# Patient Record
Sex: Female | Born: 1940 | Race: White | Hispanic: No | Smoking: Never smoker
Health system: Southern US, Community
[De-identification: ages and names within clinical notes are randomized; demographics above are authoritative.]

## PROBLEM LIST (undated history)

## (undated) DIAGNOSIS — F0390 Unspecified dementia without behavioral disturbance: Secondary | ICD-10-CM

## (undated) DIAGNOSIS — T7840XA Allergy, unspecified, initial encounter: Secondary | ICD-10-CM

## (undated) DIAGNOSIS — M1990 Unspecified osteoarthritis, unspecified site: Secondary | ICD-10-CM

## (undated) DIAGNOSIS — F419 Anxiety disorder, unspecified: Secondary | ICD-10-CM

## (undated) DIAGNOSIS — K219 Gastro-esophageal reflux disease without esophagitis: Secondary | ICD-10-CM

## (undated) HISTORY — PX: ANKLE SURGERY: SHX546

## (undated) HISTORY — PX: COLONOSCOPY: SHX5424

## (undated) HISTORY — DX: Allergy, unspecified, initial encounter: T78.40XA

## (undated) HISTORY — DX: Anxiety disorder, unspecified: F41.9

## (undated) HISTORY — DX: Unspecified osteoarthritis, unspecified site: M19.90

## (undated) HISTORY — PX: ABDOMINAL HYSTERECTOMY: SHX81

## (undated) HISTORY — PX: MULTIPLE TOOTH EXTRACTIONS: SHX2053

## (undated) HISTORY — DX: Gastro-esophageal reflux disease without esophagitis: K21.9

---

## 2012-04-05 DEATH — deceased

## 2013-09-28 DIAGNOSIS — F331 Major depressive disorder, recurrent, moderate: Secondary | ICD-10-CM | POA: Diagnosis not present

## 2013-09-28 DIAGNOSIS — F411 Generalized anxiety disorder: Secondary | ICD-10-CM | POA: Diagnosis not present

## 2014-03-06 DIAGNOSIS — F411 Generalized anxiety disorder: Secondary | ICD-10-CM | POA: Diagnosis not present

## 2014-03-06 DIAGNOSIS — F331 Major depressive disorder, recurrent, moderate: Secondary | ICD-10-CM | POA: Diagnosis not present

## 2014-06-14 DIAGNOSIS — F411 Generalized anxiety disorder: Secondary | ICD-10-CM | POA: Diagnosis not present

## 2014-06-14 DIAGNOSIS — F331 Major depressive disorder, recurrent, moderate: Secondary | ICD-10-CM | POA: Diagnosis not present

## 2014-10-25 DIAGNOSIS — F411 Generalized anxiety disorder: Secondary | ICD-10-CM | POA: Diagnosis not present

## 2014-10-25 DIAGNOSIS — F331 Major depressive disorder, recurrent, moderate: Secondary | ICD-10-CM | POA: Diagnosis not present

## 2015-03-24 ENCOUNTER — Other Ambulatory Visit: Payer: Self-pay | Admitting: Physician Assistant

## 2015-03-24 ENCOUNTER — Ambulatory Visit (INDEPENDENT_AMBULATORY_CARE_PROVIDER_SITE_OTHER): Payer: Medicare Other | Admitting: Physician Assistant

## 2015-03-24 VITALS — BP 110/60 | HR 81 | Temp 97.3°F | Resp 17 | Ht 59.25 in | Wt 155.0 lb

## 2015-03-24 DIAGNOSIS — Z8659 Personal history of other mental and behavioral disorders: Secondary | ICD-10-CM | POA: Diagnosis not present

## 2015-03-24 DIAGNOSIS — Z9149 Other personal history of psychological trauma, not elsewhere classified: Secondary | ICD-10-CM

## 2015-03-24 DIAGNOSIS — G4700 Insomnia, unspecified: Secondary | ICD-10-CM

## 2015-03-24 DIAGNOSIS — IMO0002 Reserved for concepts with insufficient information to code with codable children: Secondary | ICD-10-CM

## 2015-03-24 MED ORDER — PAROXETINE HCL 20 MG PO TABS: 20.0000 mg | ORAL_TABLET | Freq: Every day | ORAL | Status: DC

## 2015-03-24 MED ORDER — DIAZEPAM 5 MG PO TABS: 5.0000 mg | ORAL_TABLET | Freq: Every day | ORAL | Status: DC
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
## 2015-03-25 LAB — CBC
HEMATOCRIT: 43.3 % (ref 36.0–46.0)
HEMOGLOBIN: 14.6 g/dL (ref 12.0–15.0)
MCH: 29.7 pg (ref 26.0–34.0)
MCHC: 33.7 g/dL (ref 30.0–36.0)
MCV: 88.2 fL (ref 78.0–100.0)
MPV: 9.3 fL (ref 8.6–12.4)
PLATELETS: 368 10*3/uL (ref 150–400)
RBC: 4.91 MIL/uL (ref 3.87–5.11)
RDW: 14.4 % (ref 11.5–15.5)
WBC: 10.3 10*3/uL (ref 4.0–10.5)

## 2015-03-25 LAB — COMPLETE METABOLIC PANEL WITH GFR
ALT: 15 U/L (ref 6–29)
AST: 26 U/L (ref 10–35)
Albumin: 4.7 g/dL (ref 3.6–5.1)
Alkaline Phosphatase: 63 U/L (ref 33–130)
BILIRUBIN TOTAL: 0.5 mg/dL (ref 0.2–1.2)
BUN: 22 mg/dL (ref 7–25)
CALCIUM: 10.4 mg/dL (ref 8.6–10.4)
CHLORIDE: 102 mmol/L (ref 98–110)
CO2: 27 mmol/L (ref 20–31)
CREATININE: 0.97 mg/dL — AB (ref 0.60–0.93)
GFR, EST AFRICAN AMERICAN: 66 mL/min (ref >=60)
GFR, Est Non African American: 57 mL/min — ABNORMAL LOW (ref >=60)
Glucose, Bld: 166 mg/dL — ABNORMAL HIGH (ref 65–99)
Potassium: 4.9 mmol/L (ref 3.5–5.3)
Sodium: 140 mmol/L (ref 135–146)
TOTAL PROTEIN: 7.3 g/dL (ref 6.1–8.1)

## 2015-03-25 LAB — TSH: TSH: 1.168 u[IU]/mL (ref 0.350–4.500)

## 2015-03-26 LAB — HEMOGLOBIN A1C
Hgb A1c MFr Bld: 5.9 % — ABNORMAL HIGH (ref <5.7)
Mean Plasma Glucose: 123 mg/dL — ABNORMAL HIGH (ref <117)

## 2015-04-03 ENCOUNTER — Other Ambulatory Visit: Payer: Self-pay | Admitting: Physician Assistant

## 2015-04-07 ENCOUNTER — Telehealth: Payer: Self-pay | Admitting: Physician Assistant

## 2015-04-07 NOTE — Telephone Encounter (Signed)
Patient request a refill of Valium 5 MG. Patient is doubling up on the medication. Patient have a follow up appointment with Philis Fendt. [facility] [address]. [phone]

## 2015-04-08 NOTE — Telephone Encounter (Signed)
History of depression:  - Discontinue: PARoxetine (PAXIL) 20 MG tablet; Take 1 tablet (20 mg total) by mouth at bedtime. - Ambulatory referral to Psychiatry  Legrand Como I am not sure if it was ok for pt to double up on her medication. Your notes do not support that. Please advise.

## 2015-04-08 NOTE — Telephone Encounter (Signed)
Please call patient back.  I will refill her Valium tonight while I am at the office.  We need to try and wean her off of the valium and try Belsomra. I will refill the Valium 5 mg and this is to be taken during the day so it will not interfere starting Belsomra.  I was hoping that she would have gotten in with psychiatry by now.

## 2015-04-09 ENCOUNTER — Other Ambulatory Visit: Payer: Self-pay | Admitting: Physician Assistant

## 2015-04-09 DIAGNOSIS — G4700 Insomnia, unspecified: Secondary | ICD-10-CM

## 2015-04-09 MED ORDER — DIAZEPAM 5 MG PO TABS: ORAL_TABLET | ORAL | Status: DC

## 2015-04-09 MED ORDER — SUVOREXANT 15 MG PO TABS: 15.0000 mg | ORAL_TABLET | Freq: Every day | ORAL | Status: DC

## 2015-04-09 NOTE — Telephone Encounter (Signed)
Left message for pt to call back  °

## 2015-04-09 NOTE — Progress Notes (Signed)
Stopping Valium, starting Belsomra.

## 2015-04-09 NOTE — Telephone Encounter (Signed)
Patient's grandson, Miguel Aschoff, returned phone call. There is no HIPPA on file therefore I could not disclose any information to patient's grandson.   [phone]

## 2015-04-12 NOTE — Telephone Encounter (Signed)
Part D does not cover patients medication Balsama and diazepam (VALIUM) 5 MG tablet   [phone]  [name] son

## 2015-04-15 ENCOUNTER — Other Ambulatory Visit: Payer: Self-pay | Admitting: Physician Assistant

## 2015-04-15 MED ORDER — ESZOPICLONE 1 MG PO TABS: 1.0000 mg | ORAL_TABLET | Freq: Every day | ORAL | Status: DC

## 2015-04-15 NOTE — Progress Notes (Signed)
Insurance will not pay for Belsomra.  Will write for Lunesta.  Philis Fendt, MS, PA-C   8:46 AM, 04/15/2015

## 2015-04-15 NOTE — Telephone Encounter (Signed)
Please call patient back. I have written for Lunesta and will be by 102 to sign this around 1 pm.  Stopping the Valium and Belsomra for now.  Philis Fendt, MS, PA-C   8:59 AM, 04/15/2015

## 2015-04-15 NOTE — Telephone Encounter (Signed)
Pt's son notified and rx faxed to pharm

## 2015-04-24 ENCOUNTER — Ambulatory Visit: Payer: Medicare Other | Admitting: Physician Assistant

## 2015-05-06 DIAGNOSIS — F3342 Major depressive disorder, recurrent, in full remission: Secondary | ICD-10-CM | POA: Diagnosis not present

## 2015-06-17 DIAGNOSIS — F3342 Major depressive disorder, recurrent, in full remission: Secondary | ICD-10-CM | POA: Diagnosis not present

## 2015-09-10 DIAGNOSIS — M1711 Unilateral primary osteoarthritis, right knee: Secondary | ICD-10-CM | POA: Diagnosis not present

## 2015-09-10 DIAGNOSIS — M7062 Trochanteric bursitis, left hip: Secondary | ICD-10-CM | POA: Diagnosis not present

## 2015-10-14 DIAGNOSIS — F3342 Major depressive disorder, recurrent, in full remission: Secondary | ICD-10-CM | POA: Diagnosis not present

## 2015-11-05 DIAGNOSIS — M25511 Pain in right shoulder: Secondary | ICD-10-CM | POA: Diagnosis not present

## 2015-11-18 DIAGNOSIS — M25511 Pain in right shoulder: Secondary | ICD-10-CM | POA: Diagnosis not present

## 2015-11-27 DIAGNOSIS — M25511 Pain in right shoulder: Secondary | ICD-10-CM | POA: Diagnosis not present

## 2015-12-04 DIAGNOSIS — M25511 Pain in right shoulder: Secondary | ICD-10-CM | POA: Diagnosis not present

## 2016-01-01 DIAGNOSIS — M25511 Pain in right shoulder: Secondary | ICD-10-CM | POA: Diagnosis not present

## 2016-01-14 DIAGNOSIS — M1711 Unilateral primary osteoarthritis, right knee: Secondary | ICD-10-CM | POA: Diagnosis not present

## 2016-04-03 DIAGNOSIS — F3342 Major depressive disorder, recurrent, in full remission: Secondary | ICD-10-CM | POA: Diagnosis not present

## 2016-04-07 ENCOUNTER — Encounter (HOSPITAL_COMMUNITY): Payer: Self-pay

## 2016-04-07 ENCOUNTER — Encounter (HOSPITAL_COMMUNITY)
Admission: RE | Admit: 2016-04-07 | Discharge: 2016-04-07 | Disposition: A | Discharge status: Home / Self Care | Payer: Medicare Other | Source: Ambulatory Visit | Attending: Orthopedic Surgery | Admitting: Orthopedic Surgery

## 2016-04-07 DIAGNOSIS — Z79899 Other long term (current) drug therapy: Secondary | ICD-10-CM | POA: Diagnosis not present

## 2016-04-07 DIAGNOSIS — Z90710 Acquired absence of both cervix and uterus: Secondary | ICD-10-CM | POA: Diagnosis not present

## 2016-04-07 DIAGNOSIS — M129 Arthropathy, unspecified: Secondary | ICD-10-CM | POA: Diagnosis not present

## 2016-04-07 DIAGNOSIS — M75101 Unspecified rotator cuff tear or rupture of right shoulder, not specified as traumatic: Secondary | ICD-10-CM | POA: Diagnosis not present

## 2016-04-07 LAB — SURGICAL PCR SCREEN
MRSA, PCR: NEGATIVE
Staphylococcus aureus: POSITIVE — AB

## 2016-04-07 LAB — BASIC METABOLIC PANEL
ANION GAP: 8 (ref 5–15)
BUN: 19 mg/dL (ref 6–20)
CHLORIDE: 104 mmol/L (ref 101–111)
CO2: 27 mmol/L (ref 22–32)
Calcium: 9.4 mg/dL (ref 8.9–10.3)
Creatinine, Ser: 0.83 mg/dL (ref 0.44–1.00)
GFR calc non Af Amer: >60 mL/min (ref >60)
Glucose, Bld: 97 mg/dL (ref 65–99)
POTASSIUM: 3.7 mmol/L (ref 3.5–5.1)
SODIUM: 139 mmol/L (ref 135–145)

## 2016-04-07 LAB — CBC
HEMATOCRIT: 39.8 % (ref 36.0–46.0)
HEMOGLOBIN: 12.7 g/dL (ref 12.0–15.0)
MCH: 29.1 pg (ref 26.0–34.0)
MCHC: 31.9 g/dL (ref 30.0–36.0)
MCV: 91.1 fL (ref 78.0–100.0)
PLATELETS: 265 10*3/uL (ref 150–400)
RBC: 4.37 MIL/uL (ref 3.87–5.11)
RDW: 13.8 % (ref 11.5–15.5)
WBC: 8.3 10*3/uL (ref 4.0–10.5)

## 2016-04-07 NOTE — Pre-Procedure Instructions (Signed)
Lenola A Cannell  04/07/2016      [facility], [address] [place] [address] Phone: [phone] Fax: [phone]    Your procedure is scheduled on October 5  Report to [facility] at [place].M.  Call this number if you have problems the morning of surgery:  [phone]   Remember:  Do not eat food or drink liquids after midnight.   Take these medicines the morning of surgery with A SIP OF WATER busPIRone (BUSPAR) If needed  7 days prior to surgery STOP taking any Aspirin, Aleve, Naproxen, Ibuprofen, Motrin, Advil, Goody's, BC's, all herbal medications, fish oil, and all vitamins    Do not wear jewelry, make-up or nail polish.  Do not wear lotions, powders, or perfumes, or deoderant.  Do not shave 48 hours prior to surgery.    Do not bring valuables to the hospital.  [facility] is not responsible for any belongings or valuables.  Contacts, dentures or bridgework may not be worn into surgery.  Leave your suitcase in the car.  After surgery it may be brought to your room.  For patients admitted to the hospital, discharge time will be determined by your treatment team.  Patients discharged the day of surgery will not be allowed to drive home.    Special instructions:   [facility]- Preparing For Surgery  Before surgery, you can play an important role. Because skin is not sterile, your skin needs to be as free of germs as possible. You can reduce the number of germs on your skin by washing with CHG (chlorahexidine gluconate) Soap before surgery.  CHG is an antiseptic cleaner which kills germs and bonds with the skin to continue killing germs even after washing.  Please do not use if you have an allergy to CHG or antibacterial soaps. If your skin becomes reddened/irritated stop using the CHG.  Do not shave (including legs and underarms) for at least 48 hours prior to first CHG  shower. It is OK to shave your face.  Please follow these instructions carefully.   1. Shower the NIGHT BEFORE SURGERY and the MORNING OF SURGERY with CHG.   2. If you chose to wash your hair, wash your hair first as usual with your normal shampoo.  3. After you shampoo, rinse your hair and body thoroughly to remove the shampoo.  4. Use CHG as you would any other liquid soap. You can apply CHG directly to the skin and wash gently with a scrungie or a clean washcloth.   5. Apply the CHG Soap to your body ONLY FROM THE NECK DOWN.  Do not use on open wounds or open sores. Avoid contact with your eyes, ears, mouth and genitals (private parts). Wash genitals (private parts) with your normal soap.  6. Wash thoroughly, paying special attention to the area where your surgery will be performed.  7. Thoroughly rinse your body with warm water from the neck down.  8. DO NOT shower/wash with your normal soap after using and rinsing off the CHG Soap.  9. Pat yourself dry with a CLEAN TOWEL.   10. Wear CLEAN PAJAMAS   11. Place CLEAN SHEETS on your bed the night of your first shower and DO NOT SLEEP WITH PETS.    Day of Surgery: Do not apply any deodorants/lotions. Please wear clean clothes to the hospital/surgery center.      Please read over the following  fact sheets that you were given. Coughing and Deep Breathing, Total Joint Packet, MRSA Information and Surgical Site Infection Prevention

## 2016-04-07 NOTE — Progress Notes (Signed)
Prescription called in at [facility] [phone], patient called and notified of positive staph result

## 2016-04-07 NOTE — Progress Notes (Signed)
PCP - denies Cardiologist -denies   Chest x-ray - not needed EKG - 04/07/16 Stress Test - denies ECHO - denies Cardiac Cath - denies    Patient denies shortness of breath, fever, cough and chest pain at PAT appointment

## 2016-04-08 MED ORDER — SODIUM CHLORIDE 0.9 % IV SOLN
1000.0000 mg | INTRAVENOUS | Status: AC
  Administered 2016-04-09: 1000 mg via INTRAVENOUS
  Filled 2016-04-08 (×2): qty 10

## 2016-04-08 NOTE — Anesthesia Preprocedure Evaluation (Signed)
Anesthesia Evaluation  Patient identified by MRN, date of birth, ID band Patient awake    Reviewed: Allergy & Precautions, NPO status , Patient's Chart, lab work & pertinent test results  Airway Mallampati: II  TM Distance: >3 FB Neck ROM: Full    Dental no notable dental hx.    Pulmonary neg pulmonary ROS,    Pulmonary exam normal breath sounds clear to auscultation       Cardiovascular negative cardio ROS Normal cardiovascular exam Rhythm:Regular Rate:Normal     Neuro/Psych PSYCHIATRIC DISORDERS Anxiety negative neurological ROS     GI/Hepatic Neg liver ROS, GERD  ,  Endo/Other  negative endocrine ROS  Renal/GU negative Renal ROS     Musculoskeletal  (+) Arthritis ,   Abdominal   Peds  Hematology negative hematology ROS (+)   Anesthesia Other Findings   Reproductive/Obstetrics negative OB ROS                             Anesthesia Physical Anesthesia Plan  ASA: II  Anesthesia Plan: General and Regional   Post-op Pain Management: GA combined w/ Regional for post-op pain   Induction: Intravenous  Airway Management Planned: Oral ETT  Additional Equipment:   Intra-op Plan:   Post-operative Plan: Extubation in OR  Informed Consent: I have reviewed the patients History and Physical, chart, labs and discussed the procedure including the risks, benefits and alternatives for the proposed anesthesia with the patient or authorized representative who has indicated his/her understanding and acceptance.   Dental advisory given  Plan Discussed with: CRNA  Anesthesia Plan Comments:         Anesthesia Quick Evaluation

## 2016-04-09 ENCOUNTER — Encounter (HOSPITAL_COMMUNITY)
Admission: RE | Disposition: A | Discharge status: Home / Self Care | Payer: Self-pay | Source: Ambulatory Visit | Attending: Orthopedic Surgery

## 2016-04-09 ENCOUNTER — Inpatient Hospital Stay (HOSPITAL_COMMUNITY): Payer: Medicare Other | Admitting: Anesthesiology

## 2016-04-09 ENCOUNTER — Inpatient Hospital Stay (HOSPITAL_COMMUNITY)
Admission: RE | Admit: 2016-04-09 | Discharge: 2016-04-10 | DRG: 483 | Disposition: A | Discharge status: Home / Self Care | Payer: Medicare Other | Source: Ambulatory Visit | Attending: Orthopedic Surgery | Admitting: Orthopedic Surgery

## 2016-04-09 ENCOUNTER — Encounter (HOSPITAL_COMMUNITY): Payer: Self-pay | Admitting: *Deleted

## 2016-04-09 DIAGNOSIS — Z79899 Other long term (current) drug therapy: Secondary | ICD-10-CM | POA: Diagnosis not present

## 2016-04-09 DIAGNOSIS — M129 Arthropathy, unspecified: Secondary | ICD-10-CM | POA: Diagnosis not present

## 2016-04-09 DIAGNOSIS — K219 Gastro-esophageal reflux disease without esophagitis: Secondary | ICD-10-CM | POA: Diagnosis not present

## 2016-04-09 DIAGNOSIS — Z96611 Presence of right artificial shoulder joint: Secondary | ICD-10-CM

## 2016-04-09 DIAGNOSIS — M75101 Unspecified rotator cuff tear or rupture of right shoulder, not specified as traumatic: Principal | ICD-10-CM | POA: Diagnosis present

## 2016-04-09 DIAGNOSIS — M12811 Other specific arthropathies, not elsewhere classified, right shoulder: Secondary | ICD-10-CM | POA: Diagnosis not present

## 2016-04-09 DIAGNOSIS — Z90710 Acquired absence of both cervix and uterus: Secondary | ICD-10-CM

## 2016-04-09 DIAGNOSIS — M1990 Unspecified osteoarthritis, unspecified site: Secondary | ICD-10-CM | POA: Diagnosis not present

## 2016-04-09 HISTORY — PX: REVERSE SHOULDER ARTHROPLASTY: SHX5054

## 2016-04-09 SURGERY — ARTHROPLASTY, SHOULDER, TOTAL, REVERSE
Anesthesia: Regional | Site: Shoulder | Laterality: Right

## 2016-04-09 MED ORDER — MENTHOL 3 MG MT LOZG: 1.0000 | LOZENGE | OROMUCOSAL | Status: DC | PRN

## 2016-04-09 MED ORDER — FENTANYL CITRATE (PF) 100 MCG/2ML IJ SOLN
INTRAMUSCULAR | Status: AC
  Filled 2016-04-09: qty 4

## 2016-04-09 MED ORDER — METOCLOPRAMIDE HCL 5 MG PO TABS: 5.0000 mg | ORAL_TABLET | Freq: Three times a day (TID) | ORAL | Status: DC | PRN

## 2016-04-09 MED ORDER — BUPIVACAINE-EPINEPHRINE (PF) 0.5% -1:200000 IJ SOLN
INTRAMUSCULAR | Status: DC | PRN
  Administered 2016-04-09: 30 mL via PERINEURAL

## 2016-04-09 MED ORDER — KETOROLAC TROMETHAMINE 15 MG/ML IJ SOLN
7.5000 mg | Freq: Four times a day (QID) | INTRAMUSCULAR | Status: AC
  Administered 2016-04-09 – 2016-04-10 (×4): 7.5 mg via INTRAVENOUS
  Filled 2016-04-09 (×4): qty 1

## 2016-04-09 MED ORDER — ALUM & MAG HYDROXIDE-SIMETH 200-200-20 MG/5ML PO SUSP: 30.0000 mL | ORAL | Status: DC | PRN

## 2016-04-09 MED ORDER — POLYETHYLENE GLYCOL 3350 17 G PO PACK: 17.0000 g | PACK | Freq: Every day | ORAL | Status: DC | PRN

## 2016-04-09 MED ORDER — CEFAZOLIN SODIUM-DEXTROSE 2-4 GM/100ML-% IV SOLN
2.0000 g | INTRAVENOUS | Status: AC
  Administered 2016-04-09: 2 g via INTRAVENOUS

## 2016-04-09 MED ORDER — ONDANSETRON HCL 4 MG PO TABS: 4.0000 mg | ORAL_TABLET | Freq: Four times a day (QID) | ORAL | Status: DC | PRN

## 2016-04-09 MED ORDER — EPHEDRINE SULFATE 50 MG/ML IJ SOLN
INTRAMUSCULAR | Status: DC | PRN
Start: 2016-04-09 — End: 2016-04-09
  Administered 2016-04-09: 5 mg via INTRAVENOUS

## 2016-04-09 MED ORDER — ACETAMINOPHEN 325 MG PO TABS: 650.0000 mg | ORAL_TABLET | Freq: Four times a day (QID) | ORAL | Status: DC | PRN

## 2016-04-09 MED ORDER — CEFAZOLIN SODIUM-DEXTROSE 2-4 GM/100ML-% IV SOLN
2.0000 g | Freq: Four times a day (QID) | INTRAVENOUS | Status: AC
  Administered 2016-04-09 – 2016-04-10 (×3): 2 g via INTRAVENOUS
  Filled 2016-04-09 (×4): qty 100

## 2016-04-09 MED ORDER — FENTANYL CITRATE (PF) 100 MCG/2ML IJ SOLN
INTRAMUSCULAR | Status: DC | PRN
  Administered 2016-04-09: 50 ug via INTRAVENOUS

## 2016-04-09 MED ORDER — PAROXETINE HCL 20 MG PO TABS
20.0000 mg | ORAL_TABLET | Freq: Every evening | ORAL | Status: DC
  Administered 2016-04-09: 20 mg via ORAL
  Filled 2016-04-09: qty 1

## 2016-04-09 MED ORDER — PROPOFOL 10 MG/ML IV BOLUS
INTRAVENOUS | Status: AC
  Filled 2016-04-09: qty 20

## 2016-04-09 MED ORDER — OXYCODONE HCL 5 MG PO TABS
5.0000 mg | ORAL_TABLET | ORAL | Status: DC | PRN
  Administered 2016-04-10 (×2): 10 mg via ORAL
  Filled 2016-04-09 (×2): qty 2

## 2016-04-09 MED ORDER — NEOSTIGMINE METHYLSULFATE 10 MG/10ML IV SOLN
INTRAVENOUS | Status: DC | PRN
  Administered 2016-04-09: 4 mg via INTRAVENOUS

## 2016-04-09 MED ORDER — 0.9 % SODIUM CHLORIDE (POUR BTL) OPTIME
TOPICAL | Status: DC | PRN
  Administered 2016-04-09: 1000 mL

## 2016-04-09 MED ORDER — GLYCOPYRROLATE 0.2 MG/ML IJ SOLN
INTRAMUSCULAR | Status: DC | PRN
  Administered 2016-04-09: 0.6 mg via INTRAVENOUS

## 2016-04-09 MED ORDER — PHENYLEPHRINE HCL 10 MG/ML IJ SOLN
INTRAVENOUS | Status: DC | PRN
  Administered 2016-04-09: 25 ug/min via INTRAVENOUS

## 2016-04-09 MED ORDER — ROCURONIUM BROMIDE 100 MG/10ML IV SOLN
INTRAVENOUS | Status: DC | PRN
  Administered 2016-04-09: 50 mg via INTRAVENOUS

## 2016-04-09 MED ORDER — DOCUSATE SODIUM 100 MG PO CAPS
100.0000 mg | ORAL_CAPSULE | Freq: Two times a day (BID) | ORAL | Status: DC
  Administered 2016-04-09 – 2016-04-10 (×2): 100 mg via ORAL
  Filled 2016-04-09 (×3): qty 1

## 2016-04-09 MED ORDER — CHLORHEXIDINE GLUCONATE 4 % EX LIQD: 60.0000 mL | Freq: Once | CUTANEOUS | Status: DC

## 2016-04-09 MED ORDER — DEXAMETHASONE SODIUM PHOSPHATE 10 MG/ML IJ SOLN
INTRAMUSCULAR | Status: DC | PRN
  Administered 2016-04-09: 10 mg via INTRAVENOUS

## 2016-04-09 MED ORDER — METOCLOPRAMIDE HCL 5 MG/ML IJ SOLN: 5.0000 mg | Freq: Three times a day (TID) | INTRAMUSCULAR | Status: DC | PRN

## 2016-04-09 MED ORDER — MAGNESIUM CITRATE PO SOLN: 1.0000 | Freq: Once | ORAL | Status: DC | PRN

## 2016-04-09 MED ORDER — ACETAMINOPHEN 650 MG RE SUPP: 650.0000 mg | Freq: Four times a day (QID) | RECTAL | Status: DC | PRN

## 2016-04-09 MED ORDER — BUSPIRONE HCL 10 MG PO TABS: 10.0000 mg | ORAL_TABLET | Freq: Three times a day (TID) | ORAL | Status: DC | PRN

## 2016-04-09 MED ORDER — CEFAZOLIN SODIUM-DEXTROSE 2-4 GM/100ML-% IV SOLN
INTRAVENOUS | Status: AC
  Filled 2016-04-09: qty 100

## 2016-04-09 MED ORDER — LACTATED RINGERS IV SOLN
INTRAVENOUS | Status: DC | PRN
  Administered 2016-04-09: 07:00:00 via INTRAVENOUS

## 2016-04-09 MED ORDER — LACTATED RINGERS IV SOLN
INTRAVENOUS | Status: DC
  Administered 2016-04-09: 13:00:00 via INTRAVENOUS

## 2016-04-09 MED ORDER — HYDROMORPHONE HCL 1 MG/ML IJ SOLN: 1.0000 mg | INTRAMUSCULAR | Status: DC | PRN

## 2016-04-09 MED ORDER — ONDANSETRON HCL 4 MG/2ML IJ SOLN
INTRAMUSCULAR | Status: DC | PRN
  Administered 2016-04-09: 4 mg via INTRAVENOUS

## 2016-04-09 MED ORDER — BISACODYL 5 MG PO TBEC: 5.0000 mg | DELAYED_RELEASE_TABLET | Freq: Every day | ORAL | Status: DC | PRN

## 2016-04-09 MED ORDER — DIAZEPAM 5 MG PO TABS
5.0000 mg | ORAL_TABLET | Freq: Four times a day (QID) | ORAL | Status: DC | PRN
  Administered 2016-04-10: 5 mg via ORAL
  Filled 2016-04-09: qty 1

## 2016-04-09 MED ORDER — PHENOL 1.4 % MT LIQD: 1.0000 | OROMUCOSAL | Status: DC | PRN

## 2016-04-09 MED ORDER — PROPOFOL 10 MG/ML IV BOLUS
INTRAVENOUS | Status: DC | PRN
  Administered 2016-04-09: 150 mg via INTRAVENOUS

## 2016-04-09 MED ORDER — MIDAZOLAM HCL 5 MG/5ML IJ SOLN
INTRAMUSCULAR | Status: DC | PRN
  Administered 2016-04-09 (×2): 1 mg via INTRAVENOUS

## 2016-04-09 MED ORDER — ONDANSETRON HCL 4 MG/2ML IJ SOLN: 4.0000 mg | Freq: Four times a day (QID) | INTRAMUSCULAR | Status: DC | PRN

## 2016-04-09 MED ORDER — MIDAZOLAM HCL 2 MG/2ML IJ SOLN
INTRAMUSCULAR | Status: AC
  Filled 2016-04-09: qty 2

## 2016-04-09 SURGICAL SUPPLY — 66 items
BASEPLATE GLENOID SHLDR SM (Shoulder) ×3 IMPLANT
BLADE SAW SGTL 83.5X18.5 (BLADE) ×3 IMPLANT
COVER SURGICAL LIGHT HANDLE (MISCELLANEOUS) ×3 IMPLANT
CUP SUT UNIV REVERS 36+2 RT (Cup) ×3 IMPLANT
DERMABOND ADVANCED (GAUZE/BANDAGES/DRESSINGS) ×2
DERMABOND ADVANCED .7 DNX12 (GAUZE/BANDAGES/DRESSINGS) ×1 IMPLANT
DRAPE ORTHO SPLIT 77X108 STRL (DRAPES) ×4
DRAPE SURG 17X11 SM STRL (DRAPES) ×3 IMPLANT
DRAPE SURG ORHT 6 SPLT 77X108 (DRAPES) ×2 IMPLANT
DRAPE U-SHAPE 47X51 STRL (DRAPES) ×3 IMPLANT
DRSG AQUACEL AG ADV 3.5X10 (GAUZE/BANDAGES/DRESSINGS) ×3 IMPLANT
DURAPREP 26ML APPLICATOR (WOUND CARE) ×3 IMPLANT
ELECT BLADE 4.0 EZ CLEAN MEGAD (MISCELLANEOUS) ×3
ELECT CAUTERY BLADE 6.4 (BLADE) ×3 IMPLANT
ELECT REM PT RETURN 9FT ADLT (ELECTROSURGICAL) ×3
ELECTRODE BLDE 4.0 EZ CLN MEGD (MISCELLANEOUS) ×1 IMPLANT
ELECTRODE REM PT RTRN 9FT ADLT (ELECTROSURGICAL) ×1 IMPLANT
FACESHIELD WRAPAROUND (MASK) ×9 IMPLANT
GLENOSPHERE LATERAL 36MM+4 (Shoulder) ×3 IMPLANT
GLOVE BIO SURGEON STRL SZ7.5 (GLOVE) ×3 IMPLANT
GLOVE BIO SURGEON STRL SZ8 (GLOVE) ×3 IMPLANT
GLOVE EUDERMIC 7 POWDERFREE (GLOVE) ×3 IMPLANT
GLOVE SS BIOGEL STRL SZ 7.5 (GLOVE) ×1 IMPLANT
GLOVE SUPERSENSE BIOGEL SZ 7.5 (GLOVE) ×2
GOWN STRL REUS W/ TWL LRG LVL3 (GOWN DISPOSABLE) IMPLANT
GOWN STRL REUS W/ TWL XL LVL3 (GOWN DISPOSABLE) ×2 IMPLANT
GOWN STRL REUS W/TWL LRG LVL3 (GOWN DISPOSABLE)
GOWN STRL REUS W/TWL XL LVL3 (GOWN DISPOSABLE) ×4
KIT BASIN OR (CUSTOM PROCEDURE TRAY) ×3 IMPLANT
KIT ROOM TURNOVER OR (KITS) ×3 IMPLANT
LINER HUMERAL 36 +3MM SM (Shoulder) ×3 IMPLANT
MANIFOLD NEPTUNE II (INSTRUMENTS) ×3 IMPLANT
NDL SUT .5 MAYO 1.404X.05X (NEEDLE) ×1 IMPLANT
NEEDLE HYPO 25GX1X1/2 BEV (NEEDLE) IMPLANT
NEEDLE MAYO TAPER (NEEDLE) ×2
NEEDLE MAYO TROCAR (NEEDLE) ×3 IMPLANT
NS IRRIG 1000ML POUR BTL (IV SOLUTION) ×3 IMPLANT
PACK SHOULDER (CUSTOM PROCEDURE TRAY) ×3 IMPLANT
PAD ARMBOARD 7.5X6 YLW CONV (MISCELLANEOUS) ×6 IMPLANT
PASSER SUT SWANSON 36MM LOOP (INSTRUMENTS) IMPLANT
RESTRAINT HEAD UNIVERSAL NS (MISCELLANEOUS) ×3 IMPLANT
SCREW CENTRAL NONLOCK 6.5X20MM (Shoulder) ×3 IMPLANT
SCREW LOCK GLENOID UNI PERI (Screw) ×3 IMPLANT
SCREW LOCK PERIPHERAL 30MM (Shoulder) ×3 IMPLANT
SET PIN UNIVERSAL REVERSE (SET/KITS/TRAYS/PACK) ×3 IMPLANT
SLING ARM FOAM STRAP LRG (SOFTGOODS) ×3 IMPLANT
SPACER SHLD UNI REV 36 +6 (Shoulder) ×2 IMPLANT
SPACER TI 36/+6MM (Shoulder) ×1 IMPLANT
SPONGE LAP 18X18 X RAY DECT (DISPOSABLE) ×3 IMPLANT
SPONGE LAP 4X18 X RAY DECT (DISPOSABLE) ×3 IMPLANT
STEM REVERS UNIVERS SZ7 CAP (Shoulder) ×3 IMPLANT
SUCTION FRAZIER HANDLE 10FR (MISCELLANEOUS) ×2
SUCTION TUBE FRAZIER 10FR DISP (MISCELLANEOUS) ×1 IMPLANT
SUT BONE WAX W31G (SUTURE) IMPLANT
SUT FIBERWIRE #2 38 T-5 BLUE (SUTURE) ×6
SUT MNCRL AB 3-0 PS2 18 (SUTURE) ×3 IMPLANT
SUT MON AB 2-0 CT1 36 (SUTURE) ×3 IMPLANT
SUT VIC AB 1 CT1 27 (SUTURE) ×2
SUT VIC AB 1 CT1 27XBRD ANBCTR (SUTURE) ×1 IMPLANT
SUT VIC AB 2-0 CT1 27 (SUTURE)
SUT VIC AB 2-0 CT1 TAPERPNT 27 (SUTURE) IMPLANT
SUTURE FIBERWR #2 38 T-5 BLUE (SUTURE) ×2 IMPLANT
SYR CONTROL 10ML LL (SYRINGE) IMPLANT
TOWEL OR 17X24 6PK STRL BLUE (TOWEL DISPOSABLE) ×3 IMPLANT
TOWEL OR 17X26 10 PK STRL BLUE (TOWEL DISPOSABLE) ×3 IMPLANT
WATER STERILE IRR 1000ML POUR (IV SOLUTION) IMPLANT

## 2016-04-09 NOTE — Anesthesia Postprocedure Evaluation (Signed)
Anesthesia Post Note  Patient: Courtney Avila  Procedure(s) Performed: Procedure(s) (LRB): RIGHT REVERSE SHOULDER ARTHROPLASTY (Right)  Patient location during evaluation: PACU Anesthesia Type: General and Regional Level of consciousness: sedated and patient cooperative Pain management: pain level controlled Vital Signs Assessment: post-procedure vital signs reviewed and stable Respiratory status: spontaneous breathing Cardiovascular status: stable Anesthetic complications: no    Last Vitals:  Vitals:   04/09/16 1056 04/09/16 1100  BP:  (!) 110/98  Pulse: 72 (!) 55  Resp: 12 16  Temp:  36.7 C    Last Pain:  Vitals:   04/09/16 1100  TempSrc:   PainSc: 0-No pain                 Nolon Nations

## 2016-04-09 NOTE — Anesthesia Procedure Notes (Signed)
Anesthesia Regional Block:  Interscalene brachial plexus block  Pre-Anesthetic Checklist: ,, timeout performed, Correct Patient, Correct Site, Correct Laterality, Correct Procedure, Correct Position, site marked, Risks and benefits discussed, Surgical consent,  Pre-op evaluation,  Post-op pain management  Laterality: Right  Prep: chloraprep       Needles:  Injection technique: Single-shot  Needle Type: Stimiplex      Needle Gauge: 21 G    Additional Needles:  Procedures: ultrasound guided (picture in chart) and nerve stimulator Interscalene brachial plexus block  Nerve Stimulator or Paresthesia:  Response: Deltoid, 0.5 mA,   Additional Responses:   Narrative:   Performed by: Personally   Additional Notes: Patient tolerated the procedure well without complications. Good perineural spread.

## 2016-04-09 NOTE — Transfer of Care (Signed)
Immediate Anesthesia Transfer of Care Note  Patient: Courtney Avila  Procedure(s) Performed: Procedure(s): RIGHT REVERSE SHOULDER ARTHROPLASTY (Right)  Patient Location: PACU  Anesthesia Type:GA combined with regional for post-op pain  Level of Consciousness: awake, alert , oriented and patient cooperative  Airway & Oxygen Therapy: Patient Spontanous Breathing and Patient connected to nasal cannula oxygen  Post-op Assessment: Report given to RN and Post -op Vital signs reviewed and stable  Post vital signs: Reviewed and stable  Last Vitals:  Vitals:   04/09/16 0625 04/09/16 0929  BP: (!) 159/61   Pulse: 66 86  Resp: 18   Temp: 36.5 C 36.6 C    Last Pain:  Vitals:   04/09/16 0625  TempSrc: Oral         Complications: No apparent anesthesia complications

## 2016-04-09 NOTE — H&P (Signed)
Auriel A Avitia    Chief Complaint: Right Shoulder Rotator Tear Arthropathy HPI: The patient is a 75 y.o. 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 Allergy   . Anxiety   . Arthritis   . GERD (gastroesophageal reflux disease)     Past Surgical History:  Procedure Laterality Date  . ABDOMINAL HYSTERECTOMY    . ANKLE SURGERY     growth lipoma removed  . BACK SURGERY    . COLONOSCOPY    . MULTIPLE TOOTH EXTRACTIONS      Family History  Problem Relation Age of Onset  . Diabetes Mother   . Hypertension Mother   . Cancer Father   . Heart disease Father   . Cancer Sister     Social History:  reports that she has never smoked. She has never used smokeless tobacco. She reports that she does not drink alcohol or use drugs.   Medications Prior to Admission  Medication Sig Dispense Refill  . busPIRone (BUSPAR) 10 MG tablet Take 10 mg by mouth 3 (three) times daily as needed (for anxiety).     . Multiple Vitamin (MULTIVITAMIN WITH MINERALS) TABS tablet Take 1 tablet by mouth daily. Centrum Silver.    . Multiple Vitamins-Minerals (HAIR/SKIN/NAILS/BIOTIN) TABS Take 1 tablet by mouth daily.    . Omega-3 Fatty Acids (FISH OIL) 1000 MG CAPS Take 1,000 mg by mouth daily.    Marland Kitchen PARoxetine (PAXIL) 20 MG tablet Take 20 mg by mouth every evening.     . temazepam (RESTORIL) 15 MG capsule Take 30 mg by mouth at bedtime.        Physical Exam: right shoulder with painful and restricted motion as noted at recent office visits  Vitals  Temp:  [97.7 F (36.5 C)] 97.7 F (36.5 C) (10/05 0625) Pulse Rate:  [66] 66 (10/05 0625) Resp:  [18] 18 (10/05 0625) BP: (159)/(61) 159/61 (10/05 0625) SpO2:  [97 %] 97 % (10/05 0625) Weight:  [69.9 kg (154 lb)] 69.9 kg (154 lb) (10/05 0625)  Assessment/Plan  Impression: Right Shoulder Rotator Tear Arthropathy  Plan of Action: Procedure(s): RIGHT REVERSE SHOULDER ARTHROPLASTY  Kayen Grabel M  Desarai Barrack 04/09/2016, 7:22 AM Contact # [phone]

## 2016-04-09 NOTE — Discharge Instructions (Signed)
° °  Metta Clines. Supple, M.D., F.A.A.O.S. Orthopaedic Surgery Specializing in Arthroscopic and Reconstructive Surgery of the Shoulder and Knee [phone] [address] [place], [place] [address] - Fax [phone]   POST-OP REVERSE SHOULDER REPLACEMENT INSTRUCTIONS  1. Call the office at [phone] to schedule your first post-op appointment 10-14 days from the date of your surgery.  2. The bandage over your incision is waterproof. You may begin showering with this dressing on. You may leave this dressing on until first follow up appointment within 2 weeks. We prefer you leave this dressing in place until follow up however after 5-7 days if you are having itching or skin irritation and would like to remove it you may do so. Go slow and tug at the borders gently to break the bond the dressing has with the skin. At this point if there is no drainage it is okay to go without a bandage or you may cover it with a light guaze and tape. You can also expect significant bruising around your shoulder that will drift down your arm and into your chest wall. This is very normal and should resolve over several days.   3. Wear your sling/immobilizer at all times except to perform the exercises below or to occasionally let your arm dangle by your side to stretch your elbow. You also need to sleep in your sling immobilizer until instructed otherwise.  4. Range of motion to your elbow, wrist, and hand are encouraged 3-5 times daily. Exercise to your hand and fingers helps to reduce swelling you may experience.  5. Utilize ice to the shoulder 3-5 times minimum a day and additionally if you are experiencing pain.  6. Prescriptions for a pain medication and a muscle relaxant are provided for you. It is recommended that if you are experiencing pain that you pain medication alone is not controlling, add the muscle relaxant along with the pain medication which can give additional pain relief. The first 1-2  days is generally the most severe of your pain and then should gradually decrease. As your pain lessens it is recommended that you decrease your use of the pain medications to an "as needed basis'" only and to always comply with the recommended dosages of the pain medications.  7. Pain medications can produce constipation along with their use. If you experience this, the use of an over the counter stool softener or laxative daily is recommended.   8. For most patients, if insurance allows, home health services to include therapy has been arranged.  9. For additional questions or concerns, please do not hesitate to call the office. If after hours there is an answering service to forward your concerns to the physician on call.  POST-OP EXERCISES  Pendulum Exercises  Perform pendulum exercises while standing and bending at the waist. Support your uninvolved arm on a table or chair and allow your operated arm to hang freely. Make sure to do these exercises passively - not using you shoulder muscles.  Repeat 20 times. Do 3 sessions per day.

## 2016-04-09 NOTE — Op Note (Signed)
Courtney Avila, Courtney Avila NO.:  [id number]  MEDICAL RECORD NO.:  [id number]  LOCATION:  5N08C                        FACILITY:  [facility]  PHYSICIAN:  Metta Clines. Vonnie Ligman, M.D.  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  PROCEDURE:  Right shoulder reverse arthroplasty utilizing a press-fit size 7 Arthrex stem with a +6 spacer, +3 poly and a 36 +4 glenosphere and a small baseplate.  SURGEON:  Metta Clines. Valor [place], M.D.  Terrence DupontOlivia Mackie A. Shuford, P.A.-C.  ANESTHESIA:  General endotracheal as well as interscalene block.  ESTIMATED BLOOD LOSS:  200 mL.  DRAINS:  None.  HISTORY:  Courtney Avila is a 75 year old female, who has had chronic, progressive, increasing right shoulder pain with functional limitations related to end-stage rotator cuff tear arthropathy.  Her symptoms have progressed despite prolonged attempts at conservative management, due to her increasing pain and functional limitations, she was brought to the operating room at this time for planned right shoulder reverse arthroplasty.  Preoperatively, I counseled Courtney Avila regarding treatment options, potential risks versus benefits thereof.  Possible surgical complications were all reviewed including bleeding, infection, neurovascular injury, persistent pain, loss of motion, anesthetic complication, failure of the implant and possible need for additional surgery.  She understands and accepts and agrees with our planned procedure.  PROCEDURE IN DETAIL:  After undergoing routine preop evaluation, the patient received prophylactic antibiotics and an interscalene block was established in the holding area by the Anesthesia Department.  Placed supine on the operative  table, underwent smooth induction of a general endotracheal anesthesia.  Placed in the beach-chair position and appropriately padded and protected.  The right shoulder girdle region was sterilely prepped and draped in standard fashion.  Time-out was called.  An anterior deltopectoral approach, the right shoulder was made through an 8-cm incision.  Skin flaps were elevated.  Dissection carried deeply, electrocautery was used for hemostasis.  The cephalic vein was taken laterally with the deltoid.  The interval was then developed bluntly, upper centimeter of the pec major tenotomized to enhance exposure.  Conjoined tendon was mobilized and retracted medially with self-retaining retractors placed.  The subdeltoid space was freed up and then, we divided the biceps tendon for later tenodesis and then separated the subscap away from the lesser tuberosity, tagged into free margin with grasping #2 FiberWire sutures.  The rotator cuff was deficient superiorly.  We divided the capsular attachments from the anterior-inferior and inferior aspects of the humeral neck allowing delivery of the humeral head through the wound.  At this point in time, we created a humeral head resection at 135-degree angle at approximately 10 degrees of retroversion.  I then placed a metal cap over the cut surface of the proximal humerus.  At this point, we then exposed the glenoid with combination of Fukuda, pitchfork and snake tongue retractors.  We gained circumferential visualization of the glenoid and performed a circumferential labral resection and removed the proximal stump of  the biceps.  A guidepin was then placed into the center of the glenoid.  We reamed with the series of reamers, preparing her bed for a small glenosphere with excellent bony contouring and fixation.  Our small baseplate was then impacted and positioned.  The central lag screw was placed by the superior and inferior locking screws, all of  which obtained excellent bony purchase and fixation.  We then inserted our 36+ 4 glenosphere onto the baseplate.  This was impacted, showing good stability.  We then returned our attention to the proximal humerus where we performed the hand reaming of the canal up to size 7, broached up to size 7 and then repaired the metaphysis with the proper reamer with the posterior offset.  Trial reduction showed good soft tissue balance.  At this point, we then assembled our final stem with the 7 stem, 36 metaphysis.  This was assembled and then impacted into position with excellent interference fit and fixation.  We then performed the series of trial reductions and ultimately felt that the total +9 construct gave [place] excellent soft tissue balance, good shoulder motion and stability. At this point, we then placed our +6 extension, +3 poly.  Final reduction was performed, again excellent soft tissue balance, good shoulder motion and stability.  The wound was copiously irrigated. Hemostasis was obtained.  The subscapularis was then repaired back to the metaphyseal region of the proximal humerus through the islets on her implant.  The biceps tendon was then tenodesed to the upper border of the pec major.  Again, irrigation was then completed.  Hemostasis was obtained.  Fluid and instruments were removed.  The deltopectoral interval was then reapproximated with series of figure-of-eight #1 Vicryl sutures.  2-0 Monocryl was used for the subcu layer, intracuticular 3-0 Monocryl for the skin followed by Dermabond, Aquacel dressing.  Right arm was placed into a sling.  The patient was awakened, extubated and taken to the recovery room in stable condition.  Courtney Loges, PA-C was used as an Environmental consultant throughout this case, essential for help with positioning the patient, positioning the extremity, management of the retractors, tissue manipulation, implantation of prosthesis, wound closure and intraoperative  decision making.     Metta Clines. Shantia Sanford, M.D.     KMS/MEDQ  D:  04/09/2016  T:  04/09/2016  Job:  [id number]

## 2016-04-09 NOTE — Addendum Note (Signed)
Addendum  created 04/09/16 1214 by Laretta Alstrom, CRNA   Anesthesia Intra Meds edited

## 2016-04-09 NOTE — Op Note (Signed)
04/09/2016  9:17 AM  PATIENT:   Courtney Avila  76 y.o. female  PRE-OPERATIVE DIAGNOSIS:  Right Shoulder Rotator Tear Arthropathy  POST-OPERATIVE DIAGNOSIS:  same  PROCEDURE:  R RSA #7 stem, +6 spacer, +3 poly, 36/+4 glenosphere  SURGEON:  Shanicqua Coldren, Metta Clines M.D.  ASSISTANTS: Shuford pac   ANESTHESIA:   GET + ISB  EBL: 200  SPECIMEN:  none  Drains: none   PATIENT DISPOSITION:  PACU - hemodynamically stable.    PLAN OF CARE: Admit for overnight observation  Dictation# [id number]   Contact # [phone]

## 2016-04-10 ENCOUNTER — Encounter (HOSPITAL_COMMUNITY): Payer: Self-pay | Admitting: Orthopedic Surgery

## 2016-04-10 MED ORDER — HYDROCODONE-ACETAMINOPHEN 5-325 MG PO TABS: 1.0000 | ORAL_TABLET | ORAL | 0 refills | Status: DC | PRN

## 2016-04-10 MED ORDER — ONDANSETRON HCL 4 MG PO TABS: 4.0000 mg | ORAL_TABLET | Freq: Three times a day (TID) | ORAL | 0 refills | Status: DC | PRN

## 2016-04-10 NOTE — Evaluation (Signed)
Physical Therapy Evaluation Patient Details Name: Courtney Avila MRN: [id number] DOB: 1940/11/24 Today's Date: 04/10/2016   History of Present Illness  Pt is a 75 y/o female presenting post op reverse total right shoulder arthroplasty. Pt has a past medical history of Anxiety; Arthritis; and GERD (gastroesophageal reflux disease).  Clinical Impression  Pt seen for initial evaluation and treatment with focus on ambulation. Pt able to ambulate 150 ft without an assistive device. Pt did have mild instability but no gross loss of balance. Recommended to pt that she have assistance with ambulation when she returns home (pt in agreement). Patient denies any questions or concerns. Will continue to follow acutely in anticipation of D/C to home.     Follow Up Recommendations No PT follow up;Supervision for mobility/OOB     Equipment Recommendations  None recommended by PT    Recommendations for Other Services       Precautions / Restrictions Precautions Precautions: Fall;Shoulder Type of Shoulder Precautions: Active Protocol Shoulder Interventions: Shoulder sling/immobilizer;Off for dressing/bathing/exercises (if sitting in controlled environment ok to come out of sling) Precaution Booklet Issued: Yes (comment) Precaution Comments: OT shoulder D/C handout Required Braces or Orthoses: Sling Restrictions Weight Bearing Restrictions: Yes RUE Weight Bearing: Non weight bearing      Mobility  Bed Mobility      General bed mobility comments: up in chair upon arrival  Transfers Overall transfer level: Needs assistance Equipment used: None Transfers: Sit to/from Stand Sit to Stand: Min guard         General transfer comment: pt denies any dizziness upon standing.   Ambulation/Gait Ambulation/Gait assistance: Min guard Ambulation Distance (Feet): 150 Feet Assistive device: None Gait Pattern/deviations: Step-through pattern;Decreased step length - right;Decreased step length -  left Gait velocity: decreased   General Gait Details: mild instability but no loss of balance. Encouraged pt to have someone with her with activity, pt in agreement.   Stairs            Wheelchair Mobility    Modified Rankin (Stroke Patients Only)       Balance Overall balance assessment: Needs assistance Sitting-balance support: No upper extremity supported Sitting balance-Leahy Scale: Good     Standing balance support: No upper extremity supported Standing balance-Leahy Scale: Fair Standing balance comment: mild instability with ambulation, able to stand static.                              Pertinent Vitals/Pain Pain Assessment: No/denies pain Pain Score: 0-No pain Pain Intervention(s): Monitored during session    [place] expects to be discharged to:: Private residence Living Arrangements: Children (son and grandson) Available Help at Discharge: Family Type of Home: House Home Access: Level entry     Home Layout: Two level (able to live in basement with own level entrance) Home Equipment: [place]andra Knudsen - single point Additional Comments: Pt reports that she will have family available at all times.     Prior Function Level of Independence: Independent               Hand Dominance       Extremity/Trunk Assessment   Upper Extremity Assessment: Defer to OT evaluation        Lower Extremity Assessment: Overall WFL for tasks assessed      Cervical / Trunk Assessment: Normal  Communication   Communication: No difficulties  Cognition Arousal/Alertness: Awake/alert Behavior During Therapy: WFL for tasks assessed/performed Overall Cognitive Status:  Within Functional Limits for tasks assessed                      General Comments General comments (skin integrity, edema, etc.): Pt reports feeling dizzy earlier with OT. BP; supine 115/88, sitting 135/96, standing 113/90 - pt denies any increased dizziness during  session.     Exercises     Assessment/Plan    PT Assessment Patient needs continued PT services  PT Problem List Decreased activity tolerance;Decreased balance;Decreased mobility          PT Treatment Interventions DME instruction;Gait training;Functional mobility training;Therapeutic activities;Stair training;Therapeutic exercise;Patient/family education    PT Goals (Current goals can be found in the Care Plan section)  Acute Rehab PT Goals Patient Stated Goal: To get home PT Goal Formulation: With patient Time For Goal Achievement: 04/17/16 Potential to Achieve Goals: Good    Frequency Min 3X/week   Barriers to discharge        Co-evaluation               End of Session Equipment Utilized During Treatment: Gait belt Activity Tolerance: Patient tolerated treatment well Patient left: in chair;with call bell/phone within reach (Lt UE supported) Nurse Communication: Mobility status    Functional Assessment Tool Used: clinical judgment Functional Limitation: Mobility: Walking and moving around Mobility: Walking and Moving Around Current Status [phone]): At least 1 percent but less than 20 percent impaired, limited or restricted Mobility: Walking and Moving Around Goal Status [phone]): At least 1 percent but less than 20 percent impaired, limited or restricted    Time: 1153-1216 PT Time Calculation (min) (ACUTE ONLY): 23 min   Charges:   PT Evaluation $PT Eval Moderate Complexity: 1 Procedure PT Treatments $Gait Training: 8-22 mins   PT G Codes:   PT G-Codes **NOT FOR INPATIENT CLASS** Functional Assessment Tool Used: clinical judgment Functional Limitation: Mobility: Walking and moving around Mobility: Walking and Moving Around Current Status [id number]): At least 1 percent but less than 20 percent impaired, limited or restricted Mobility: Walking and Moving Around Goal Status [phone]): At least 1 percent but less than 20 percent impaired, limited or restricted     Cassell Clement, PT, CSCS Pager [phone] Office [phone]  04/10/2016, 12:48 PM

## 2016-04-10 NOTE — Progress Notes (Signed)
Assumed care for patient at 11:30pm. Took patient pain medication in and introduce myself. Patient was very anxious. Patient settled down and went to sleep. I went back later and started patient IV antibiotics. Antibiotics stopped, went in the room and [place] and Traci Sermon was in there assisting patient. Patient was crying, I asked patient was she in pain and she stated no she needed to use the restroom.  Was helping patient get up and patient IV pulled out. Patient got hysterical and started crying and yelling. Charge nurse was called in to assist. Patient care was assumed by another nurse. Marland Kitchen

## 2016-04-10 NOTE — Evaluation (Signed)
Occupational Therapy Evaluation Patient Details Name: Courtney Avila MRN: [id number] DOB: [birth year] Today's Date: 04/10/2016    History of Present Illness Pt is a 75 y/o female presenting post op reverse total right shoulder arthroplasty. Pt has a past medical history of Anxiety; Arthritis; and GERD (gastroesophageal reflux disease).   Clinical Impression   PTA Pt independent in ADL and mobility. Pt currently requires mod assist for ADL and is min guard for mobility (Pt had 2 times where she needed to sit during exercises because she felt like she was going to pass out - suspect medications). Pt provided shoulder handout and discussed in detail, practiced exercises. Pt needed max verbal cues during functional activites for NWB status of RUE. Pt will benefit from continued OT in the acute care setting to maximize safety and independence during ADL and maintaining NWB of RUE.    Follow Up Recommendations  Supervision/Assistance - 24 hour    Equipment Recommendations  None recommended by OT    Recommendations for Other Services PT consult     Precautions / Restrictions Precautions Precautions: Fall;Shoulder Type of Shoulder Precautions: Active Protocol Shoulder Interventions: Shoulder sling/immobilizer;Off for dressing/bathing/exercises (if sitting in controlled environment ok to come out of sling) Precaution Booklet Issued: Yes (comment) Precaution Comments: OT shoulder D/C handout Required Braces or Orthoses: Sling Restrictions Weight Bearing Restrictions: Yes RUE Weight Bearing: Non weight bearing      Mobility Bed Mobility Overal bed mobility: Needs Assistance Bed Mobility: Supine to Sit     Supine to sit: Min guard     General bed mobility comments: vc to not use RUE to push, use of bed rail to come EOB  Transfers Overall transfer level: Needs assistance Equipment used: None Transfers: Sit to/from Stand Sit to Stand: Min guard         General transfer  comment: vc to maintain NWB status    Balance Overall balance assessment: Needs assistance Sitting-balance support: No upper extremity supported;Feet supported Sitting balance-Leahy Scale: Good     Standing balance support: During functional activity;No upper extremity supported Standing balance-Leahy Scale: Fair Standing balance comment: Pt stated feeling like "I'm going to pass out" after standimg for approx 2.5 min                            ADL Overall ADL's : Needs assistance/impaired     Grooming: Wash/dry hands;Wash/dry face;Cueing for safety;Cueing for UE precautions;Standing;Supervision/safety Grooming Details (indicate cue type and reason): multiple cues despite arm being in sling to use LUE Upper Body Bathing: Moderate assistance;Standing Upper Body Bathing Details (indicate cue type and reason): Pt educated in safe cleaning under operated arm Lower Body Bathing: Min guard;Sit to/from stand Lower Body Bathing Details (indicate cue type and reason): Pt able to reach down to feet Upper Body Dressing : Moderate assistance;Cueing for UE precautions Upper Body Dressing Details (indicate cue type and reason): Pt needing multiple verbal cues to not use RUE during donning of hospital gown     Toilet Transfer: Min guard;Comfort height toilet Toilet Transfer Details (indicate cue type and reason): for safety Toileting- Clothing Manipulation and Hygiene: Min guard Toileting - Clothing Manipulation Details (indicate cue type and reason): Pt able to perform peri care after urination     Functional mobility during ADLs: Supervision/safety (Pt stated "I feel like I'm going to pass out" x2) General ADL Comments: reviewed shoulder handout in detail and demonstrated/practiced all sections. After education Pt still needing  multiple cues for NWB status of RUE     Vision     Perception     Praxis      Pertinent Vitals/Pain Pain Assessment: No/denies pain Pain Score:  0-No pain Pain Location: R shoulder (with exercises) Pain Descriptors / Indicators: Sore;Guarding;Grimacing Pain Intervention(s): Limited activity within patient's tolerance;Monitored during session;Repositioned     Hand Dominance Right   Extremity/Trunk Assessment Upper Extremity Assessment Upper Extremity Assessment: RUE deficits/detail RUE Deficits / Details: post-op reverse shoulder replacement RUE: Unable to fully assess due to immobilization RUE Coordination: decreased gross motor   Lower Extremity Assessment Lower Extremity Assessment: Overall WFL for tasks assessed   Cervical / Trunk Assessment Cervical / Trunk Assessment: Normal   Communication Communication Communication: No difficulties   Cognition Arousal/Alertness: Awake/alert Behavior During Therapy: WFL for tasks assessed/performed Overall Cognitive Status: Within Functional Limits for tasks assessed                     General Comments       Exercises       Shoulder Instructions      Home Living Family/patient expects to be discharged to:: Private residence Living Arrangements: Children (son and grandson) Available Help at Discharge: Family Type of Home: House Home Access: Level entry     Home Layout: Multi-level;Able to live on main level with bedroom/bathroom (in basement but can walk around the outside to enter)     Bathroom Shower/Tub: Tub/shower unit Shower/tub characteristics: Curtain Biochemist, clinical: Handicapped height Bathroom Accessibility: Yes How Accessible: Accessible via walker Home Equipment: [place] - single point;Grab bars - tub/shower;Hand held shower head   Additional Comments: Pt reports being in the basement and having concrete floors      Prior Functioning/Environment Level of Independence: Independent                 OT Problem List: Decreased range of motion;Decreased activity tolerance;Impaired balance (sitting and/or standing);Decreased knowledge of use  of DME or AE;Decreased knowledge of precautions;Impaired UE functional use   OT Treatment/Interventions:      OT Goals(Current goals can be found in the care plan section) Acute Rehab OT Goals Patient Stated Goal: To get home OT Goal Formulation: With patient Time For Goal Achievement: 04/17/16 Potential to Achieve Goals: Good ADL Goals Pt Will Perform Grooming: with supervision;standing Pt Will Perform Upper Body Dressing: with mod assist;standing;with caregiver independent in assisting Pt Will Perform Lower Body Dressing: with min guard assist;with caregiver independent in assisting;sit to/from stand Additional ADL Goal #1: Pt will be able to maintain NWB status during sink level ADL with no verbal cues  OT Frequency:     Barriers to D/C:            Co-evaluation              End of Session Equipment Utilized During Treatment: Other (comment) (sling) Nurse Communication: Precautions;Weight bearing status  Activity Tolerance: Patient tolerated treatment well Patient left: in chair;with call bell/phone within reach   Time: [id number] OT Time Calculation (min): 52 min Charges:  OT General Charges $OT Visit: 1 Procedure OT Evaluation $OT Eval Low Complexity: 1 Procedure OT Treatments $Self Care/Home Management : 8-22 mins $Therapeutic Exercise: 8-22 mins G-Codes:    Merri Ray Dailon Sheeran 04-28-2016, 11:46 AM  Hulda Humphrey OTR/L [phone]

## 2016-04-10 NOTE — Progress Notes (Signed)
Occupational Therapy Treatment Patient Details Name: Courtney Avila MRN: [id number] DOB: 1941/01/25 Today's Date: 04/10/2016    History of present illness Pt is a 75 y/o female presenting post op reverse total right shoulder arthroplasty. Pt has a past medical history of Anxiety; Arthritis; and GERD (gastroesophageal reflux disease).   OT comments  Pt making progress towards OT goals. This session focused on educating family, and reinforcing information in shoulder handout. All sections were reviewed, exercises were demonstrated for family, and they had no further questions or concerns about OT or other self-care activities. Family wanting to see head nurse about nurses from night before.   Follow Up Recommendations  Supervision/Assistance - 24 hour - Rehab progress of shoulder as determined by MD at follow up appointment    Equipment Recommendations  None recommended by OT    Recommendations for Other Services      Precautions / Restrictions Precautions Precautions: Fall;Shoulder Type of Shoulder Precautions: Active Protocol Shoulder Interventions: Shoulder sling/immobilizer;Off for dressing/bathing/exercises (if sitting in controlled environment ok to come out of slin) Precaution Booklet Issued: Yes (comment) Precaution Comments: OT shoulder D/C handout Required Braces or Orthoses: Sling Restrictions Weight Bearing Restrictions: Yes RUE Weight Bearing: Non weight bearing       Mobility Bed Mobility               General bed mobility comments: up in chair upon arrival  Transfers Overall transfer level: Needs assistance Equipment used: None Transfers: Sit to/from Stand Sit to Stand: Min guard         General transfer comment: pt denies any dizziness upon standing.     Balance Overall balance assessment: Needs assistance Sitting-balance support: No upper extremity supported Sitting balance-Leahy Scale: Good     Standing balance support: No upper extremity  supported Standing balance-Leahy Scale: Fair Standing balance comment: mild instability with ambulation, able to stand static.                    ADL                                         General ADL Comments: Reviewed shoulder handout in detail with Pt, son, and grandson (who will providing caregiving)      Vision                     Perception     Praxis      Cognition   Behavior During Therapy: WFL for tasks assessed/performed Overall Cognitive Status: Within Functional Limits for tasks assessed                       Extremity/Trunk Assessment  Upper Extremity Assessment Upper Extremity Assessment: Defer to OT evaluation   Lower Extremity Assessment Lower Extremity Assessment: Overall WFL for tasks assessed        Exercises Shoulder Exercises Shoulder Flexion: PROM;AROM;Right;10 reps;Standing (0-90) Shoulder ABduction: PROM;AROM;Right;10 reps;Standing (0-60) Shoulder External Rotation:  (no resisted internal rotation and no exercises for IR) Elbow Flexion: AROM;Right;10 reps;Standing Elbow Extension: AROM;10 reps;Right;Standing Wrist Flexion: AROM;Right Wrist Extension: AROM;Right Digit Composite Flexion: AROM;Right Composite Extension: AROM;Right Neck Flexion: AROM Neck Extension: AROM Neck Lateral Flexion - Right: AROM Neck Lateral Flexion - Left: AROM Donning/doffing shirt without moving shoulder: Moderate assistance Method for sponge bathing under operated UE: Supervision/safety Donning/doffing sling/immobilizer: Maximal assistance Correct positioning of sling/immobilizer:  Maximal assistance ROM for elbow, wrist and digits of operated UE: Supervision/safety Sling wearing schedule (on at all times/off for ADL's): Moderate assistance Proper positioning of operated UE when showering: Minimal assistance Positioning of UE while sleeping: Minimal assistance   Shoulder Instructions Shoulder  Instructions Donning/doffing shirt without moving shoulder: Moderate assistance Method for sponge bathing under operated UE: Supervision/safety Donning/doffing sling/immobilizer: Maximal assistance Correct positioning of sling/immobilizer: Maximal assistance ROM for elbow, wrist and digits of operated UE: Supervision/safety Sling wearing schedule (on at all times/off for ADL's): Moderate assistance Proper positioning of operated UE when showering: Minimal assistance Positioning of UE while sleeping: Minimal assistance     General Comments      Pertinent Vitals/ Pain       Pain Assessment: No/denies pain Pain Intervention(s): Monitored during session  Home Living Family/patient expects to be discharged to:: Private residence Living Arrangements: Children (son and grandson) Available Help at Discharge: Family Type of Home: House Home Access: Level entry     Home Layout: Two level (able to live in basement with own level entrance) Alternate Level Stairs-Number of Steps: flight             Home Equipment: Kasandra Knudsen - single point   Additional Comments: Pt reports that she will have family available at all times.       Prior Functioning/Environment Level of Independence: Independent            Frequency  Min 2X/week        Progress Toward Goals  OT Goals(current goals can now be found in the care plan section)  Progress towards OT goals: Progressing toward goals  Acute Rehab OT Goals Patient Stated Goal: To get home OT Goal Formulation: With patient Time For Goal Achievement: 04/17/16 Potential to Achieve Goals: Good  Plan Discharge plan remains appropriate    Co-evaluation                 End of Session Equipment Utilized During Treatment: Other (comment) (sling)   Activity Tolerance Patient tolerated treatment well   Patient Left in chair;with call bell/phone within reach;with family/visitor present   Nurse Communication Precautions;Weight  bearing status        Time: 1445-1501 OT Time Calculation (min): 16 min  Charges: OT General Charges $OT Visit: 1 Procedure OT Treatments $Self Care/Home Management : 8-22 mins  Merri Ray Mercedes Fort 04/10/2016, 4:20 PM Hulda Humphrey OTR/L [phone]

## 2016-04-10 NOTE — Progress Notes (Signed)
Patient was very emotional regarding care she received last night with nursing. She is stable for discharge currently however I have spoken with patient and nursing supervisor at length in order to look into issues reported last night.

## 2016-04-10 NOTE — Discharge Summary (Signed)
PATIENT ID:      Courtney Avila  MRN:     [id number] DOB/AGE:    1941/04/01 / 75 y.o.     DISCHARGE SUMMARY  ADMISSION DATE:    04/09/2016 DISCHARGE DATE:    ADMISSION DIAGNOSIS: Right Shoulder Rotator Tear Arthropathy Past Medical History:  Diagnosis Date  . Allergy   . Anxiety   . Arthritis   . GERD (gastroesophageal reflux disease)     DISCHARGE DIAGNOSIS:   Active Problems:   S/P reverse total shoulder arthroplasty, right   PROCEDURE: Procedure(s): RIGHT REVERSE SHOULDER ARTHROPLASTY on 04/09/2016  CONSULTS:    HISTORY:  See H&P in chart.  HOSPITAL COURSE:  Courtney Avila is a 75 y.o. admitted on 04/09/2016 with a diagnosis of Right Shoulder Rotator Tear Arthropathy.  They were brought to the operating room on 04/09/2016 and underwent Procedure(s): RIGHT REVERSE SHOULDER ARTHROPLASTY.    They were given perioperative antibiotics: Anti-infectives    Start     Dose/Rate Route Frequency Ordered Stop   04/09/16 1330  ceFAZolin (ANCEF) IVPB 2g/100 mL premix     2 g 200 mL/hr over 30 Minutes Intravenous Every 6 hours 04/09/16 1215 04/10/16 0056   04/09/16 0608  ceFAZolin (ANCEF) IVPB 2g/100 mL premix     2 g 200 mL/hr over 30 Minutes Intravenous On call to O.R. 04/09/16 0608 04/09/16 0726   04/09/16 0533  ceFAZolin (ANCEF) 2-4 GM/100ML-% IVPB    Comments:  Debbe Bales, Meredit: cabinet override      04/09/16 0533 04/09/16 0726    .  Patient underwent the above named procedure and tolerated it well. The following day they were hemodynamically stable and pain was controlled on oral analgesics. She had an unfortunate night with nursing clientele experiences however from a surgical standpoint did quite well.They were neurovascularly intact to the operative extremity. OT was ordered and worked with patient per protocol. They were medically and orthopaedically stable for discharge on day 1. Home health was arranged    DIAGNOSTIC STUDIES:  RECENT RADIOGRAPHIC STUDIES :  No results  found.  RECENT VITAL SIGNS:  Patient Vitals for the past 24 hrs:  BP Temp Temp src Pulse Resp SpO2  04/10/16 0500 (!) 155/73 98.3 F (36.8 C) Oral 67 17 96 %  04/10/16 0100 (!) 166/84 98.6 F (37 C) Oral 85 17 96 %  04/09/16 2000 (!) 152/115 98.2 F (36.8 C) Oral 84 16 94 %  04/09/16 1259 99/83 98.6 F (37 C) Oral (!) 59 16 91 %  04/09/16 1155 103/67 98 F (36.7 C) - 65 18 94 %  04/09/16 1145 - - - (!) 59 14 94 %  04/09/16 1130 - - - 61 16 94 %  04/09/16 1115 (!) 117/58 - - (!) 57 18 94 %  04/09/16 1100 (!) 110/98 98.1 F (36.7 C) - (!) 55 16 98 %  04/09/16 1056 - - - 72 12 90 %  04/09/16 1045 - - - 63 20 98 %  04/09/16 1033 114/82 - - - - -  04/09/16 1030 - - - 60 (!) 23 94 %  04/09/16 1017 100/79 - - - - -  04/09/16 1015 - - - 62 18 94 %  04/09/16 1005 (!) 102/54 - - - - -  04/09/16 1000 - - - 64 (!) 23 95 %  04/09/16 0949 (!) 135/117 - - - - -  04/09/16 0945 - - - 67 (!) 21 95 %  04/09/16 0941 (!) 98/48 - - - - -  04/09/16 0930 (!) 98/48 - - 84 15 96 %  04/09/16 0929 - 97.9 F (36.6 C) - 86 - 94 %  .  RECENT EKG RESULTS:    Orders placed or performed during the hospital encounter of 04/07/16  . EKG 12 lead  . EKG 12 lead    DISCHARGE INSTRUCTIONS:    DISCHARGE MEDICATIONS:     Medication List    TAKE these medications   busPIRone 10 MG tablet Commonly known as:  BUSPAR Take 10 mg by mouth 3 (three) times daily as needed (for anxiety).   Fish Oil 1000 MG Caps Take 1,000 mg by mouth daily.   HAIR/SKIN/NAILS/BIOTIN Tabs Take 1 tablet by mouth daily.   HYDROcodone-acetaminophen 5-325 MG tablet Commonly known as:  NORCO Take 1-2 tablets by mouth every 4 (four) hours as needed.   multivitamin with minerals Tabs tablet Take 1 tablet by mouth daily. Centrum Silver.   ondansetron 4 MG tablet Commonly known as:  ZOFRAN Take 1 tablet (4 mg total) by mouth every 8 (eight) hours as needed for nausea or vomiting.   PARoxetine 20 MG tablet Commonly known  as:  PAXIL Take 20 mg by mouth every evening.   temazepam 15 MG capsule Commonly known as:  RESTORIL Take 30 mg by mouth at bedtime.       FOLLOW UP VISIT:   Follow-up Information    Metta Clines SUPPLE, MD.   Specialty:  Orthopedic Surgery Why:  call to be seen in 10-14 days Contact information: [address] [place] [address] [id number]           DISCHARGE TO: Home  DISPOSITION: Good  DISCHARGE CONDITION:  Festus Barren for Dr. Justice Britain 04/10/2016, 8:26 AM

## 2016-04-10 NOTE — Progress Notes (Signed)
Assumed care of patient at this time. Pt seems to be settling in bed. Pt requests sleeping aid or something for anxiety. Diazepam available and pt req to receive. Med administered. Pt has safety precautions in place. Call bell in reach.

## 2016-04-10 NOTE — Progress Notes (Signed)
Patients sent home without discharge paperwork or prescriptions, patients grandson came back in and discharge instructions were discussed with grandson by Radio broadcast assistant

## 2016-04-15 DIAGNOSIS — Z96611 Presence of right artificial shoulder joint: Secondary | ICD-10-CM | POA: Diagnosis not present

## 2016-04-15 DIAGNOSIS — M25511 Pain in right shoulder: Secondary | ICD-10-CM | POA: Diagnosis not present

## 2016-04-15 DIAGNOSIS — R2689 Other abnormalities of gait and mobility: Secondary | ICD-10-CM | POA: Diagnosis not present

## 2016-04-15 DIAGNOSIS — K210 Gastro-esophageal reflux disease with esophagitis: Secondary | ICD-10-CM | POA: Diagnosis not present

## 2016-04-15 DIAGNOSIS — Z471 Aftercare following joint replacement surgery: Secondary | ICD-10-CM | POA: Diagnosis not present

## 2016-04-15 DIAGNOSIS — F419 Anxiety disorder, unspecified: Secondary | ICD-10-CM | POA: Diagnosis not present

## 2016-04-15 DIAGNOSIS — M170 Bilateral primary osteoarthritis of knee: Secondary | ICD-10-CM | POA: Diagnosis not present

## 2016-04-15 DIAGNOSIS — Z4801 Encounter for change or removal of surgical wound dressing: Secondary | ICD-10-CM | POA: Diagnosis not present

## 2016-04-21 DIAGNOSIS — Z96611 Presence of right artificial shoulder joint: Secondary | ICD-10-CM | POA: Diagnosis not present

## 2016-04-21 DIAGNOSIS — F419 Anxiety disorder, unspecified: Secondary | ICD-10-CM | POA: Diagnosis not present

## 2016-04-21 DIAGNOSIS — M170 Bilateral primary osteoarthritis of knee: Secondary | ICD-10-CM | POA: Diagnosis not present

## 2016-04-21 DIAGNOSIS — M25511 Pain in right shoulder: Secondary | ICD-10-CM | POA: Diagnosis not present

## 2016-04-21 DIAGNOSIS — Z471 Aftercare following joint replacement surgery: Secondary | ICD-10-CM | POA: Diagnosis not present

## 2016-04-21 DIAGNOSIS — R2689 Other abnormalities of gait and mobility: Secondary | ICD-10-CM | POA: Diagnosis not present

## 2016-04-22 DIAGNOSIS — Z471 Aftercare following joint replacement surgery: Secondary | ICD-10-CM | POA: Diagnosis not present

## 2016-04-22 DIAGNOSIS — Z96611 Presence of right artificial shoulder joint: Secondary | ICD-10-CM | POA: Diagnosis not present

## 2016-04-23 DIAGNOSIS — Z96611 Presence of right artificial shoulder joint: Secondary | ICD-10-CM | POA: Diagnosis not present

## 2016-04-23 DIAGNOSIS — Z471 Aftercare following joint replacement surgery: Secondary | ICD-10-CM | POA: Diagnosis not present

## 2016-04-23 DIAGNOSIS — M170 Bilateral primary osteoarthritis of knee: Secondary | ICD-10-CM | POA: Diagnosis not present

## 2016-04-23 DIAGNOSIS — M25511 Pain in right shoulder: Secondary | ICD-10-CM | POA: Diagnosis not present

## 2016-04-23 DIAGNOSIS — F419 Anxiety disorder, unspecified: Secondary | ICD-10-CM | POA: Diagnosis not present

## 2016-04-23 DIAGNOSIS — R2689 Other abnormalities of gait and mobility: Secondary | ICD-10-CM | POA: Diagnosis not present

## 2016-04-28 DIAGNOSIS — M170 Bilateral primary osteoarthritis of knee: Secondary | ICD-10-CM | POA: Diagnosis not present

## 2016-04-28 DIAGNOSIS — R2689 Other abnormalities of gait and mobility: Secondary | ICD-10-CM | POA: Diagnosis not present

## 2016-04-28 DIAGNOSIS — F419 Anxiety disorder, unspecified: Secondary | ICD-10-CM | POA: Diagnosis not present

## 2016-04-28 DIAGNOSIS — M25511 Pain in right shoulder: Secondary | ICD-10-CM | POA: Diagnosis not present

## 2016-04-28 DIAGNOSIS — Z96611 Presence of right artificial shoulder joint: Secondary | ICD-10-CM | POA: Diagnosis not present

## 2016-04-28 DIAGNOSIS — Z471 Aftercare following joint replacement surgery: Secondary | ICD-10-CM | POA: Diagnosis not present

## 2016-05-01 DIAGNOSIS — F419 Anxiety disorder, unspecified: Secondary | ICD-10-CM | POA: Diagnosis not present

## 2016-05-01 DIAGNOSIS — M170 Bilateral primary osteoarthritis of knee: Secondary | ICD-10-CM | POA: Diagnosis not present

## 2016-05-01 DIAGNOSIS — R2689 Other abnormalities of gait and mobility: Secondary | ICD-10-CM | POA: Diagnosis not present

## 2016-05-01 DIAGNOSIS — Z96611 Presence of right artificial shoulder joint: Secondary | ICD-10-CM | POA: Diagnosis not present

## 2016-05-01 DIAGNOSIS — Z471 Aftercare following joint replacement surgery: Secondary | ICD-10-CM | POA: Diagnosis not present

## 2016-05-01 DIAGNOSIS — M25511 Pain in right shoulder: Secondary | ICD-10-CM | POA: Diagnosis not present

## 2016-05-06 DIAGNOSIS — M170 Bilateral primary osteoarthritis of knee: Secondary | ICD-10-CM | POA: Diagnosis not present

## 2016-05-06 DIAGNOSIS — R2689 Other abnormalities of gait and mobility: Secondary | ICD-10-CM | POA: Diagnosis not present

## 2016-05-06 DIAGNOSIS — Z471 Aftercare following joint replacement surgery: Secondary | ICD-10-CM | POA: Diagnosis not present

## 2016-05-06 DIAGNOSIS — Z96611 Presence of right artificial shoulder joint: Secondary | ICD-10-CM | POA: Diagnosis not present

## 2016-05-06 DIAGNOSIS — M25511 Pain in right shoulder: Secondary | ICD-10-CM | POA: Diagnosis not present

## 2016-05-06 DIAGNOSIS — F419 Anxiety disorder, unspecified: Secondary | ICD-10-CM | POA: Diagnosis not present

## 2016-05-20 DIAGNOSIS — Z471 Aftercare following joint replacement surgery: Secondary | ICD-10-CM | POA: Diagnosis not present

## 2016-05-20 DIAGNOSIS — Z96611 Presence of right artificial shoulder joint: Secondary | ICD-10-CM | POA: Diagnosis not present

## 2016-06-03 DIAGNOSIS — M25611 Stiffness of right shoulder, not elsewhere classified: Secondary | ICD-10-CM | POA: Diagnosis not present

## 2016-08-31 DIAGNOSIS — F3342 Major depressive disorder, recurrent, in full remission: Secondary | ICD-10-CM | POA: Diagnosis not present

## 2016-09-03 DIAGNOSIS — F3342 Major depressive disorder, recurrent, in full remission: Secondary | ICD-10-CM | POA: Diagnosis not present

## 2016-09-03 DIAGNOSIS — F4312 Post-traumatic stress disorder, chronic: Secondary | ICD-10-CM | POA: Diagnosis not present

## 2016-09-25 DIAGNOSIS — M1711 Unilateral primary osteoarthritis, right knee: Secondary | ICD-10-CM | POA: Diagnosis not present

## 2016-11-02 ENCOUNTER — Ambulatory Visit (INDEPENDENT_AMBULATORY_CARE_PROVIDER_SITE_OTHER): Payer: Medicare Other | Admitting: Emergency Medicine

## 2016-11-02 ENCOUNTER — Ambulatory Visit (INDEPENDENT_AMBULATORY_CARE_PROVIDER_SITE_OTHER): Payer: Medicare Other

## 2016-11-02 ENCOUNTER — Encounter: Payer: Self-pay | Admitting: Emergency Medicine

## 2016-11-02 VITALS — BP 110/60 | HR 75 | Temp 97.4°F | Resp 18 | Ht 59.0 in | Wt 163.8 lb

## 2016-11-02 DIAGNOSIS — M1711 Unilateral primary osteoarthritis, right knee: Secondary | ICD-10-CM | POA: Diagnosis not present

## 2016-11-02 DIAGNOSIS — Z01818 Encounter for other preprocedural examination: Secondary | ICD-10-CM | POA: Diagnosis not present

## 2016-11-02 DIAGNOSIS — Z471 Aftercare following joint replacement surgery: Secondary | ICD-10-CM | POA: Diagnosis not present

## 2016-11-02 DIAGNOSIS — Z96651 Presence of right artificial knee joint: Secondary | ICD-10-CM | POA: Diagnosis not present

## 2016-11-02 NOTE — Progress Notes (Signed)
Subjective:    Courtney Avila is a 76 y.o. female who presents to the office today for a preoperative consultation at the request of surgeon (Orthopedist) who plans on performing right knee surgery on June TBD. This consultation is requested for the specific conditions prompting preoperative evaluation (i.e. because of potential affect on operative risk): Marland Kitchen Planned anesthesia: general. The patient has the following known anesthesia issues: none. Patients bleeding risk: no recent abnormal bleeding. Patient does not have objections to receiving blood products if needed.  The following portions of the patient's history were reviewed and updated as appropriate: allergies, current medications, past family history, past medical history, past social history, past surgical history and problem list.  Review of Systems A comprehensive review of systems was negative.    Objective:    BP 110/60 (BP Location: Right Arm, Patient Position: Sitting, Cuff Size: Large)   Pulse 75   Temp 97.4 F (36.3 C) (Oral)   Resp 18   Ht 4\' 11"  (1.499 m)   Wt 163 lb 12.8 oz (74.3 kg)   SpO2 93%   BMI 33.08 kg/m   General Appearance:    Alert, cooperative, no distress, appears stated age  Head:    Normocephalic, without obvious abnormality, atraumatic  Eyes:    PERRL, conjunctiva/corneas clear, EOM's intact  Ears:    Normal TM's and external ear canals, both ears  Nose:   Nares normal, septum midline, mucosa normal, no drainage    or sinus tenderness  Throat:   Lips, mucosa, and tongue normal; teeth and gums normal  Neck:   Supple, symmetrical, trachea midline, no adenopathy;    thyroid:  no enlargement/tenderness/nodules; no carotid   bruit or JVD  Back:     Symmetric, no curvature, ROM normal, no CVA tenderness  Lungs:     Clear to auscultation bilaterally, respirations unlabored  Chest Wall:    No tenderness or deformity   Heart:    Regular rate and rhythm, S1 and S2 normal, no murmur, rub   or gallop      Abdomen:     Soft, non-tender, bowel sounds active all four quadrants,    no masses, no organomegaly        Extremities:   Extremities normal, atraumatic, no cyanosis or edema  Pulses:   2+ and symmetric all extremities  Skin:   Skin color, texture, turgor normal, no rashes or lesions  Lymph nodes:   Cervical, supraclavicular, and axillary nodes normal  Neurologic:   CNII-XII intact, normal strength, sensation and reflexes    throughout    Predictors of intubation difficulty:  Morbid obesity? no  Anatomically abnormal facies? no  Prominent incisors? no  Receding mandible? no  Short, thick neck? no  Neck range of motion: normal     Cardiographics ECG: normal sinus rhythm, no blocks or conduction defects, no ischemic changes Echocardiogram: not done  Imaging Chest x-ray: normal   Lab Review  Glucose 148; o/w unremarkable and WNL.     Assessment:      76 y.o. female with planned surgery as above.   Known risk factors for perioperative complications: None   Difficulty with intubation is not anticipated.  Cardiac Risk Estimation: low  Current medications which may produce withdrawal symptoms if withheld perioperatively: none     Plan:    1. Preoperative workup as follows chest x-ray, ECG, hemoglobin, hematocrit, electrolytes, creatinine, glucose, liver function studies. 2. Change in medication regimen before surgery: none, continue medication regimen including morning  of surgery, with sip of water. 3. Prophylaxis for cardiac events with perioperative beta-blockers: not indicated. 4. Invasive hemodynamic monitoring perioperatively: at the discretion of anesthesiologist. 5. Deep vein thrombosis prophylaxis postoperatively:regimen to be chosen by surgical team. 6. Surveillance for postoperative MI with ECG immediately postoperatively and on postoperative days 1 and 2 AND troponin levels 24 hours postoperatively and on day 4 or hospital discharge (whichever comes first): at  the discretion of anesthesiologist.

## 2016-11-02 NOTE — Progress Notes (Signed)
p 

## 2016-11-02 NOTE — Patient Instructions (Addendum)
     IF you received an x-ray today, you will receive an invoice from [facility]. Please contact [facility] at [phone] with questions or concerns regarding your invoice.   IF you received labwork today, you will receive an invoice from [facility]. Please contact [facility] at [phone] with questions or concerns regarding your invoice.   Our billing staff will not be able to assist you with questions regarding bills from these companies.  You will be contacted with the lab results as soon as they are available. The fastest way to get your results is to activate your My Chart account. Instructions are located on the last page of this paperwork. If you have not heard from us regarding the results in 2 weeks, please contact this office.      Place preoperative clearance patient instructions here.  

## 2016-11-03 LAB — COMPREHENSIVE METABOLIC PANEL
ALK PHOS: 70 IU/L (ref 39–117)
ALT: 10 IU/L (ref 0–32)
AST: 20 IU/L (ref 0–40)
Albumin/Globulin Ratio: 1.5 (ref 1.2–2.2)
Albumin: 4 g/dL (ref 3.5–4.8)
BUN/Creatinine Ratio: 30 — ABNORMAL HIGH (ref 12–28)
BUN: 18 mg/dL (ref 8–27)
CHLORIDE: 103 mmol/L (ref 96–106)
CO2: 27 mmol/L (ref 18–29)
CREATININE: 0.61 mg/dL (ref 0.57–1.00)
Calcium: 9.2 mg/dL (ref 8.7–10.3)
GFR calc Af Amer: 101 mL/min/{1.73_m2} (ref >59)
GFR calc non Af Amer: 88 mL/min/{1.73_m2} (ref >59)
GLOBULIN, TOTAL: 2.6 g/dL (ref 1.5–4.5)
Glucose: 148 mg/dL — ABNORMAL HIGH (ref 65–99)
POTASSIUM: 3.8 mmol/L (ref 3.5–5.2)
SODIUM: 143 mmol/L (ref 134–144)
Total Protein: 6.6 g/dL (ref 6.0–8.5)

## 2016-11-03 LAB — CBC WITH DIFFERENTIAL/PLATELET
BASOS ABS: 0 10*3/uL (ref 0.0–0.2)
BASOS: 1 %
EOS (ABSOLUTE): 0.2 10*3/uL (ref 0.0–0.4)
EOS: 3 %
HEMATOCRIT: 36.1 % (ref 34.0–46.6)
Hemoglobin: 12 g/dL (ref 11.1–15.9)
Immature Grans (Abs): 0 10*3/uL (ref 0.0–0.1)
Immature Granulocytes: 0 %
LYMPHS ABS: 2.3 10*3/uL (ref 0.7–3.1)
Lymphs: 30 %
MCH: 29.1 pg (ref 26.6–33.0)
MCHC: 33.2 g/dL (ref 31.5–35.7)
MCV: 88 fL (ref 79–97)
MONOCYTES: 7 %
MONOS ABS: 0.6 10*3/uL (ref 0.1–0.9)
Neutrophils Absolute: 4.6 10*3/uL (ref 1.4–7.0)
Neutrophils: 59 %
Platelets: 217 10*3/uL (ref 150–379)
RBC: 4.12 x10E6/uL (ref 3.77–5.28)
RDW: 14.4 % (ref 12.3–15.4)
WBC: 7.8 10*3/uL (ref 3.4–10.8)

## 2016-11-05 DIAGNOSIS — F3342 Major depressive disorder, recurrent, in full remission: Secondary | ICD-10-CM | POA: Diagnosis not present

## 2016-11-20 ENCOUNTER — Ambulatory Visit: Payer: Self-pay | Admitting: Orthopedic Surgery

## 2016-11-26 NOTE — Patient Instructions (Addendum)
Okla A Barkalow  11/26/2016   Your procedure is scheduled on: 12/07/16  Report to [facility] Main  Entrance .  Report to admitting at     11:15 AM   Call this number if you have problems the morning of surgery   [phone]   Remember: ONLY 1 PERSON MAY GO WITH YOU TO SHORT STAY TO GET  READY MORNING OF [place].  Do not eat food :After Midnight.  YOU MAY HAVE CLEAR LIQUIDS UNTIL 7:45 AM  THEN NOTHING BY MOUTH     Take these medicines the morning of surgery with A SIP OF WATER: Paxil, buspirone if needed, flonase if needed                                You may not have any metal on your body including hair pins and              piercings  Do not wear jewelry, make-up, lotions, powders or perfumes, deodorant             Do not wear nail polish.  Do not shave  48 hours prior to surgery.  .   Do not bring valuables to the hospital. [place].  Contacts, dentures or bridgework may not be worn into surgery.  Leave suitcase in the car. After surgery it may be brought to your room.                  Please read over the following fact sheets you were given: _____________________________________________________________________            [facility] - Preparing for Surgery Before surgery, you can play an important role.  Because skin is not sterile, your skin needs to be as free of germs as possible.  You can reduce the number of germs on your skin by washing with CHG (chlorahexidine gluconate) soap before surgery.  CHG is an antiseptic cleaner which kills germs and bonds with the skin to continue killing germs even after washing. Please DO NOT use if you have an allergy to CHG or antibacterial soaps.  If your skin becomes reddened/irritated stop using the CHG and inform your nurse when you arrive at Short Stay. Do not shave (including legs and underarms) for at least 48 hours prior to the first CHG shower.  You  may shave your face/neck. Please follow these instructions carefully:  1.  Shower with CHG Soap the night before surgery and the  morning of Surgery.  2.  If you choose to wash your hair, wash your hair first as usual with your  normal  shampoo.  3.  After you shampoo, rinse your hair and body thoroughly to remove the  shampoo.                           4.  Use CHG as you would any other liquid soap.  You can apply chg directly  to the skin and wash                       Gently with a scrungie or clean washcloth.  5.  Apply the CHG Soap  to your body ONLY FROM THE NECK DOWN.   Do not use on face/ open                           Wound or open sores. Avoid contact with eyes, ears mouth and genitals (private parts).                       Wash face,  Genitals (private parts) with your normal soap.             6.  Wash thoroughly, paying special attention to the area where your surgery  will be performed.  7.  Thoroughly rinse your body with warm water from the neck down.  8.  DO NOT shower/wash with your normal soap after using and rinsing off  the CHG Soap.                9.  Pat yourself dry with a clean towel.            10.  Wear clean pajamas.            11.  Place clean sheets on your bed the night of your first shower and do not  sleep with pets. Day of Surgery : Do not apply any lotions/deodorants the morning of surgery.  Please wear clean clothes to the hospital/surgery center.  FAILURE TO FOLLOW THESE INSTRUCTIONS MAY RESULT IN THE CANCELLATION OF YOUR SURGERY PATIENT SIGNATURE_________________________________  NURSE SIGNATURE__________________________________  ________________________________________________________________________  WHAT IS A BLOOD TRANSFUSION? Blood Transfusion Information  A transfusion is the replacement of blood or some of its parts. Blood is made up of multiple cells which provide different functions.  Red blood cells carry oxygen and are used for blood loss  replacement.  White blood cells fight against infection.  Platelets control bleeding.  Plasma helps clot blood.  Other blood products are available for specialized needs, such as hemophilia or other clotting disorders. BEFORE THE TRANSFUSION  Who gives blood for transfusions?   Healthy volunteers who are fully evaluated to make sure their blood is safe. This is blood bank blood. Transfusion therapy is the safest it has ever been in the practice of medicine. Before blood is taken from a donor, a complete history is taken to make sure that person has no history of diseases nor engages in risky social behavior (examples are intravenous drug use or sexual activity with multiple partners). The donor's travel history is screened to minimize risk of transmitting infections, such as malaria. The donated blood is tested for signs of infectious diseases, such as HIV and hepatitis. The blood is then tested to be sure it is compatible with you in order to minimize the chance of a transfusion reaction. If you or a relative donates blood, this is often done in anticipation of surgery and is not appropriate for emergency situations. It takes many days to process the donated blood. RISKS AND COMPLICATIONS Although transfusion therapy is very safe and saves many lives, the main dangers of transfusion include:   Getting an infectious disease.  Developing a transfusion reaction. This is an allergic reaction to something in the blood you were given. Every precaution is taken to prevent this. The decision to have a blood transfusion has been considered carefully by your caregiver before blood is given. Blood is not given unless the benefits outweigh the risks. AFTER THE TRANSFUSION  Right after receiving a blood transfusion, you will  usually feel much better and more energetic. This is especially true if your red blood cells have gotten low (anemic). The transfusion raises the level of the red blood cells which  carry oxygen, and this usually causes an energy increase.  The nurse administering the transfusion will monitor you carefully for complications. HOME CARE INSTRUCTIONS  No special instructions are needed after a transfusion. You may find your energy is better. Speak with your caregiver about any limitations on activity for underlying diseases you may have. SEEK MEDICAL CARE IF:   Your condition is not improving after your transfusion.  You develop redness or irritation at the intravenous (IV) site. SEEK IMMEDIATE MEDICAL CARE IF:  Any of the following symptoms occur over the next 12 hours:  Shaking chills.  You have a temperature by mouth above 102 F (38.9 C), not controlled by medicine.  Chest, back, or muscle pain.  People around you feel you are not acting correctly or are confused.  Shortness of breath or difficulty breathing.  Dizziness and fainting.  You get a rash or develop hives.  You have a decrease in urine output.  Your urine turns a dark color or changes to pink, red, or brown. Any of the following symptoms occur over the next 10 days:  You have a temperature by mouth above 102 F (38.9 C), not controlled by medicine.  Shortness of breath.  Weakness after normal activity.  The white part of the eye turns yellow (jaundice).  You have a decrease in the amount of urine or are urinating less often.  Your urine turns a dark color or changes to pink, red, or brown. Document Released: 06/19/2000 Document Revised: 09/14/2011 Document Reviewed: 02/06/2008 ExitCare Patient Information 2014 [place].  _______________________________________________________________________  Incentive Spirometer  An incentive spirometer is a tool that can help keep your lungs clear and active. This tool measures how well you are filling your lungs with each breath. Taking long deep breaths may help reverse or decrease the chance of developing breathing (pulmonary) problems  (especially infection) following:  A long period of time when you are unable to move or be active. BEFORE THE PROCEDURE   If the spirometer includes an indicator to show your best effort, your nurse or respiratory therapist will set it to a desired goal.  If possible, sit up straight or lean slightly forward. Try not to slouch.  Hold the incentive spirometer in an upright position. INSTRUCTIONS FOR USE  1. Sit on the edge of your bed if possible, or sit up as far as you can in bed or on a chair. 2. Hold the incentive spirometer in an upright position. 3. Breathe out normally. 4. Place the mouthpiece in your mouth and seal your lips tightly around it. 5. Breathe in slowly and as deeply as possible, raising the piston or the ball toward the top of the column. 6. Hold your breath for 3-5 seconds or for as long as possible. Allow the piston or ball to fall to the bottom of the column. 7. Remove the mouthpiece from your mouth and breathe out normally. 8. Rest for a few seconds and repeat Steps 1 through 7 at least 10 times every 1-2 hours when you are awake. Take your time and take a few normal breaths between deep breaths. 9. The spirometer may include an indicator to show your best effort. Use the indicator as a goal to work toward during each repetition. 10. After each set of 10 deep breaths, practice coughing  to be sure your lungs are clear. If you have an incision (the cut made at the time of surgery), support your incision when coughing by placing a pillow or rolled up towels firmly against it. Once you are able to get out of bed, walk around indoors and cough well. You may stop using the incentive spirometer when instructed by your caregiver.  RISKS AND COMPLICATIONS  Take your time so you do not get dizzy or light-headed.  If you are in pain, you may need to take or ask for pain medication before doing incentive spirometry. It is harder to take a deep breath if you are having  pain. AFTER USE  Rest and breathe slowly and easily.  It can be helpful to keep track of a log of your progress. Your caregiver can provide you with a simple table to help with this. If you are using the spirometer at home, follow these instructions: [place] IF:   You are having difficultly using the spirometer.  You have trouble using the spirometer as often as instructed.  Your pain medication is not giving enough relief while using the spirometer.  You develop fever of 100.5 F (38.1 C) or higher. SEEK IMMEDIATE MEDICAL CARE IF:   You cough up bloody sputum that had not been present before.  You develop fever of 102 F (38.9 C) or greater.  You develop worsening pain at or near the incision site. MAKE SURE YOU:   Understand these instructions.  Will watch your condition.  Will get help right away if you are not doing well or get worse. Document Released: 11/02/2006 Document Revised: 09/14/2011 Document Reviewed: 01/03/2007 ExitCare Patient Information 2014 ExitCare, [place].   ________________________________________________________________________    CLEAR LIQUID DIET   Foods Allowed                                                                     Foods Excluded  Coffee and tea, regular and decaf                             liquids that you cannot  Plain Jell-O in any flavor                                             see through such as: Fruit ices (not with fruit pulp)                                     milk, soups, orange juice  Iced Popsicles                                    All solid food Carbonated beverages, regular and diet                                    Cranberry, grape and apple juices Sports  drinks like Gatorade Lightly seasoned clear broth or consume(fat free) Sugar, honey syrup  Sample Menu Breakfast                                Lunch                                     Supper Cranberry juice                    Beef broth                             Chicken broth Jell-O                                     Grape juice                           Apple juice Coffee or tea                        Jell-O                                      Popsicle                                                Coffee or tea                        Coffee or tea  _____________________________________________________________________

## 2016-11-30 ENCOUNTER — Ambulatory Visit: Payer: Self-pay | Admitting: Orthopedic Surgery

## 2016-11-30 NOTE — H&P (Signed)
Courtney Avila DOB: 06-29-1941 Divorced / Language: English / Race: White Female Date of Admission:  12/07/2016 CC:  Right Knee Pain History of Present Illness The patient is a 76 year old female who comes in  for a preoperative History and Physical. The patient is scheduled for a right total knee arthroplasty to be performed by Dr. Dione Plover. Aluisio, MD at [facility] on 12-07-2016. The patient is a 76 year old female who presented for follow up of their knee. The patient is being followed for their right knee pain and osteoarthritis (end-stage). They are now month(s) out from right knee cortisone injection, which only gave relief for 2 months. Symptoms reported include: pain. The patient feels that they are doing poorly and report their pain level to be moderate. The patient has not gotten any relief of their symptoms with Cortisone injections (no help). Patient was seen in referral from [place]. Courtney Avila has had problems with her right knee for many years now. It has gotten progressively worse. She is hurting at all times. It is limiting what she can and cannot do. She has had increasing valgus deformity of the knee and the knee is now unstable because of the deformity. She is concerned about falling. She has had cortisone injections in the past without benefit. Currently the pain and dysfunction are equally problematic for her. She has seen Dr. Lyla Glassing who had recommended knee replacement. She does not have any problems with her hips. Her left knee currently does not bother her. Radiographs taken today, AP and lateral of the right knee show bone on bone arthritis in the lateral and patellofemoral compartments with significant valgus deformity. She has got advanced arthritic change in the right knee with progressive pain and dysfunction. At this point the most predictive means of improving pain and function is total knee arthroplasty. She has had injections which have failed. She is ready to go  ahead with surgery. They have been treated conservatively in the past for the above stated problem and despite conservative measures, they continue to have progressive pain and severe functional limitations and dysfunction. They have failed non-operative management including home exercise, medications, and injections. It is felt that they would benefit from undergoing total joint replacement. Risks and benefits of the procedure have been discussed with the patient and they elect to proceed with surgery. There are no active contraindications to surgery such as ongoing infection or rapidly progressive neurological disease.  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)  Impaired Vision  Hemorrhoids  Rheumatoid Arthritis   Allergies  No Known Drug Allergies   Family History Cancer  mother, father and sister Depression  mother Diabetes Mellitus  mother Heart Disease  father Osteoarthritis  sister  Social History Children  4 Tobacco use  09/10/2015: never smoker 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 PARoxetine HCl (20MG  Tablet, Oral) Active. Temazepam (15MG  Capsule, Oral) Active. Hair Skin Nails (Oral) Active.   Past  Surgical History Breast Biopsy  left Carpal Tunnel Repair  bilateral Hysterectomy  partial  (non-cancerous) Right Total Reverse Shoulder Replacement    Review of Systems  General Not Present- Chills, Fatigue, Fever, Memory Loss, Night Sweats, Weight Gain and Weight Loss. Skin Not Present- Eczema, Hives, Itching, Lesions and Rash. HEENT Not Present- Dentures, Double Vision, Headache, Hearing Loss, Tinnitus and Visual Loss. Respiratory Not Present- Allergies, Chronic Cough, Coughing up blood, Shortness of breath at rest and Shortness of breath with exertion. Cardiovascular Not Present- Chest Pain, Difficulty Breathing Lying Down, Murmur, Palpitations, Racing/skipping heartbeats and Swelling. Gastrointestinal Present- Heartburn. Not Present- Abdominal Pain, Bloody Stool, Constipation, Diarrhea, Difficulty Swallowing, Jaundice, Loss of appetitie, Nausea and Vomiting. Female Genitourinary Not Present- Blood in Urine, Discharge, Flank Pain, Incontinence, Painful Urination, Urgency, Urinary frequency, Urinary Retention, Urinating at Night and Weak urinary stream. Musculoskeletal Present- Joint Swelling. Not Present- Back Pain, Joint Pain, Morning Stiffness, Muscle Pain, Muscle Weakness and Spasms. Neurological Not Present- Blackout spells, Difficulty with balance, Dizziness, Paralysis, Tremor and Weakness. Psychiatric Not Present- Insomnia.  Vitals Weight: 160 lb Height: 59in Weight was reported by patient. Height was reported by patient. Body Surface Area: 1.68 m Body Mass Index: 32.32 kg/m  Pulse: 88 (Regular)  BP: 138/78 (Sitting, Right Arm, Standard)  Physical Exam  General Mental Status -Alert, cooperative and good historian. General Appearance-pleasant, Not in acute distress. Orientation-Oriented X3. Build & Nutrition-Well nourished and Well developed.  Head and Neck Head-normocephalic, atraumatic . Neck Global Assessment - supple, no bruit auscultated on the right, no bruit auscultated on the left.  Eye Vision-Wears corrective lenses. Pupil -  Bilateral-Regular and Round. Motion - Bilateral-EOMI.  ENMT Note: lower denture plate   Chest and Lung Exam Auscultation Breath sounds - clear at anterior chest wall and clear at posterior chest wall. Adventitious sounds - No Adventitious sounds.  Cardiovascular Auscultation Rhythm - Regular rate and rhythm. Heart Sounds - S1 WNL and S2 WNL. Murmurs & Other Heart Sounds - Auscultation of the heart reveals - No Murmurs.  Abdomen Palpation/Percussion Tenderness - Abdomen is non-tender to palpation. Rigidity (guarding) - Abdomen is soft. Auscultation Auscultation of the abdomen reveals - Bowel sounds normal.  Female Genitourinary Note: Not done, not pertinent to present illness   Musculoskeletal Note: She is a well-developed female, alert and oriented, in no apparent distress. Evaluation of her hips show normal range of motion with no discomfort. Her left knee shows no effusion. Her range of motion in the left knee is 0 to 135 with no tenderness or instability. Her right knee shows no effusion. She does have a valgus deformity of the right knee of about 10 degrees. She has range of motion of about 5 to 125. There is marked crepitus on range of motion. She is tender lateral greater than medial with no instability. She does have a significant antalgic gait pattern and is very unsteady gait.  IMAGING Radiographs taken today, AP and lateral of the right knee show bone on bone arthritis in the lateral and patellofemoral compartments with significant valgus deformity.  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  PCP: [facility]  IV TXA  Anesthesia Issues: None  Patient was instructed on what medications to stop prior to surgery.  Signed electronically by Joelene Millin, III PA-C

## 2016-12-01 ENCOUNTER — Encounter (HOSPITAL_COMMUNITY): Payer: Self-pay

## 2016-12-01 ENCOUNTER — Encounter (HOSPITAL_COMMUNITY)
Admission: RE | Admit: 2016-12-01 | Discharge: 2016-12-01 | Disposition: A | Discharge status: Home / Self Care | Payer: Medicare Other | Source: Ambulatory Visit | Attending: Orthopedic Surgery | Admitting: Orthopedic Surgery

## 2016-12-01 DIAGNOSIS — M1711 Unilateral primary osteoarthritis, right knee: Secondary | ICD-10-CM | POA: Diagnosis not present

## 2016-12-01 DIAGNOSIS — Z01812 Encounter for preprocedural laboratory examination: Secondary | ICD-10-CM | POA: Insufficient documentation

## 2016-12-01 LAB — COMPREHENSIVE METABOLIC PANEL
ALK PHOS: 59 U/L (ref 38–126)
ALT: 15 U/L (ref 14–54)
AST: 25 U/L (ref 15–41)
Albumin: 3.8 g/dL (ref 3.5–5.0)
Anion gap: 7 (ref 5–15)
BILIRUBIN TOTAL: 0.5 mg/dL (ref 0.3–1.2)
BUN: 26 mg/dL — AB (ref 6–20)
CALCIUM: 9.2 mg/dL (ref 8.9–10.3)
CO2: 26 mmol/L (ref 22–32)
Chloride: 106 mmol/L (ref 101–111)
Creatinine, Ser: 1 mg/dL (ref 0.44–1.00)
GFR calc Af Amer: >60 mL/min (ref >60)
GFR calc non Af Amer: 53 mL/min — ABNORMAL LOW (ref >60)
GLUCOSE: 137 mg/dL — AB (ref 65–99)
Potassium: 4.2 mmol/L (ref 3.5–5.1)
Sodium: 139 mmol/L (ref 135–145)
TOTAL PROTEIN: 7.1 g/dL (ref 6.5–8.1)

## 2016-12-01 LAB — PROTIME-INR
INR: 1.02
Prothrombin Time: 13.5 seconds (ref 11.4–15.2)

## 2016-12-01 LAB — SURGICAL PCR SCREEN
MRSA, PCR: NEGATIVE
Staphylococcus aureus: NEGATIVE

## 2016-12-01 LAB — CBC
HEMATOCRIT: 38.1 % (ref 36.0–46.0)
HEMOGLOBIN: 12.2 g/dL (ref 12.0–15.0)
MCH: 28.7 pg (ref 26.0–34.0)
MCHC: 32 g/dL (ref 30.0–36.0)
MCV: 89.6 fL (ref 78.0–100.0)
Platelets: 244 10*3/uL (ref 150–400)
RBC: 4.25 MIL/uL (ref 3.87–5.11)
RDW: 13.6 % (ref 11.5–15.5)
WBC: 7.1 10*3/uL (ref 4.0–10.5)

## 2016-12-01 LAB — APTT: APTT: 35 s (ref 24–36)

## 2016-12-01 LAB — ABO/RH: ABO/RH(D): O NEG

## 2016-12-01 NOTE — Progress Notes (Signed)
cmp done 12/01/16 routed to Dr. Wynelle Link via epic

## 2016-12-01 NOTE — Progress Notes (Signed)
cxr 12/02/16  And ekg 11/02/16 in epic

## 2016-12-07 ENCOUNTER — Encounter (HOSPITAL_COMMUNITY): Payer: Self-pay | Admitting: *Deleted

## 2016-12-07 ENCOUNTER — Inpatient Hospital Stay (HOSPITAL_COMMUNITY): Payer: Medicare Other | Admitting: Anesthesiology

## 2016-12-07 ENCOUNTER — Encounter (HOSPITAL_COMMUNITY)
Admission: RE | Disposition: A | Discharge status: Home / Self Care | Payer: Self-pay | Source: Ambulatory Visit | Attending: Orthopedic Surgery

## 2016-12-07 ENCOUNTER — Inpatient Hospital Stay (HOSPITAL_COMMUNITY)
Admission: RE | Admit: 2016-12-07 | Discharge: 2016-12-09 | DRG: 470 | Disposition: A | Discharge status: Home / Self Care | Payer: Medicare Other | Source: Ambulatory Visit | Attending: Orthopedic Surgery | Admitting: Orthopedic Surgery

## 2016-12-07 DIAGNOSIS — Z833 Family history of diabetes mellitus: Secondary | ICD-10-CM | POA: Diagnosis not present

## 2016-12-07 DIAGNOSIS — M1710 Unilateral primary osteoarthritis, unspecified knee: Secondary | ICD-10-CM | POA: Diagnosis present

## 2016-12-07 DIAGNOSIS — M21061 Valgus deformity, not elsewhere classified, right knee: Secondary | ICD-10-CM | POA: Diagnosis present

## 2016-12-07 DIAGNOSIS — Z8249 Family history of ischemic heart disease and other diseases of the circulatory system: Secondary | ICD-10-CM | POA: Diagnosis not present

## 2016-12-07 DIAGNOSIS — K219 Gastro-esophageal reflux disease without esophagitis: Secondary | ICD-10-CM | POA: Diagnosis present

## 2016-12-07 DIAGNOSIS — Z96611 Presence of right artificial shoulder joint: Secondary | ICD-10-CM | POA: Diagnosis present

## 2016-12-07 DIAGNOSIS — M1711 Unilateral primary osteoarthritis, right knee: Principal | ICD-10-CM | POA: Diagnosis present

## 2016-12-07 DIAGNOSIS — G8918 Other acute postprocedural pain: Secondary | ICD-10-CM | POA: Diagnosis not present

## 2016-12-07 DIAGNOSIS — M179 Osteoarthritis of knee, unspecified: Secondary | ICD-10-CM

## 2016-12-07 HISTORY — PX: TOTAL KNEE ARTHROPLASTY: SHX125

## 2016-12-07 LAB — TYPE AND SCREEN
ABO/RH(D): O NEG
Antibody Screen: NEGATIVE

## 2016-12-07 SURGERY — ARTHROPLASTY, KNEE, TOTAL
Anesthesia: Spinal | Site: Knee | Laterality: Right

## 2016-12-07 MED ORDER — PAROXETINE HCL 20 MG PO TABS
40.0000 mg | ORAL_TABLET | Freq: Every day | ORAL | Status: DC
  Administered 2016-12-08 – 2016-12-09 (×2): 40 mg via ORAL
  Filled 2016-12-07 (×2): qty 2

## 2016-12-07 MED ORDER — ACETAMINOPHEN 500 MG PO TABS
1000.0000 mg | ORAL_TABLET | Freq: Four times a day (QID) | ORAL | Status: AC
  Administered 2016-12-07 – 2016-12-08 (×3): 1000 mg via ORAL
  Filled 2016-12-07 (×4): qty 2

## 2016-12-07 MED ORDER — BISACODYL 10 MG RE SUPP: 10.0000 mg | Freq: Every day | RECTAL | Status: DC | PRN

## 2016-12-07 MED ORDER — TRANEXAMIC ACID 1000 MG/10ML IV SOLN
1000.0000 mg | Freq: Once | INTRAVENOUS | Status: AC
  Administered 2016-12-07: 1000 mg via INTRAVENOUS
  Filled 2016-12-07: qty 1100

## 2016-12-07 MED ORDER — SODIUM CHLORIDE 0.9 % IJ SOLN
INTRAMUSCULAR | Status: AC
  Filled 2016-12-07: qty 50

## 2016-12-07 MED ORDER — EPHEDRINE 5 MG/ML INJ
INTRAVENOUS | Status: AC
  Filled 2016-12-07: qty 10

## 2016-12-07 MED ORDER — PROPOFOL 10 MG/ML IV BOLUS
INTRAVENOUS | Status: DC | PRN
  Administered 2016-12-07: 20 mg via INTRAVENOUS

## 2016-12-07 MED ORDER — EPHEDRINE SULFATE 50 MG/ML IJ SOLN
INTRAMUSCULAR | Status: DC | PRN
  Administered 2016-12-07: 5 mg via INTRAVENOUS
  Administered 2016-12-07: 10 mg via INTRAVENOUS
  Administered 2016-12-07 (×7): 5 mg via INTRAVENOUS

## 2016-12-07 MED ORDER — DOCUSATE SODIUM 100 MG PO CAPS
100.0000 mg | ORAL_CAPSULE | Freq: Two times a day (BID) | ORAL | Status: DC
  Administered 2016-12-07 – 2016-12-09 (×4): 100 mg via ORAL
  Filled 2016-12-07 (×4): qty 1

## 2016-12-07 MED ORDER — BUPIVACAINE IN DEXTROSE 0.75-8.25 % IT SOLN
INTRATHECAL | Status: DC | PRN
  Administered 2016-12-07: 13.5 mg via INTRATHECAL

## 2016-12-07 MED ORDER — METOCLOPRAMIDE HCL 5 MG PO TABS: 5.0000 mg | ORAL_TABLET | Freq: Three times a day (TID) | ORAL | Status: DC | PRN

## 2016-12-07 MED ORDER — BUPIVACAINE LIPOSOME 1.3 % IJ SUSP
INTRAMUSCULAR | Status: DC | PRN
  Administered 2016-12-07: 20 mL

## 2016-12-07 MED ORDER — HYDROMORPHONE HCL 1 MG/ML IJ SOLN: 0.2500 mg | INTRAMUSCULAR | Status: DC | PRN

## 2016-12-07 MED ORDER — FENTANYL CITRATE (PF) 100 MCG/2ML IJ SOLN
100.0000 ug | Freq: Once | INTRAMUSCULAR | Status: AC
  Administered 2016-12-07: 50 ug via INTRAVENOUS

## 2016-12-07 MED ORDER — ONDANSETRON HCL 4 MG PO TABS: 4.0000 mg | ORAL_TABLET | Freq: Four times a day (QID) | ORAL | Status: DC | PRN

## 2016-12-07 MED ORDER — DEXAMETHASONE SODIUM PHOSPHATE 10 MG/ML IJ SOLN
10.0000 mg | Freq: Once | INTRAMUSCULAR | Status: AC
  Administered 2016-12-08: 10 mg via INTRAVENOUS
  Filled 2016-12-07: qty 1

## 2016-12-07 MED ORDER — ONDANSETRON HCL 4 MG/2ML IJ SOLN
INTRAMUSCULAR | Status: DC | PRN
  Administered 2016-12-07: 4 mg via INTRAVENOUS

## 2016-12-07 MED ORDER — METOCLOPRAMIDE HCL 5 MG/ML IJ SOLN: 5.0000 mg | Freq: Three times a day (TID) | INTRAMUSCULAR | Status: DC | PRN

## 2016-12-07 MED ORDER — ACETAMINOPHEN 325 MG PO TABS: 650.0000 mg | ORAL_TABLET | Freq: Four times a day (QID) | ORAL | Status: DC | PRN

## 2016-12-07 MED ORDER — CHLORHEXIDINE GLUCONATE 4 % EX LIQD: 60.0000 mL | Freq: Once | CUTANEOUS | Status: DC

## 2016-12-07 MED ORDER — METHOCARBAMOL 500 MG PO TABS: 500.0000 mg | ORAL_TABLET | Freq: Four times a day (QID) | ORAL | Status: DC | PRN

## 2016-12-07 MED ORDER — ROPIVACAINE HCL 5 MG/ML IJ SOLN
INTRAMUSCULAR | Status: DC | PRN
  Administered 2016-12-07: 20 mL via PERINEURAL

## 2016-12-07 MED ORDER — ACETAMINOPHEN 650 MG RE SUPP: 650.0000 mg | Freq: Four times a day (QID) | RECTAL | Status: DC | PRN

## 2016-12-07 MED ORDER — FLEET ENEMA 7-19 GM/118ML RE ENEM: 1.0000 | ENEMA | Freq: Once | RECTAL | Status: DC | PRN

## 2016-12-07 MED ORDER — CEFAZOLIN SODIUM-DEXTROSE 2-4 GM/100ML-% IV SOLN
2.0000 g | INTRAVENOUS | Status: AC
  Administered 2016-12-07: 2 g via INTRAVENOUS

## 2016-12-07 MED ORDER — CEFAZOLIN SODIUM-DEXTROSE 2-4 GM/100ML-% IV SOLN
INTRAVENOUS | Status: AC
  Filled 2016-12-07: qty 100

## 2016-12-07 MED ORDER — SODIUM CHLORIDE 0.9 % IV SOLN
INTRAVENOUS | Status: DC
  Administered 2016-12-07 – 2016-12-08 (×2): via INTRAVENOUS

## 2016-12-07 MED ORDER — PHENOL 1.4 % MT LIQD
1.0000 | OROMUCOSAL | Status: DC | PRN
  Filled 2016-12-07: qty 177

## 2016-12-07 MED ORDER — BUSPIRONE HCL 5 MG PO TABS: 10.0000 mg | ORAL_TABLET | Freq: Every day | ORAL | Status: DC | PRN

## 2016-12-07 MED ORDER — MIDAZOLAM HCL 2 MG/2ML IJ SOLN
2.0000 mg | Freq: Once | INTRAMUSCULAR | Status: AC
  Administered 2016-12-07: 1 mg via INTRAVENOUS

## 2016-12-07 MED ORDER — ONDANSETRON HCL 4 MG/2ML IJ SOLN: 4.0000 mg | Freq: Four times a day (QID) | INTRAMUSCULAR | Status: DC | PRN

## 2016-12-07 MED ORDER — MIDAZOLAM HCL 2 MG/2ML IJ SOLN
INTRAMUSCULAR | Status: AC
  Filled 2016-12-07: qty 2

## 2016-12-07 MED ORDER — SODIUM CHLORIDE 0.9 % IJ SOLN
INTRAMUSCULAR | Status: AC
  Filled 2016-12-07: qty 10

## 2016-12-07 MED ORDER — LIDOCAINE HCL (CARDIAC) 20 MG/ML IV SOLN
INTRAVENOUS | Status: DC | PRN
  Administered 2016-12-07: 20 mg via INTRAVENOUS

## 2016-12-07 MED ORDER — OXYCODONE HCL 5 MG PO TABS
5.0000 mg | ORAL_TABLET | ORAL | Status: DC | PRN
  Administered 2016-12-07 (×2): 5 mg via ORAL
  Administered 2016-12-08: 12:00:00 10 mg via ORAL
  Administered 2016-12-08: 06:00:00 5 mg via ORAL
  Administered 2016-12-08 – 2016-12-09 (×5): 10 mg via ORAL
  Filled 2016-12-07 (×6): qty 2
  Filled 2016-12-07: qty 1
  Filled 2016-12-07: qty 2
  Filled 2016-12-07 (×2): qty 1

## 2016-12-07 MED ORDER — MIDAZOLAM HCL 5 MG/5ML IJ SOLN
INTRAMUSCULAR | Status: DC | PRN
  Administered 2016-12-07: 1 mg via INTRAVENOUS

## 2016-12-07 MED ORDER — ACETAMINOPHEN 10 MG/ML IV SOLN
1000.0000 mg | Freq: Once | INTRAVENOUS | Status: AC
  Administered 2016-12-07: 1000 mg via INTRAVENOUS

## 2016-12-07 MED ORDER — METHOCARBAMOL 1000 MG/10ML IJ SOLN
500.0000 mg | Freq: Four times a day (QID) | INTRAVENOUS | Status: DC | PRN
  Administered 2016-12-07: 500 mg via INTRAVENOUS
  Filled 2016-12-07: qty 550

## 2016-12-07 MED ORDER — PROPOFOL 10 MG/ML IV BOLUS
INTRAVENOUS | Status: AC
  Filled 2016-12-07: qty 40

## 2016-12-07 MED ORDER — CEFAZOLIN SODIUM-DEXTROSE 2-4 GM/100ML-% IV SOLN
2.0000 g | Freq: Four times a day (QID) | INTRAVENOUS | Status: AC
  Administered 2016-12-07 – 2016-12-08 (×2): 2 g via INTRAVENOUS
  Filled 2016-12-07 (×2): qty 100

## 2016-12-07 MED ORDER — SODIUM CHLORIDE 0.9 % IJ SOLN
INTRAMUSCULAR | Status: DC | PRN
  Administered 2016-12-07: 60 mL

## 2016-12-07 MED ORDER — TEMAZEPAM 15 MG PO CAPS
30.0000 mg | ORAL_CAPSULE | Freq: Every day | ORAL | Status: DC
  Administered 2016-12-07 – 2016-12-08 (×2): 30 mg via ORAL
  Filled 2016-12-07 (×2): qty 2

## 2016-12-07 MED ORDER — ACETAMINOPHEN 10 MG/ML IV SOLN
INTRAVENOUS | Status: AC
  Filled 2016-12-07: qty 100

## 2016-12-07 MED ORDER — MENTHOL 3 MG MT LOZG: 1.0000 | LOZENGE | OROMUCOSAL | Status: DC | PRN

## 2016-12-07 MED ORDER — BUPIVACAINE LIPOSOME 1.3 % IJ SUSP
20.0000 mL | Freq: Once | INTRAMUSCULAR | Status: DC
  Filled 2016-12-07: qty 20

## 2016-12-07 MED ORDER — LACTATED RINGERS IV SOLN
INTRAVENOUS | Status: DC
  Administered 2016-12-07 (×4): via INTRAVENOUS

## 2016-12-07 MED ORDER — PHENYLEPHRINE HCL 10 MG/ML IJ SOLN
INTRAMUSCULAR | Status: AC
  Filled 2016-12-07: qty 1

## 2016-12-07 MED ORDER — DEXAMETHASONE SODIUM PHOSPHATE 10 MG/ML IJ SOLN
10.0000 mg | Freq: Once | INTRAMUSCULAR | Status: AC
  Administered 2016-12-07: 10 mg via INTRAVENOUS

## 2016-12-07 MED ORDER — TRANEXAMIC ACID 1000 MG/10ML IV SOLN
1000.0000 mg | INTRAVENOUS | Status: AC
  Administered 2016-12-07: 1000 mg via INTRAVENOUS
  Filled 2016-12-07: qty 1100

## 2016-12-07 MED ORDER — POLYETHYLENE GLYCOL 3350 17 G PO PACK: 17.0000 g | PACK | Freq: Every day | ORAL | Status: DC | PRN

## 2016-12-07 MED ORDER — FLUTICASONE PROPIONATE 50 MCG/ACT NA SUSP
1.0000 | Freq: Every day | NASAL | Status: DC | PRN
  Filled 2016-12-07: qty 16

## 2016-12-07 MED ORDER — SODIUM CHLORIDE 0.9 % IR SOLN
Status: DC | PRN
  Administered 2016-12-07: 1000 mL

## 2016-12-07 MED ORDER — PHENYLEPHRINE HCL 10 MG/ML IJ SOLN
INTRAMUSCULAR | Status: DC | PRN
  Administered 2016-12-07: 20 ug/min via INTRAVENOUS

## 2016-12-07 MED ORDER — DIPHENHYDRAMINE HCL 12.5 MG/5ML PO ELIX: 12.5000 mg | ORAL_SOLUTION | ORAL | Status: DC | PRN

## 2016-12-07 MED ORDER — PROPOFOL 500 MG/50ML IV EMUL
INTRAVENOUS | Status: DC | PRN
  Administered 2016-12-07: 75 ug/kg/min via INTRAVENOUS

## 2016-12-07 MED ORDER — FENTANYL CITRATE (PF) 100 MCG/2ML IJ SOLN
INTRAMUSCULAR | Status: AC
  Filled 2016-12-07: qty 2

## 2016-12-07 MED ORDER — MORPHINE SULFATE (PF) 2 MG/ML IV SOLN: 1.0000 mg | INTRAVENOUS | Status: DC | PRN

## 2016-12-07 MED ORDER — RIVAROXABAN 10 MG PO TABS
10.0000 mg | ORAL_TABLET | Freq: Every day | ORAL | Status: DC
  Administered 2016-12-08 – 2016-12-09 (×2): 10 mg via ORAL
  Filled 2016-12-07 (×2): qty 1

## 2016-12-07 SURGICAL SUPPLY — 49 items
BAG DECANTER FOR FLEXI CONT (MISCELLANEOUS) ×3 IMPLANT
BAG ZIPLOCK 12X15 (MISCELLANEOUS) ×3 IMPLANT
BANDAGE ACE 6X5 VEL STRL LF (GAUZE/BANDAGES/DRESSINGS) ×3 IMPLANT
BLADE SAG 18X100X1.27 (BLADE) ×3 IMPLANT
BLADE SAW SGTL 11.0X1.19X90.0M (BLADE) ×3 IMPLANT
BOWL SMART MIX CTS (DISPOSABLE) ×3 IMPLANT
CAP KNEE TOTAL 3 SIGMA ×3 IMPLANT
CEMENT HV SMART SET (Cement) ×6 IMPLANT
CLOSURE WOUND 1/2 X4 (GAUZE/BANDAGES/DRESSINGS) ×1
COVER SURGICAL LIGHT HANDLE (MISCELLANEOUS) ×3 IMPLANT
CUFF TOURN SGL QUICK 34 (TOURNIQUET CUFF) ×2
CUFF TRNQT CYL 34X4X40X1 (TOURNIQUET CUFF) ×1 IMPLANT
DECANTER SPIKE VIAL GLASS SM (MISCELLANEOUS) ×3 IMPLANT
DRAPE U-SHAPE 47X51 STRL (DRAPES) ×3 IMPLANT
DRSG ADAPTIC 3X8 NADH LF (GAUZE/BANDAGES/DRESSINGS) ×3 IMPLANT
DRSG PAD ABDOMINAL 8X10 ST (GAUZE/BANDAGES/DRESSINGS) ×3 IMPLANT
DURAPREP 26ML APPLICATOR (WOUND CARE) ×3 IMPLANT
ELECT REM PT RETURN 15FT ADLT (MISCELLANEOUS) ×3 IMPLANT
EVACUATOR 1/8 PVC DRAIN (DRAIN) ×3 IMPLANT
GAUZE SPONGE 4X4 12PLY STRL (GAUZE/BANDAGES/DRESSINGS) ×3 IMPLANT
GLOVE BIO SURGEON STRL SZ7.5 (GLOVE) IMPLANT
GLOVE BIO SURGEON STRL SZ8 (GLOVE) ×3 IMPLANT
GLOVE BIOGEL PI IND STRL 6.5 (GLOVE) IMPLANT
GLOVE BIOGEL PI IND STRL 8 (GLOVE) ×1 IMPLANT
GLOVE BIOGEL PI INDICATOR 6.5 (GLOVE)
GLOVE BIOGEL PI INDICATOR 8 (GLOVE) ×2
GLOVE SURG SS PI 6.5 STRL IVOR (GLOVE) IMPLANT
GOWN STRL REUS W/TWL LRG LVL3 (GOWN DISPOSABLE) ×3 IMPLANT
GOWN STRL REUS W/TWL XL LVL3 (GOWN DISPOSABLE) IMPLANT
HANDPIECE INTERPULSE COAX TIP (DISPOSABLE) ×2
IMMOBILIZER KNEE 20 (SOFTGOODS) ×3
IMMOBILIZER KNEE 20 THIGH 36 (SOFTGOODS) ×1 IMPLANT
MANIFOLD NEPTUNE II (INSTRUMENTS) ×3 IMPLANT
NS IRRIG 1000ML POUR BTL (IV SOLUTION) ×3 IMPLANT
PACK TOTAL KNEE CUSTOM (KITS) ×3 IMPLANT
PADDING CAST COTTON 6X4 STRL (CAST SUPPLIES) ×6 IMPLANT
POSITIONER SURGICAL ARM (MISCELLANEOUS) ×3 IMPLANT
SET HNDPC FAN SPRY TIP SCT (DISPOSABLE) ×1 IMPLANT
STRIP CLOSURE SKIN 1/2X4 (GAUZE/BANDAGES/DRESSINGS) ×2 IMPLANT
SUT MNCRL AB 4-0 PS2 18 (SUTURE) ×3 IMPLANT
SUT STRATAFIX 0 PDS 27 VIOLET (SUTURE) ×3
SUT VIC AB 2-0 CT1 27 (SUTURE) ×6
SUT VIC AB 2-0 CT1 TAPERPNT 27 (SUTURE) ×3 IMPLANT
SUTURE STRATFX 0 PDS 27 VIOLET (SUTURE) ×1 IMPLANT
SYR 50ML LL SCALE MARK (SYRINGE) IMPLANT
TRAY FOLEY W/METER SILVER 16FR (SET/KITS/TRAYS/PACK) ×3 IMPLANT
WATER STERILE IRR 1000ML POUR (IV SOLUTION) ×6 IMPLANT
WRAP KNEE MAXI GEL POST OP (GAUZE/BANDAGES/DRESSINGS) ×3 IMPLANT
YANKAUER SUCT BULB TIP 10FT TU (MISCELLANEOUS) ×3 IMPLANT

## 2016-12-07 NOTE — Op Note (Signed)
OPERATIVE REPORT-TOTAL KNEE ARTHROPLASTY   Pre-operative diagnosis- Osteoarthritis  Right knee(s)  Post-operative diagnosis- Osteoarthritis Right knee(s)  Procedure-  Right  Total Knee Arthroplasty  Surgeon- Dione Plover. Kenley Troop, MD  Assistant- Arlee Muslim, PA-C   Anesthesia-  Adductor canal block and spinal  EBL-* No blood loss amount entered *   Drains Hemovac  Tourniquet time-35 minutes @ 941 mm Hg  Complications- None  Condition-PACU - hemodynamically stable.   Brief Clinical Note  Courtney Avila is a 76 y.o. year old female with end stage OA of her right knee with progressively worsening pain and dysfunction. She has constant pain, with activity and at rest and significant functional deficits with difficulties even with ADLs. She has had extensive non-op management including analgesics, injections of cortisone and viscosupplements, and home exercise program, but remains in significant pain with significant dysfunction.Radiographs show bone on bone arthritis lateral and patellofemoral. She presents now for right Total Knee Arthroplasty.    Procedure in detail---   The patient is brought into the operating room and positioned supine on the operating table. After successful administration of  Adductor canal block and spinal,   a tourniquet is placed high on the  Right thigh(s) and the lower extremity is prepped and draped in the usual sterile fashion. Time out is performed by the operating team and then the  Right lower extremity is wrapped in Esmarch, knee flexed and the tourniquet inflated to 300 mmHg.       A midline incision is made with a ten blade through the subcutaneous tissue to the level of the extensor mechanism. A fresh blade is used to make a medial parapatellar arthrotomy. Soft tissue over the proximal medial tibia is subperiosteally elevated to the joint line with a knife and into the semimembranosus bursa with a Cobb elevator. Soft tissue over the proximal lateral  tibia is elevated with attention being paid to avoiding the patellar tendon on the tibial tubercle. The patella is everted, knee flexed 90 degrees and the ACL and PCL are removed. Findings are bone on bone lateral and patellofemoral.        The drill is used to create a starting hole in the distal femur and the canal is thoroughly irrigated with sterile saline to remove the fatty contents. The 5 degree Right  valgus alignment guide is placed into the femoral canal and the distal femoral cutting block is pinned to remove 10 mm off the distal femur. Resection is made with an oscillating saw.      The tibia is subluxed forward and the menisci are removed. The extramedullary alignment guide is placed referencing proximally at the medial aspect of the tibial tubercle and distally along the second metatarsal axis and tibial crest. The block is pinned to remove 37mm off the more deficient lateral  side. Resection is made with an oscillating saw. Size 2.5is the most appropriate size for the tibia and the proximal tibia is prepared with the modular drill and keel punch for that size.      The femoral sizing guide is placed and size 3 is most appropriate. Rotation is marked off the epicondylar axis and confirmed by creating a rectangular flexion gap at 90 degrees. The size 3 cutting block is pinned in this rotation and the anterior, posterior and chamfer cuts are made with the oscillating saw. The intercondylar block is then placed and that cut is made.      Trial size 2.5 tibial component, trial size 3 posterior stabilized  femur and a 12.5  mm posterior stabilized rotating platform insert trial is placed. Full extension is achieved with excellent varus/valgus and anterior/posterior balance throughout full range of motion. The patella is everted and thickness measured to be 21  mm. Free hand resection is taken to 12 mm, a 35 template is placed, lug holes are drilled, trial patella is placed, and it tracks normally.  Osteophytes are removed off the posterior femur with the trial in place. All trials are removed and the cut bone surfaces prepared with pulsatile lavage. Cement is mixed and once ready for implantation, the size 2.5 tibial implant, size  3 posterior stabilized femoral component, and the size 35 patella are cemented in place and the patella is held with the clamp. The trial insert is placed and the knee held in full extension. The Exparel (20 ml mixed with 60 ml saline) is injected into the extensor mechanism, posterior capsule, medial and lateral gutters and subcutaneous tissues.  All extruded cement is removed and once the cement is hard the permanent 12.5 mm posterior stabilized rotating platform insert is placed into the tibial tray.      The wound is copiously irrigated with saline solution and the extensor mechanism closed over a hemovac drain with #1 V-loc suture. The tourniquet is released for a total tourniquet time of 35  minutes. Flexion against gravity is 140 degrees and the patella tracks normally. Subcutaneous tissue is closed with 2.0 vicryl and subcuticular with running 4.0 Monocryl. The incision is cleaned and dried and steri-strips and a bulky sterile dressing are applied. The limb is placed into a knee immobilizer and the patient is awakened and transported to recovery in stable condition.      Please note that a surgical assistant was a medical necessity for this procedure in order to perform it in a safe and expeditious manner. Surgical assistant was necessary to retract the ligaments and vital neurovascular structures to prevent injury to them and also necessary for proper positioning of the limb to allow for anatomic placement of the prosthesis.   Dione Plover Titan Karner, MD    12/07/2016, 2:56 PM

## 2016-12-07 NOTE — Transfer of Care (Signed)
Immediate Anesthesia Transfer of Care Note  Patient: Courtney Avila  Procedure(s) Performed: Procedure(s): RIGHT TOTAL KNEE ARTHROPLASTY (Right)  Patient Location: PACU  Anesthesia Type:Spinal  Level of Consciousness: awake, alert  and oriented  Airway & Oxygen Therapy: Patient Spontanous Breathing and Patient connected to face mask oxygen  Post-op Assessment: Report given to RN and Post -op Vital signs reviewed and stable  Post vital signs: Reviewed and stable  Last Vitals:  Vitals:   12/07/16 1303 12/07/16 1304  BP:    Pulse: 61 63  Resp: (!) 0 (!) 8  Temp:      Last Pain:  Vitals:   12/07/16 1120  TempSrc: Oral      Patients Stated Pain Goal: 3 (46/56/81 2751)  Complications: No apparent anesthesia complications

## 2016-12-07 NOTE — H&P (View-Only) (Signed)
Courtney Avila DOB: 1940-10-14 Divorced / Language: English / Race: White Female Date of Admission:  12/07/2016 CC:  Right Knee Pain History of Present Illness The patient is a 76 year old female who comes in  for a preoperative History and Physical. The patient is scheduled for a right total knee arthroplasty to be performed by Dr. Dione Plover. Aluisio, MD at [facility] on 12-07-2016. The patient is a 76 year old female who presented for follow up of their knee. The patient is being followed for their right knee pain and osteoarthritis (end-stage). They are now month(s) out from right knee cortisone injection, which only gave relief for 2 months. Symptoms reported include: pain. The patient feels that they are doing poorly and report their pain level to be moderate. The patient has not gotten any relief of their symptoms with Cortisone injections (no help). Patient was seen in referral from [place]. Courtney Avila has had problems with her right knee for many years now. It has gotten progressively worse. She is hurting at all times. It is limiting what she can and cannot do. She has had increasing valgus deformity of the knee and the knee is now unstable because of the deformity. She is concerned about falling. She has had cortisone injections in the past without benefit. Currently the pain and dysfunction are equally problematic for her. She has seen Dr. Lyla Glassing who had recommended knee replacement. She does not have any problems with her hips. Her left knee currently does not bother her. Radiographs taken today, AP and lateral of the right knee show bone on bone arthritis in the lateral and patellofemoral compartments with significant valgus deformity. She has got advanced arthritic change in the right knee with progressive pain and dysfunction. At this point the most predictive means of improving pain and function is total knee arthroplasty. She has had injections which have failed. She is ready to go  ahead with surgery. They have been treated conservatively in the past for the above stated problem and despite conservative measures, they continue to have progressive pain and severe functional limitations and dysfunction. They have failed non-operative management including home exercise, medications, and injections. It is felt that they would benefit from undergoing total joint replacement. Risks and benefits of the procedure have been discussed with the patient and they elect to proceed with surgery. There are no active contraindications to surgery such as ongoing infection or rapidly progressive neurological disease.  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)  Impaired Vision  Hemorrhoids  Rheumatoid Arthritis   Allergies  No Known Drug Allergies   Family History Cancer  mother, father and sister Depression  mother Diabetes Mellitus  mother Heart Disease  father Osteoarthritis  sister  Social History Children  4 Tobacco use  09/10/2015: never smoker 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 PARoxetine HCl (20MG  Tablet, Oral) Active. Temazepam (15MG  Capsule, Oral) Active. Hair Skin Nails (Oral) Active.   Past  Surgical History Breast Biopsy  left Carpal Tunnel Repair  bilateral Hysterectomy  partial  (non-cancerous) Right Total Reverse Shoulder Replacement    Review of Systems  General Not Present- Chills, Fatigue, Fever, Memory Loss, Night Sweats, Weight Gain and Weight Loss. Skin Not Present- Eczema, Hives, Itching, Lesions and Rash. HEENT Not Present- Dentures, Double Vision, Headache, Hearing Loss, Tinnitus and Visual Loss. Respiratory Not Present- Allergies, Chronic Cough, Coughing up blood, Shortness of breath at rest and Shortness of breath with exertion. Cardiovascular Not Present- Chest Pain, Difficulty Breathing Lying Down, Murmur, Palpitations, Racing/skipping heartbeats and Swelling. Gastrointestinal Present- Heartburn. Not Present- Abdominal Pain, Bloody Stool, Constipation, Diarrhea, Difficulty Swallowing, Jaundice, Loss of appetitie, Nausea and Vomiting. Female Genitourinary Not Present- Blood in Urine, Discharge, Flank Pain, Incontinence, Painful Urination, Urgency, Urinary frequency, Urinary Retention, Urinating at Night and Weak urinary stream. Musculoskeletal Present- Joint Swelling. Not Present- Back Pain, Joint Pain, Morning Stiffness, Muscle Pain, Muscle Weakness and Spasms. Neurological Not Present- Blackout spells, Difficulty with balance, Dizziness, Paralysis, Tremor and Weakness. Psychiatric Not Present- Insomnia.  Vitals Weight: 160 lb Height: 59in Weight was reported by patient. Height was reported by patient. Body Surface Area: 1.68 m Body Mass Index: 32.32 kg/m  Pulse: 88 (Regular)  BP: 138/78 (Sitting, Right Arm, Standard)  Physical Exam  General Mental Status -Alert, cooperative and good historian. General Appearance-pleasant, Not in acute distress. Orientation-Oriented X3. Build & Nutrition-Well nourished and Well developed.  Head and Neck Head-normocephalic, atraumatic . Neck Global Assessment - supple, no bruit auscultated on the right, no bruit auscultated on the left.  Eye Vision-Wears corrective lenses. Pupil -  Bilateral-Regular and Round. Motion - Bilateral-EOMI.  ENMT Note: lower denture plate   Chest and Lung Exam Auscultation Breath sounds - clear at anterior chest wall and clear at posterior chest wall. Adventitious sounds - No Adventitious sounds.  Cardiovascular Auscultation Rhythm - Regular rate and rhythm. Heart Sounds - S1 WNL and S2 WNL. Murmurs & Other Heart Sounds - Auscultation of the heart reveals - No Murmurs.  Abdomen Palpation/Percussion Tenderness - Abdomen is non-tender to palpation. Rigidity (guarding) - Abdomen is soft. Auscultation Auscultation of the abdomen reveals - Bowel sounds normal.  Female Genitourinary Note: Not done, not pertinent to present illness   Musculoskeletal Note: She is a well-developed female, alert and oriented, in no apparent distress. Evaluation of her hips show normal range of motion with no discomfort. Her left knee shows no effusion. Her range of motion in the left knee is 0 to 135 with no tenderness or instability. Her right knee shows no effusion. She does have a valgus deformity of the right knee of about 10 degrees. She has range of motion of about 5 to 125. There is marked crepitus on range of motion. She is tender lateral greater than medial with no instability. She does have a significant antalgic gait pattern and is very unsteady gait.  IMAGING Radiographs taken today, AP and lateral of the right knee show bone on bone arthritis in the lateral and patellofemoral compartments with significant valgus deformity.  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  PCP: [facility]  IV TXA  Anesthesia Issues: None  Patient was instructed on what medications to stop prior to surgery.  Signed electronically by Joelene Millin, III PA-C

## 2016-12-07 NOTE — Anesthesia Preprocedure Evaluation (Addendum)
Anesthesia Evaluation  Patient identified by MRN, date of birth, ID band Patient awake    Reviewed: Allergy & Precautions, H&P , NPO status , Patient's Chart, lab work & pertinent test results  Airway Mallampati: III  TM Distance: >3 FB Neck ROM: Full    Dental no notable dental hx. (+) Dental Advisory Given, Partial Lower   Pulmonary neg pulmonary ROS,    Pulmonary exam normal breath sounds clear to auscultation       Cardiovascular negative cardio ROS   Rhythm:Regular Rate:Normal     Neuro/Psych negative neurological ROS  negative psych ROS   GI/Hepatic Neg liver ROS, GERD  Medicated and Controlled,  Endo/Other  negative endocrine ROS  Renal/GU negative Renal ROS  negative genitourinary   Musculoskeletal  (+) Arthritis , Osteoarthritis,    Abdominal   Peds  Hematology negative hematology ROS (+)   Anesthesia Other Findings   Reproductive/Obstetrics negative OB ROS                            Anesthesia Physical Anesthesia Plan  ASA: II  Anesthesia Plan: Spinal   Post-op Pain Management:  Regional for Post-op pain   Induction: Intravenous  Airway Management Planned: Simple Face Mask  Additional Equipment:   Intra-op Plan:   Post-operative Plan:   Informed Consent: I have reviewed the patients History and Physical, chart, labs and discussed the procedure including the risks, benefits and alternatives for the proposed anesthesia with the patient or authorized representative who has indicated his/her understanding and acceptance.   Dental advisory given  Plan Discussed with: CRNA  Anesthesia Plan Comments:         Anesthesia Quick Evaluation

## 2016-12-07 NOTE — Progress Notes (Signed)
AssistedDr. Edmond Fitzgerald with right, ultrasound guided, adductor canal block. Side rails up, monitors on throughout procedure. See vital signs in flow sheet. Tolerated Procedure well.  

## 2016-12-07 NOTE — Interval H&P Note (Signed)
History and Physical Interval Note:  12/07/2016 12:25 PM  Courtney Avila  has presented today for surgery, with the diagnosis of Osteoarthritis Right Knee  The various methods of treatment have been discussed with the patient and family. After consideration of risks, benefits and other options for treatment, the patient has consented to  Procedure(s): RIGHT TOTAL KNEE ARTHROPLASTY (Right) as a surgical intervention .  The patient's history has been reviewed, patient examined, no change in status, stable for surgery.  I have reviewed the patient's chart and labs.  Questions were answered to the patient's satisfaction.     Gearlean Alf

## 2016-12-07 NOTE — Anesthesia Procedure Notes (Signed)
Anesthesia Regional Block: Adductor canal block   Pre-Anesthetic Checklist: ,, timeout performed, Correct Patient, Correct Site, Correct Laterality, Correct Procedure, Correct Position, site marked, Risks and benefits discussed, pre-op evaluation,  At surgeon's request and post-op pain management  Laterality: Right  Prep: Maximum Sterile Barrier Precautions used, chloraprep       Needles:  Injection technique: Single-shot  Needle Type: Echogenic Stimulator Needle     Needle Length: 9cm  Needle Gauge: 21     Additional Needles:   Procedures: ultrasound guided,,,,,,,,  Narrative:  Start time: 12/07/2016 12:43 PM End time: 12/07/2016 12:53 PM Injection made incrementally with aspirations every 5 mL. Anesthesiologist: Roderic Palau  Additional Notes: 2% Lidocaine skin wheel.

## 2016-12-07 NOTE — Anesthesia Postprocedure Evaluation (Signed)
Anesthesia Post Note  Patient: Courtney Avila  Procedure(s) Performed: Procedure(s) (LRB): RIGHT TOTAL KNEE ARTHROPLASTY (Right)     Patient location during evaluation: PACU Anesthesia Type: Spinal and Regional Level of consciousness: awake and alert Pain management: pain level controlled Vital Signs Assessment: post-procedure vital signs reviewed and stable Respiratory status: spontaneous breathing and respiratory function stable Cardiovascular status: blood pressure returned to baseline and stable Postop Assessment: spinal receding Anesthetic complications: no    Last Vitals:  Vitals:   12/07/16 1545 12/07/16 1600  BP: 137/69 (!) 145/65  Pulse: 62 64  Resp: 15 14  Temp:  36.5 C    Last Pain:  Vitals:   12/07/16 1530  TempSrc:   PainSc: Asleep                 Trashaun Streight,W. EDMOND

## 2016-12-07 NOTE — Anesthesia Procedure Notes (Addendum)
Spinal  Patient location during procedure: OR Start time: 12/07/2016 1:41 PM End time: 12/07/2016 1:48 PM Staffing Anesthesiologist: Roderic Palau Performed: anesthesiologist  Preanesthetic Checklist Completed: patient identified, surgical consent, pre-op evaluation, timeout performed, IV checked, risks and benefits discussed and monitors and equipment checked Spinal Block Patient position: sitting Prep: DuraPrep Patient monitoring: cardiac monitor, continuous pulse ox and blood pressure Approach: midline Location: L3-4 Injection technique: single-shot Needle Needle type: Quincke  Needle gauge: 22 G Needle length: 9 cm Assessment Sensory level: T8 Additional Notes Functioning IV was confirmed and monitors were applied. Sterile prep and drape, including hand hygiene and sterile gloves were used. The patient was positioned and the spine was prepped. The skin was anesthetized with lidocaine.  Free flow of clear CSF was obtained prior to injecting local anesthetic into the CSF.  The spinal needle aspirated freely following injection.  The needle was carefully withdrawn.  The patient tolerated the procedure well.

## 2016-12-08 ENCOUNTER — Encounter (HOSPITAL_COMMUNITY): Payer: Self-pay | Admitting: Orthopedic Surgery

## 2016-12-08 LAB — BASIC METABOLIC PANEL
ANION GAP: 7 (ref 5–15)
BUN: 16 mg/dL (ref 6–20)
CALCIUM: 8.7 mg/dL — AB (ref 8.9–10.3)
CO2: 25 mmol/L (ref 22–32)
Chloride: 107 mmol/L (ref 101–111)
Creatinine, Ser: 0.72 mg/dL (ref 0.44–1.00)
GLUCOSE: 164 mg/dL — AB (ref 65–99)
Potassium: 4.6 mmol/L (ref 3.5–5.1)
Sodium: 139 mmol/L (ref 135–145)

## 2016-12-08 LAB — CBC
HCT: 32.2 % — ABNORMAL LOW (ref 36.0–46.0)
Hemoglobin: 10.7 g/dL — ABNORMAL LOW (ref 12.0–15.0)
MCH: 29.4 pg (ref 26.0–34.0)
MCHC: 33.2 g/dL (ref 30.0–36.0)
MCV: 88.5 fL (ref 78.0–100.0)
PLATELETS: 219 10*3/uL (ref 150–400)
RBC: 3.64 MIL/uL — AB (ref 3.87–5.11)
RDW: 13.7 % (ref 11.5–15.5)
WBC: 10.6 10*3/uL — AB (ref 4.0–10.5)

## 2016-12-08 MED ORDER — METHOCARBAMOL 500 MG PO TABS: 500.0000 mg | ORAL_TABLET | Freq: Four times a day (QID) | ORAL | 0 refills | Status: DC | PRN

## 2016-12-08 MED ORDER — RIVAROXABAN 10 MG PO TABS: 10.0000 mg | ORAL_TABLET | Freq: Every day | ORAL | 0 refills | Status: DC

## 2016-12-08 MED ORDER — OXYCODONE HCL 5 MG PO TABS: 5.0000 mg | ORAL_TABLET | ORAL | 0 refills | Status: DC | PRN

## 2016-12-08 NOTE — Progress Notes (Signed)
Discharge planning, no HH needs identified. Plan for virtual PT, no DME needs.  [phone]

## 2016-12-08 NOTE — Discharge Instructions (Addendum)
°  ° °Dr. Frank Aluisio °Total Joint Specialist °[facility] °[address] °[place], [place] [address] °[phone] ° °TOTAL KNEE REPLACEMENT POSTOPERATIVE DIRECTIONS ° °Knee Rehabilitation, Guidelines Following Surgery  °Results after knee surgery are often greatly improved when you follow the exercise, range of motion and muscle strengthening exercises prescribed by your doctor. Safety measures are also important to protect the knee from further injury. Any time any of these exercises cause you to have increased pain or swelling in your knee joint, decrease the amount until you are comfortable again and slowly increase them. If you have problems or questions, call your caregiver or physical therapist for advice.  ° °HOME CARE INSTRUCTIONS  °Remove items at home which could result in a fall. This includes throw rugs or furniture in walking pathways.  °· ICE to the affected knee every three hours for 30 minutes at a time and then as needed for pain and swelling.  Continue to use ice on the knee for pain and swelling from surgery. You may notice swelling that will progress down to the foot and ankle.  This is normal after surgery.  Elevate the leg when you are not up walking on it.   °· Continue to use the breathing machine which will help keep your temperature down.  It is common for your temperature to cycle up and down following surgery, especially at night when you are not up moving around and exerting yourself.  The breathing machine keeps your lungs expanded and your temperature down. °· Do not place pillow under knee, focus on keeping the knee straight while resting ° °DIET °You may resume your previous home diet once your are discharged from the hospital. ° °DRESSING / WOUND CARE / SHOWERING °You may shower 3 days after surgery, but keep the wounds dry during showering.  You may use an occlusive plastic wrap (Press'n Seal for example), NO SOAKING/SUBMERGING IN THE BATHTUB.  If the  bandage gets wet, change with a clean dry gauze.  If the incision gets wet, pat the wound dry with a clean towel. °You may start showering once you are discharged home but do not submerge the incision under water. Just pat the incision dry and apply a dry gauze dressing on daily. °Change the surgical dressing daily and reapply a dry dressing each time. ° °ACTIVITY °Walk with your walker as instructed. °Use walker as long as suggested by your caregivers. °Avoid periods of inactivity such as sitting longer than an hour when not asleep. This helps prevent blood clots.  °You may resume a sexual relationship in one month or when given the OK by your doctor.  °You may return to work once you are cleared by your doctor.  °Do not drive a car for 6 weeks or until released by you surgeon.  °Do not drive while taking narcotics. ° °WEIGHT BEARING °Weight bearing as tolerated with assist device (walker, cane, etc) as directed, use it as long as suggested by your surgeon or therapist, typically at least 4-6 weeks. ° °POSTOPERATIVE CONSTIPATION PROTOCOL °Constipation - defined medically as fewer than three stools per week and severe constipation as less than one stool per week. ° °One of the most common issues patients have following surgery is constipation.  Even if you have a regular bowel pattern at home, your normal regimen is likely to be disrupted due to multiple reasons following surgery.  Combination of anesthesia, postoperative narcotics, change in appetite and fluid intake all can affect your   bowels.  In order to avoid complications following surgery, here are some recommendations in order to help you during your recovery period. ° °Colace (docusate) - Pick up an over-the-counter form of Colace or another stool softener and take twice a day as long as you are requiring postoperative pain medications.  Take with a full glass of water daily.  If you experience loose stools or diarrhea, hold the colace until you stool forms  back up.  If your symptoms do not get better within 1 week or if they get worse, check with your doctor. ° °Dulcolax (bisacodyl) - Pick up over-the-counter and take as directed by the product packaging as needed to assist with the movement of your bowels.  Take with a full glass of water.  Use this product as needed if not relieved by Colace only.  ° °MiraLax (polyethylene glycol) - Pick up over-the-counter to have on hand.  MiraLax is a solution that will increase the amount of water in your bowels to assist with bowel movements.  Take as directed and can mix with a glass of water, juice, soda, coffee, or tea.  Take if you go more than two days without a movement. °Do not use MiraLax more than once per day. Call your doctor if you are still constipated or irregular after using this medication for 7 days in a row. ° °If you continue to have problems with postoperative constipation, please contact the office for further assistance and recommendations.  If you experience "the worst abdominal pain ever" or develop nausea or vomiting, please contact the office immediatly for further recommendations for treatment. ° °ITCHING ° If you experience itching with your medications, try taking only a single pain pill, or even half a pain pill at a time.  You can also use Benadryl over the counter for itching or also to help with sleep.  ° °TED HOSE STOCKINGS °Wear the elastic stockings on both legs for three weeks following surgery during the day but you may remove then at night for sleeping. ° °MEDICATIONS °See your medication summary on the “After Visit Summary” that the nursing staff will review with you prior to discharge.  You may have some home medications which will be placed on hold until you complete the course of blood thinner medication.  It is important for you to complete the blood thinner medication as prescribed by your surgeon.  Continue your approved medications as instructed at time of  discharge. ° °PRECAUTIONS °If you experience chest pain or shortness of breath - call 911 immediately for transfer to the hospital emergency department.  °If you develop a fever greater that 101 F, purulent drainage from wound, increased redness or drainage from wound, foul odor from the wound/dressing, or calf pain - CONTACT YOUR SURGEON.   °                                                °FOLLOW-UP APPOINTMENTS °Make sure you keep all of your appointments after your operation with your surgeon and caregivers. You should call the office at the above phone number and make an appointment for approximately two weeks after the date of your surgery or on the date instructed by your surgeon outlined in the "After Visit Summary". ° ° °RANGE OF MOTION AND STRENGTHENING EXERCISES  °Rehabilitation of the knee is important following a knee   injury or an operation. After just a few days of immobilization, the muscles of the thigh which control the knee become weakened and shrink (atrophy). Knee exercises are designed to build up the tone and strength of the thigh muscles and to improve knee motion. Often times heat used for twenty to thirty minutes before working out will loosen up your tissues and help with improving the range of motion but do not use heat for the first two weeks following surgery. These exercises can be done on a training (exercise) mat, on the floor, on a table or on a bed. Use what ever works the best and is most comfortable for you Knee exercises include:  °Leg Lifts - While your knee is still immobilized in a splint or cast, you can do straight leg raises. Lift the leg to 60 degrees, hold for 3 sec, and slowly lower the leg. Repeat 10-20 times 2-3 times daily. Perform this exercise against resistance later as your knee gets better.  °Quad and Hamstring Sets - Tighten up the muscle on the front of the thigh (Quad) and hold for 5-10 sec. Repeat this 10-20 times hourly. Hamstring sets are done by pushing the  foot backward against an object and holding for 5-10 sec. Repeat as with quad sets.  °· Leg Slides: Lying on your back, slowly slide your foot toward your buttocks, bending your knee up off the floor (only go as far as is comfortable). Then slowly slide your foot back down until your leg is flat on the floor again. °· Angel Wings: Lying on your back spread your legs to the side as far apart as you can without causing discomfort.  °A rehabilitation program following serious knee injuries can speed recovery and prevent re-injury in the future due to weakened muscles. Contact your doctor or a physical therapist for more information on knee rehabilitation.  ° °IF YOU ARE TRANSFERRED TO A SKILLED REHAB FACILITY °If the patient is transferred to a skilled rehab facility following release from the hospital, a list of the current medications will be sent to the facility for the patient to continue.  When discharged from the skilled rehab facility, please have the facility set up the patient's Home Health Physical Therapy prior to being released. Also, the skilled facility will be responsible for providing the patient with their medications at time of release from the facility to include their pain medication, the muscle relaxants, and their blood thinner medication. If the patient is still at the rehab facility at time of the two week follow up appointment, the skilled rehab facility will also need to assist the patient in arranging follow up appointment in our office and any transportation needs. ° °MAKE SURE YOU:  °Understand these instructions.  °Get help right away if you are not doing well or get worse.  ° ° °Pick up stool softner and laxative for home use following surgery while on pain medications. °Do not submerge incision under water. °Please use good hand washing techniques while changing dressing each day. °May shower starting three days after surgery. °Please use a clean towel to pat the incision dry following  showers. °Continue to use ice for pain and swelling after surgery. °Do not use any lotions or creams on the incision until instructed by your surgeon. ° °Take Xarelto for two and a half more weeks following discharge from the hospital, then discontinue Xarelto. °Once the patient has completed the blood thinner regimen, then take a Baby 81 mg Aspirin daily   for three more weeks. ° ° °Information on my medicine - XARELTO® (Rivaroxaban) ° ° °Why was Xarelto® prescribed for you? °Xarelto® was prescribed for you to reduce the risk of blood clots forming after orthopedic surgery. The medical term for these abnormal blood clots is venous thromboembolism (VTE). ° °What do you need to know about xarelto® ? °Take your Xarelto® ONCE DAILY at the same time every day. °You may take it either with or without food. ° °If you have difficulty swallowing the tablet whole, you may crush it and mix in applesauce just prior to taking your dose. ° °Take Xarelto® exactly as prescribed by your doctor and DO NOT stop taking Xarelto® without talking to the doctor who prescribed the medication.  Stopping without other VTE prevention medication to take the place of Xarelto® may increase your risk of developing a clot. ° °After discharge, you should have regular check-up appointments with your healthcare provider that is prescribing your Xarelto®.   ° °What do you do if you miss a dose? °If you miss a dose, take it as soon as you remember on the same day then continue your regularly scheduled once daily regimen the next day. Do not take two doses of Xarelto® on the same day.  ° °Important Safety Information °A possible side effect of Xarelto® is bleeding. You should call your healthcare provider right away if you experience any of the following: °? Bleeding from an injury or your nose that does not stop. °? Unusual colored urine (red or dark brown) or unusual colored stools (red or black). °? Unusual bruising for unknown reasons. °? A serious  fall or if you hit your head (even if there is no bleeding). ° °Some medicines may interact with Xarelto® and might increase your risk of bleeding while on Xarelto®. To help avoid this, consult your healthcare provider or pharmacist prior to using any new prescription or non-prescription medications, including herbals, vitamins, non-steroidal anti-inflammatory drugs (NSAIDs) and supplements. ° °This website has more information on Xarelto®: www.xarelto.com. ° ° °

## 2016-12-08 NOTE — Progress Notes (Signed)
   Subjective: 1 Day Post-Op Procedure(s) (LRB): RIGHT TOTAL KNEE ARTHROPLASTY (Right) Patient reports pain as mild.   Patient seen in rounds for Dr. Wynelle Link on morning rounds. Patient is well, but has had some minor complaints of pain in the knee, requiring pain medications Started therapy today. She walked twice today with therapy, 160 feet and then 200 feet. Plan is to go Home after hospital stay.  Objective: Vital signs in last 24 hours: Temp:  [97.5 F (36.4 C)-98.8 F (37.1 C)] 97.7 F (36.5 C) (06/05 1400) Pulse Rate:  [62-83] 76 (06/05 1400) Resp:  [14-18] 18 (06/05 1400) BP: (101-130)/(50-73) 119/73 (06/05 1400) SpO2:  [96 %-100 %] 100 % (06/05 1400)  Intake/Output from previous day:  Intake/Output Summary (Last 24 hours) at 12/08/16 1854 Last data filed at 12/08/16 1800  Gross per 24 hour  Intake          2439.75 ml  Output             1155 ml  Net          1284.75 ml    Intake/Output this shift: Total I/O In: 937.3 [P.O.:480; I.V.:457.3] Out: 200 [Urine:200]  Labs:  Recent Labs  12/08/16 0411  HGB 10.7*    Recent Labs  12/08/16 0411  WBC 10.6*  RBC 3.64*  HCT 32.2*  PLT 219    Recent Labs  12/08/16 0411  NA 139  K 4.6  CL 107  CO2 25  BUN 16  CREATININE 0.72  GLUCOSE 164*  CALCIUM 8.7*   No results for input(s): LABPT, INR in the last 72 hours.  EXAM General - Patient is Alert, Appropriate and Oriented Extremity - Neurovascular intact Sensation intact distally Intact pulses distally Dorsiflexion/Plantar flexion intact Dressing - dressing C/D/I Motor Function - intact, moving foot and toes well on exam.  Hemovac pulled without difficulty.  Past Medical History:  Diagnosis Date  . Allergy   . Anxiety   . Arthritis   . GERD (gastroesophageal reflux disease)     Assessment/Plan: 1 Day Post-Op Procedure(s) (LRB): RIGHT TOTAL KNEE ARTHROPLASTY (Right) Principal Problem:   OA (osteoarthritis) of knee  Estimated body mass  index is 32.32 kg/m as calculated from the following:   Height as of this encounter: 4\' 11"  (1.499 m).   Weight as of this encounter: 72.6 kg (160 lb). Advance diet Up with therapy Plan for discharge tomorrow  In [place].  DVT Prophylaxis - Xarelto Weight-Bearing as tolerated to right leg D/C O2 and Pulse OX and try on Room Air  Arlee Muslim, PA-C Orthopaedic Surgery 12/08/2016, 6:54 PM

## 2016-12-08 NOTE — Evaluation (Signed)
Physical Therapy Evaluation Patient Details Name: Courtney Avila MRN: [id number] DOB: Jan 16, 1941 Today's Date: 12/08/2016   History of Present Illness  Pt is a 76 year old female s/p R TKA  Clinical Impression  Pt is s/p R TKA resulting in the deficits listed below (see PT Problem List).  Pt will benefit from skilled PT to increase their independence and safety with mobility to allow discharge to the venue listed below. Pt mobilizing well POD #1 and plans to d/c home with assist from grandson.     Follow Up Recommendations Other (comment) (virtual PT)    Equipment Recommendations  None recommended by PT    Recommendations for Other Services       Precautions / Restrictions Precautions Precautions: Fall;Knee Precaution Comments: able to perform SLR Restrictions Weight Bearing Restrictions: No Other Position/Activity Restrictions: WBAT      Mobility  Bed Mobility Overal bed mobility: Needs Assistance Bed Mobility: Supine to Sit     Supine to sit: Supervision;HOB elevated        Transfers Overall transfer level: Needs assistance Equipment used: Rolling walker (2 wheeled) Transfers: Sit to/from Stand Sit to Stand: Min guard         General transfer comment: verbal cues for UE and LE positioning  Ambulation/Gait Ambulation/Gait assistance: Min guard Ambulation Distance (Feet): 160 Feet Assistive device: Rolling walker (2 wheeled) Gait Pattern/deviations: Step-to pattern;Antalgic;Decreased stance time - right     General Gait Details: verbal cues for sequence, RW positioning, step length  Stairs            Wheelchair Mobility    Modified Rankin (Stroke Patients Only)       Balance                                             Pertinent Vitals/Pain Pain Assessment: No/denies pain    Home Living Family/patient expects to be discharged to:: Private residence Living Arrangements: Children Available Help at Discharge:  Family Type of Home: House Home Access: Level entry     Home Layout: Able to live on main level with bedroom/bathroom Home Equipment: Cane - single point;Walker - 2 wheels      Prior Function Level of Independence: Independent               Hand Dominance        Extremity/Trunk Assessment        Lower Extremity Assessment Lower Extremity Assessment: RLE deficits/detail RLE Deficits / Details: able to perform SLR, AAROM knee flexion approx 65*       Communication   Communication: No difficulties  Cognition Arousal/Alertness: Awake/alert Behavior During Therapy: WFL for tasks assessed/performed Overall Cognitive Status: Within Functional Limits for tasks assessed                                        General Comments      Exercises Total Joint Exercises Ankle Circles/Pumps: AROM;Both;10 reps Quad Sets: AROM;Both;10 reps Short Arc Quad: AROM;Right;10 reps Heel Slides: AAROM;Right;10 reps Hip ABduction/ADduction: AROM;Right;10 reps Straight Leg Raises: AROM;Right;10 reps   Assessment/Plan    PT Assessment Patient needs continued PT services  PT Problem List Decreased knowledge of use of DME;Decreased activity tolerance;Decreased strength;Decreased mobility;Decreased range of motion       PT  Treatment Interventions Functional mobility training;Stair training;Gait training;DME instruction;Therapeutic activities;Therapeutic exercise;Patient/family education    PT Goals (Current goals can be found in the Care Plan section)  Acute Rehab PT Goals PT Goal Formulation: With patient Time For Goal Achievement: 12/11/16 Potential to Achieve Goals: Good    Frequency 7X/week   Barriers to discharge        Co-evaluation               AM-PAC PT "6 Clicks" Daily Activity  Outcome Measure Difficulty turning over in bed (including adjusting bedclothes, sheets and blankets)?: None Difficulty moving from lying on back to sitting on the  side of the bed? : None Difficulty sitting down on and standing up from a chair with arms (e.g., wheelchair, bedside commode, etc,.)?: A Little Help needed moving to and from a bed to chair (including a wheelchair)?: A Little Help needed walking in hospital room?: A Little Help needed climbing 3-5 steps with a railing? : A Little 6 Click Score: 20    End of Session Equipment Utilized During Treatment: Gait belt Activity Tolerance: Patient tolerated treatment well Patient left: in chair;with call bell/phone within reach   PT Visit Diagnosis: Other abnormalities of gait and mobility (R26.89) Pain - Right/Left: Right Pain - part of body: Knee    Time: 7897-8478 PT Time Calculation (min) (ACUTE ONLY): 15 min   Charges:   PT Evaluation $PT Eval Low Complexity: 1 Procedure     PT G CodesCarmelia Bake, PT, DPT 12/08/2016 Pager: [phone]   York Ram E 12/08/2016, 10:04 AM

## 2016-12-08 NOTE — Discharge Summary (Signed)
Physician Discharge Summary   Patient ID: Courtney Avila MRN: [id number] DOB/AGE: August 14, 1940 76 y.o.  Admit date: 12/07/2016 Discharge date: 12/09/16  Primary Diagnosis:  Osteoarthritis  Right knee(s) Admission Diagnoses:  Past Medical History:  Diagnosis Date  . Allergy   . Anxiety   . Arthritis   . GERD (gastroesophageal reflux disease)    Discharge Diagnoses:   Principal Problem:   OA (osteoarthritis) of knee  Estimated body mass index is 32.32 kg/m as calculated from the following:   Height as of this encounter: '4\' 11"'  (1.499 m).   Weight as of this encounter: 72.6 kg (160 lb).  Procedure:  Procedure(s) (LRB): RIGHT TOTAL KNEE ARTHROPLASTY (Right)   Consults: None  HPI: [name] is a 76 y.o. year old female with end stage OA of her right knee with progressively worsening pain and dysfunction. She has constant pain, with activity and at rest and significant functional deficits with difficulties even with ADLs. She has had extensive non-op management including analgesics, injections of cortisone and viscosupplements, and home exercise program, but remains in significant pain with significant dysfunction.Radiographs show bone on bone arthritis lateral and patellofemoral. She presents now for right Total Knee Arthroplasty.    Laboratory Data: Admission on 12/07/2016  Component Date Value Ref Range Status  . WBC 12/08/2016 10.6* 4.0 - 10.5 K/uL Final  . RBC 12/08/2016 3.64* 3.87 - 5.11 MIL/uL Final  . Hemoglobin 12/08/2016 10.7* 12.0 - 15.0 g/dL Final  . HCT 12/08/2016 32.2* 36.0 - 46.0 % Final  . MCV 12/08/2016 88.5  78.0 - 100.0 fL Final  . MCH 12/08/2016 29.4  26.0 - 34.0 pg Final  . MCHC 12/08/2016 33.2  30.0 - 36.0 g/dL Final  . RDW 12/08/2016 13.7  11.5 - 15.5 % Final  . Platelets 12/08/2016 219  150 - 400 K/uL Final  . Sodium 12/08/2016 139  135 - 145 mmol/L Final  . Potassium 12/08/2016 4.6  3.5 - 5.1 mmol/L Final  . Chloride 12/08/2016 107  101 - 111 mmol/L  Final  . CO2 12/08/2016 25  22 - 32 mmol/L Final  . Glucose, Bld 12/08/2016 164* 65 - 99 mg/dL Final  . BUN 12/08/2016 16  6 - 20 mg/dL Final  . Creatinine, Ser 12/08/2016 0.72  0.44 - 1.00 mg/dL Final  . Calcium 12/08/2016 8.7* 8.9 - 10.3 mg/dL Final  . GFR calc non Af Amer 12/08/2016 >60  >60 mL/min Final  . GFR calc Af Amer 12/08/2016 >60  >60 mL/min Final   Comment: (NOTE) The eGFR has been calculated using the CKD EPI equation. This calculation has not been validated in all clinical situations. eGFR's persistently <60 mL/min signify possible Chronic Kidney Disease.   Georgiann Hahn gap 12/08/2016 7  5 - 15 Final  Hospital Outpatient Visit on 12/01/2016  Component Date Value Ref Range Status  . aPTT 12/01/2016 35  24 - 36 seconds Final  . WBC 12/01/2016 7.1  4.0 - 10.5 K/uL Final  . RBC 12/01/2016 4.25  3.87 - 5.11 MIL/uL Final  . Hemoglobin 12/01/2016 12.2  12.0 - 15.0 g/dL Final  . HCT 12/01/2016 38.1  36.0 - 46.0 % Final  . MCV 12/01/2016 89.6  78.0 - 100.0 fL Final  . MCH 12/01/2016 28.7  26.0 - 34.0 pg Final  . MCHC 12/01/2016 32.0  30.0 - 36.0 g/dL Final  . RDW 12/01/2016 13.6  11.5 - 15.5 % Final  . Platelets 12/01/2016 244  150 - 400 K/uL Final  .  Sodium 12/01/2016 139  135 - 145 mmol/L Final  . Potassium 12/01/2016 4.2  3.5 - 5.1 mmol/L Final  . Chloride 12/01/2016 106  101 - 111 mmol/L Final  . CO2 12/01/2016 26  22 - 32 mmol/L Final  . Glucose, Bld 12/01/2016 137* 65 - 99 mg/dL Final  . BUN 12/01/2016 26* 6 - 20 mg/dL Final  . Creatinine, Ser 12/01/2016 1.00  0.44 - 1.00 mg/dL Final  . Calcium 12/01/2016 9.2  8.9 - 10.3 mg/dL Final  . Total Protein 12/01/2016 7.1  6.5 - 8.1 g/dL Final  . Albumin 12/01/2016 3.8  3.5 - 5.0 g/dL Final  . AST 12/01/2016 25  15 - 41 U/L Final  . ALT 12/01/2016 15  14 - 54 U/L Final  . Alkaline Phosphatase 12/01/2016 59  38 - 126 U/L Final  . Total Bilirubin 12/01/2016 0.5  0.3 - 1.2 mg/dL Final  . GFR calc non Af Amer 12/01/2016 53* >60  mL/min Final  . GFR calc Af Amer 12/01/2016 >60  >60 mL/min Final   Comment: (NOTE) The eGFR has been calculated using the CKD EPI equation. This calculation has not been validated in all clinical situations. eGFR's persistently <60 mL/min signify possible Chronic Kidney Disease.   . Anion gap 12/01/2016 7  5 - 15 Final  . Prothrombin Time 12/01/2016 13.5  11.4 - 15.2 seconds Final  . INR 12/01/2016 1.02   Final  . ABO/RH(D) 12/01/2016 O NEG   Final  . Antibody Screen 12/01/2016 NEG   Final  . Sample Expiration 12/01/2016 12/10/2016   Final  . Extend sample reason 12/01/2016 NO TRANSFUSIONS OR PREGNANCY IN THE PAST 3 MONTHS   Final  . MRSA, PCR 12/01/2016 NEGATIVE  NEGATIVE Final  . Staphylococcus aureus 12/01/2016 NEGATIVE  NEGATIVE Final   Comment:        The Xpert SA Assay (FDA approved for NASAL specimens in patients over 72 years of age), is one component of a comprehensive surveillance program.  Test performance has been validated by [facility] for patients greater than or equal to 57 year old. It is not intended to diagnose infection nor to guide or monitor treatment.   . ABO/RH(D) 12/01/2016 O NEG   Final  Office Visit on 11/02/2016  Component Date Value Ref Range Status  . WBC 11/02/2016 7.8  3.4 - 10.8 x10E3/uL Final  . RBC 11/02/2016 4.12  3.77 - 5.28 x10E6/uL Final  . Hemoglobin 11/02/2016 12.0  11.1 - 15.9 g/dL Final  . Hematocrit 11/02/2016 36.1  34.0 - 46.6 % Final  . MCV 11/02/2016 88  79 - 97 fL Final  . MCH 11/02/2016 29.1  26.6 - 33.0 pg Final  . MCHC 11/02/2016 33.2  31.5 - 35.7 g/dL Final  . RDW 11/02/2016 14.4  12.3 - 15.4 % Final  . Platelets 11/02/2016 217  150 - 379 x10E3/uL Final  . Neutrophils 11/02/2016 59  Not Estab. % Final  . Lymphs 11/02/2016 30  Not Estab. % Final  . Monocytes 11/02/2016 7  Not Estab. % Final  . Eos 11/02/2016 3  Not Estab. % Final  . Basos 11/02/2016 1  Not Estab. % Final  . Neutrophils Absolute 11/02/2016 4.6  1.4 -  7.0 x10E3/uL Final  . Lymphocytes Absolute 11/02/2016 2.3  0.7 - 3.1 x10E3/uL Final  . Monocytes Absolute 11/02/2016 0.6  0.1 - 0.9 x10E3/uL Final  . EOS (ABSOLUTE) 11/02/2016 0.2  0.0 - 0.4 x10E3/uL Final  . Basophils Absolute 11/02/2016 0.0  0.0 - 0.2 x10E3/uL Final  . Immature Granulocytes 11/02/2016 0  Not Estab. % Final  . Immature Grans (Abs) 11/02/2016 0.0  0.0 - 0.1 x10E3/uL Final  . Glucose 11/02/2016 148* 65 - 99 mg/dL Final  . BUN 11/02/2016 18  8 - 27 mg/dL Final  . Creatinine, Ser 11/02/2016 0.61  0.57 - 1.00 mg/dL Final  . GFR calc non Af Amer 11/02/2016 88  >59 mL/min/1.73 Final  . GFR calc Af Amer 11/02/2016 101  >59 mL/min/1.73 Final  . BUN/Creatinine Ratio 11/02/2016 30* 12 - 28 Final  . Sodium 11/02/2016 143  134 - 144 mmol/L Final  . Potassium 11/02/2016 3.8  3.5 - 5.2 mmol/L Final  . Chloride 11/02/2016 103  96 - 106 mmol/L Final  . CO2 11/02/2016 27  18 - 29 mmol/L Final  . Calcium 11/02/2016 9.2  8.7 - 10.3 mg/dL Final  . Total Protein 11/02/2016 6.6  6.0 - 8.5 g/dL Final  . Albumin 11/02/2016 4.0  3.5 - 4.8 g/dL Final  . Globulin, Total 11/02/2016 2.6  1.5 - 4.5 g/dL Final  . Albumin/Globulin Ratio 11/02/2016 1.5  1.2 - 2.2 Final  . Bilirubin Total 11/02/2016 <0.2  0.0 - 1.2 mg/dL Final  . Alkaline Phosphatase 11/02/2016 70  39 - 117 IU/L Final  . AST 11/02/2016 20  0 - 40 IU/L Final  . ALT 11/02/2016 10  0 - 32 IU/L Final     X-Rays:No results found.  EKG: Orders placed or performed in visit on 11/02/16  . EKG 12-Lead     Hospital Course: Courtney Avila is a 76 y.o. who was admitted to [facility]. They were brought to the operating room on 12/07/2016 and underwent Procedure(s): RIGHT TOTAL KNEE ARTHROPLASTY.  Patient tolerated the procedure well and was later transferred to the recovery room and then to the orthopaedic floor for postoperative care.  They were given PO and IV analgesics for pain control following their surgery.  They were given 24  hours of postoperative antibiotics of  Anti-infectives    Start     Dose/Rate Route Frequency Ordered Stop   12/07/16 2000  ceFAZolin (ANCEF) IVPB 2g/100 mL premix     2 g 200 mL/hr over 30 Minutes Intravenous Every 6 hours 12/07/16 1636 12/08/16 0237   12/07/16 1202  ceFAZolin (ANCEF) 2-4 GM/100ML-% IVPB    Comments:  Mardelle Matte   : cabinet override      12/07/16 1202 12/07/16 1347   12/07/16 1151  ceFAZolin (ANCEF) IVPB 2g/100 mL premix     2 g 200 mL/hr over 30 Minutes Intravenous On call to O.R. 12/07/16 1151 12/07/16 1347     and started on DVT prophylaxis in the form of Xarelto.   PT and OT were ordered for total joint protocol.  Discharge planning consulted to help with postop disposition and equipment needs.  Patient had a decent night on the evening of surgery.  They started to get up OOB with therapy on day one and walked twice, 160 feet and 200 feet. Hemovac drain was pulled without difficulty.  Continued to work with therapy into day two.  Dressing was changed on day two and the incision was healing well.   Patient was seen in rounds and was ready to go home.   Diet - Regular diet Follow up - in 2 weeks Activity - WBAT Disposition - Home Condition Upon Discharge - Stable D/C Meds - See DC Summary DVT Prophylaxis - Xarelto  Discharge  Instructions    Call MD / Call 911    Complete by:  As directed    If you experience chest pain or shortness of breath, CALL 911 and be transported to the hospital emergency room.  If you develope a fever above 101 F, pus (white drainage) or increased drainage or redness at the wound, or calf pain, call your surgeon's office.   Change dressing    Complete by:  As directed    Change dressing daily with sterile 4 x 4 inch gauze dressing and apply TED hose. Do not submerge the incision under water.   Constipation Prevention    Complete by:  As directed    Drink plenty of fluids.  Prune juice may be helpful.  You may use a stool softener,  such as Colace (over the counter) 100 mg twice a day.  Use MiraLax (over the counter) for constipation as needed.   Diet - low sodium heart healthy    Complete by:  As directed    Discharge instructions    Complete by:  As directed    Take Xarelto for two and a half more weeks, then discontinue Xarelto. Once the patient has completed the blood thinner regimen, then take a Baby 81 mg Aspirin daily for three more weeks.   Pick up stool softner and laxative for home use following surgery while on pain medications. Do not submerge incision under water. Please use good hand washing techniques while changing dressing each day. May shower starting three days after surgery. Please use a clean towel to pat the incision dry following showers. Continue to use ice for pain and swelling after surgery. Do not use any lotions or creams on the incision until instructed by your surgeon.  Wear both TED hose on both legs during the day every day for three weeks, but may remove the TED hose at night at home.  Postoperative Constipation Protocol  Constipation - defined medically as fewer than three stools per week and severe constipation as less than one stool per week.  One of the most common issues patients have following surgery is constipation.  Even if you have a regular bowel pattern at home, your normal regimen is likely to be disrupted due to multiple reasons following surgery.  Combination of anesthesia, postoperative narcotics, change in appetite and fluid intake all can affect your bowels.  In order to avoid complications following surgery, here are some recommendations in order to help you during your recovery period.  Colace (docusate) - Pick up an over-the-counter form of Colace or another stool softener and take twice a day as long as you are requiring postoperative pain medications.  Take with a full glass of water daily.  If you experience loose stools or diarrhea, hold the colace until you  stool forms back up.  If your symptoms do not get better within 1 week or if they get worse, check with your doctor.  Dulcolax (bisacodyl) - Pick up over-the-counter and take as directed by the product packaging as needed to assist with the movement of your bowels.  Take with a full glass of water.  Use this product as needed if not relieved by Colace only.   MiraLax (polyethylene glycol) - Pick up over-the-counter to have on hand.  MiraLax is a solution that will increase the amount of water in your bowels to assist with bowel movements.  Take as directed and can mix with a glass of water, juice, soda, coffee, or tea.  Take if you go more than two days without a movement. Do not use MiraLax more than once per day. Call your doctor if you are still constipated or irregular after using this medication for 7 days in a row.  If you continue to have problems with postoperative constipation, please contact the office for further assistance and recommendations.  If you experience "the worst abdominal pain ever" or develop nausea or vomiting, please contact the office immediatly for further recommendations for treatment.   Do not put a pillow under the knee. Place it under the heel.    Complete by:  As directed    Do not sit on low chairs, stoools or toilet seats, as it may be difficult to get up from low surfaces    Complete by:  As directed    Driving restrictions    Complete by:  As directed    No driving until released by the physician.   Increase activity slowly as tolerated    Complete by:  As directed    Lifting restrictions    Complete by:  As directed    No lifting until released by the physician.   Patient may shower    Complete by:  As directed    You may shower without a dressing once there is no drainage.  Do not wash over the wound.  If drainage remains, do not shower until drainage stops.   TED hose    Complete by:  As directed    Use stockings (TED hose) for 3 weeks on both leg(s).   You may remove them at night for sleeping.   Weight bearing as tolerated    Complete by:  As directed    Laterality:  right   Extremity:  Lower     Allergies as of 12/08/2016   No Known Allergies     Medication List    STOP taking these medications   HAIR/SKIN/NAILS/BIOTIN Tabs   ibuprofen 200 MG tablet Commonly known as:  ADVIL,MOTRIN     TAKE these medications   busPIRone 10 MG tablet Commonly known as:  BUSPAR Take 10 mg by mouth daily as needed (for anxiety).   fluticasone 50 MCG/ACT nasal spray Commonly known as:  FLONASE Place 1 spray into both nostrils daily as needed for allergies or rhinitis.   methocarbamol 500 MG tablet Commonly known as:  ROBAXIN Take 1 tablet (500 mg total) by mouth every 6 (six) hours as needed for muscle spasms.   oxyCODONE 5 MG immediate release tablet Commonly known as:  Oxy IR/ROXICODONE Take 1-2 tablets (5-10 mg total) by mouth every 4 (four) hours as needed for moderate pain or severe pain.   PARoxetine 40 MG tablet Commonly known as:  PAXIL Take 40 mg by mouth daily.   rivaroxaban 10 MG Tabs tablet Commonly known as:  XARELTO Take 1 tablet (10 mg total) by mouth daily with breakfast. Take Xarelto for two and a half more weeks following discharge from the hospital, then discontinue Xarelto. Once the patient has completed the blood thinner regimen, then take a Baby 81 mg Aspirin daily for three more weeks. Start taking on:  12/09/2016   temazepam 30 MG capsule Commonly known as:  RESTORIL Take 30 mg by mouth at bedtime.      Follow-up Information    Gaynelle Arabian, MD. Schedule an appointment as soon as possible for a visit on 12/22/2016.   Specialty:  Orthopedic Surgery Contact information: [address] [place]  [place] [phone]           Signed: Arlee Muslim, PA-C Orthopaedic Surgery 12/08/2016, 7:00 PM

## 2016-12-08 NOTE — Progress Notes (Signed)
   12/08/16 1300  PT Visit Information  Last PT Received On 12/08/16  Assistance Needed +1  History of Present Illness Pt is a 76 year old female s/p R TKA  Subjective Data  Subjective Pt ambulated again in hallway and reports pain well controlled.  Precautions  Precautions Fall;Knee  Precaution Comments able to perform SLR  Restrictions  Other Position/Activity Restrictions WBAT  Pain Assessment  Pain Assessment 0-10  Pain Score 2  Pain Location R "knee cap"  Pain Descriptors / Indicators Sore  Pain Intervention(s) Monitored during session;Limited activity within patient's tolerance;Repositioned;Premedicated before session;Ice applied  Cognition  Arousal/Alertness Awake/alert  Behavior During Therapy WFL for tasks assessed/performed  Overall Cognitive Status Within Functional Limits for tasks assessed  Bed Mobility  General bed mobility comments pt up in recliner  Transfers  Overall transfer level Needs assistance  Equipment used Rolling walker (2 wheeled)  Sit to Stand Min guard  General transfer comment verbal cues for hand placement, LOB with adjusting gown requiring min assist  Ambulation/Gait  Ambulation/Gait assistance Min guard  Ambulation Distance (Feet) 200 Feet  Assistive device Rolling walker (2 wheeled)  Gait Pattern/deviations Step-to pattern;Antalgic;Decreased stance time - right  General Gait Details verbal cues for sequence, RW positioning, step length  PT - End of Session  Activity Tolerance Patient tolerated treatment well  Patient left in chair;with call bell/phone within reach  PT - Assessment/Plan  PT Plan Current plan remains appropriate  PT Visit Diagnosis Other abnormalities of gait and mobility (R26.89)  Pain - Right/Left Right  Pain - part of body Knee  PT Frequency (ACUTE ONLY) 7X/week  Follow Up Recommendations Other (comment) (virtual PT)  PT equipment None recommended by PT  AM-PAC PT "6 Clicks" Daily Activity Outcome Measure  Difficulty  turning over in bed (including adjusting bedclothes, sheets and blankets)? 4  Difficulty moving from lying on back to sitting on the side of the bed?  4  Difficulty sitting down on and standing up from a chair with arms (e.g., wheelchair, bedside commode, etc,.)? 3  Help needed moving to and from a bed to chair (including a wheelchair)? 3  Help needed walking in hospital room? 3  Help needed climbing 3-5 steps with a railing?  3  6 Click Score 20  Mobility G Code  CJ  PT Goal Progression  Progress towards PT goals Progressing toward goals  PT Time Calculation  PT Start Time (ACUTE ONLY) 1303  PT Stop Time (ACUTE ONLY) 1319  PT Time Calculation (min) (ACUTE ONLY) 16 min  PT General Charges  $$ ACUTE PT VISIT 1 Procedure  PT Treatments  $Gait Training 8-22 mins   Carmelia Bake, PT, DPT 12/08/2016 Pager: [phone]

## 2016-12-08 NOTE — Evaluation (Signed)
Occupational Therapy Evaluation Patient Details Name: Courtney Avila MRN: [id number] DOB: 19-Mar-1941 Today's Date: 12/08/2016    History of Present Illness Pt is a 76 year old female s/p R TKA   Clinical Impression   Pt was admitted for the above sx. Will plan to see pt once more prior to admission to review safety and toilet transfers. She needs only min A for adls and will have help at home    Follow Up Recommendations  Supervision/Assistance - 24 hour    Equipment Recommendations  Hospital bed    Recommendations for Other Services       Precautions / Restrictions Precautions Precautions: Fall;Knee Precaution Comments: able to perform SLR Restrictions Other Position/Activity Restrictions: WBAT      Mobility Bed Mobility Overal bed mobility: Needs Assistance Bed Mobility: Supine to Sit     Supine to sit: Supervision;HOB elevated     General bed mobility comments: oob  Transfers Overall transfer level: Needs assistance Equipment used: Rolling walker (2 wheeled) Transfers: Sit to/from Stand Sit to Stand: Min guard         General transfer comment: for safety. Cues for UE placement    Balance                                           ADL either performed or assessed with clinical judgement   ADL Overall ADL's : Needs assistance/impaired     Grooming: Wash/dry hands;Min guard;Standing   Upper Body Bathing: Set up;Sitting   Lower Body Bathing: Minimal assistance;Sit to/from stand   Upper Body Dressing : Set up;Sitting   Lower Body Dressing: Minimal assistance;Sit to/from stand   Toilet Transfer: Min guard;Ambulation;Comfort height toilet;RW   Toileting- Clothing Manipulation and Hygiene: Min guard;Sitting/lateral lean         General ADL Comments: pt moves quickly.   Cues for UE placement during sit to stand.  Educated on tub readiness and options. She will plan to sponge bathe initially  Reviewed safety and knee precautions      Vision         Perception     Praxis      Pertinent Vitals/Pain Pain Assessment: 0-10 Pain Score: 6  Pain Location: R knee  (with weight bearing) Pain Descriptors / Indicators: Sore Pain Intervention(s): Limited activity within patient's tolerance;Monitored during session;Repositioned;Patient requesting pain meds-RN notified     Hand Dominance     Extremity/Trunk Assessment Upper Extremity Assessment Upper Extremity Assessment: Overall WFL for tasks assessed (recent R shoulder replacement)          Communication Communication Communication: No difficulties   Cognition Arousal/Alertness: Awake/alert Behavior During Therapy: WFL for tasks assessed/performed Overall Cognitive Status: Within Functional Limits for tasks assessed                                     General Comments       Exercises    Shoulder Instructions      Home Living Family/patient expects to be discharged to:: Private residence Living Arrangements: Children Available Help at Discharge: Family Type of Home: House Home Access: Level entry     Home Layout: Able to live on main level with bedroom/bathroom     Bathroom Shower/Tub: Teacher, early years/pre: Handicapped height     Home  Equipment: Cane - single point;Walker - 2 wheels;Toilet riser;Shower seat   Additional Comments: granddaughter is staying with pt      Prior Functioning/Environment Level of Independence: Independent                 OT Problem List: Impaired UE functional use;Decreased safety awareness      OT Treatment/Interventions: Self-care/ADL training;DME and/or AE instruction;Patient/family education    OT Goals(Current goals can be found in the care plan section) Acute Rehab OT Goals Patient Stated Goal: home OT Goal Formulation: With patient Time For Goal Achievement: 12/10/16 ADL Goals Pt Will Transfer to Toilet: with supervision;ambulating;bedside commode (toilet  riser) Additional ADL Goal #1: pt will not need any safety cues during session (including hand placement)  OT Frequency: Min 2X/week   Barriers to D/C:            Co-evaluation              AM-PAC PT "6 Clicks" Daily Activity     Outcome Measure Help from another person eating meals?: None Help from another person taking care of personal grooming?: A Little Help from another person toileting, which includes using toliet, bedpan, or urinal?: A Little Help from another person bathing (including washing, rinsing, drying)?: A Little Help from another person to put on and taking off regular upper body clothing?: A Little Help from another person to put on and taking off regular lower body clothing?: A Little 6 Click Score: 19   End of Session Nurse Communication: Patient requests pain meds  Activity Tolerance: Patient tolerated treatment well Patient left: in chair;with call bell/phone within reach  OT Visit Diagnosis: Pain Pain - Right/Left: Right Pain - part of body: Knee                Time: 1120-1141 OT Time Calculation (min): 21 min Charges:  OT General Charges $OT Visit: 1 Procedure OT Evaluation $OT Eval Low Complexity: 1 Procedure G-Codes:     [place], OTR/L [phone] 12/08/2016  Pammie Chirino 12/08/2016, 12:03 PM

## 2016-12-09 LAB — BASIC METABOLIC PANEL
ANION GAP: 4 — AB (ref 5–15)
BUN: 21 mg/dL — AB (ref 6–20)
CALCIUM: 8.5 mg/dL — AB (ref 8.9–10.3)
CO2: 29 mmol/L (ref 22–32)
Chloride: 106 mmol/L (ref 101–111)
Creatinine, Ser: 0.68 mg/dL (ref 0.44–1.00)
GFR calc Af Amer: >60 mL/min (ref >60)
Glucose, Bld: 127 mg/dL — ABNORMAL HIGH (ref 65–99)
POTASSIUM: 4 mmol/L (ref 3.5–5.1)
SODIUM: 139 mmol/L (ref 135–145)

## 2016-12-09 LAB — CBC
HCT: 30.7 % — ABNORMAL LOW (ref 36.0–46.0)
Hemoglobin: 10 g/dL — ABNORMAL LOW (ref 12.0–15.0)
MCH: 29.1 pg (ref 26.0–34.0)
MCHC: 32.6 g/dL (ref 30.0–36.0)
MCV: 89.2 fL (ref 78.0–100.0)
PLATELETS: 239 10*3/uL (ref 150–400)
RBC: 3.44 MIL/uL — AB (ref 3.87–5.11)
RDW: 14 % (ref 11.5–15.5)
WBC: 12.3 10*3/uL — AB (ref 4.0–10.5)

## 2016-12-09 NOTE — Progress Notes (Signed)
Physical Therapy Treatment Patient Details Name: Courtney Avila MRN: [id number] DOB: October 13, 1940 Today's Date: 12/09/2016    History of Present Illness Pt is a 76 year old female s/p R TKA    PT Comments    Pt ambulated in hallway and then performed LE exercises.  Pt progressing well and to d/c home today.  Pt had no further questions/concerns.  Follow Up Recommendations  Other (comment) (virtual PT)     Equipment Recommendations  None recommended by PT    Recommendations for Other Services       Precautions / Restrictions Precautions Precautions: Fall;Knee Precaution Comments: able to perform SLR Restrictions Weight Bearing Restrictions: No Other Position/Activity Restrictions: WBAT    Mobility  Bed Mobility Overal bed mobility: Needs Assistance Bed Mobility: Sit to Supine     Supine to sit: Supervision;HOB elevated Sit to supine: Supervision      Transfers Overall transfer level: Needs assistance Equipment used: Rolling walker (2 wheeled) Transfers: Sit to/from Stand Sit to Stand: Min guard         General transfer comment: verbal cues for hand placement  Ambulation/Gait Ambulation/Gait assistance: Min guard Ambulation Distance (Feet): 200 Feet Assistive device: Rolling walker (2 wheeled) Gait Pattern/deviations: Antalgic;Decreased stance time - right;Step-through pattern     General Gait Details: verbal cues for sequence, RW positioning, step length   Stairs            Wheelchair Mobility    Modified Rankin (Stroke Patients Only)       Balance                                            Cognition Arousal/Alertness: Awake/alert Behavior During Therapy: WFL for tasks assessed/performed Overall Cognitive Status: Within Functional Limits for tasks assessed                                        Exercises Total Joint Exercises Ankle Circles/Pumps: AROM;Both;10 reps Quad Sets: AROM;Both;10 reps Short  Arc Quad: AROM;Right;10 reps Heel Slides: AAROM;Right;10 reps;Seated Hip ABduction/ADduction: AROM;Right;10 reps Straight Leg Raises: AROM;Right;10 reps    General Comments        Pertinent Vitals/Pain Pain Assessment: 0-10 Pain Score: 3  Pain Location: R knee Pain Descriptors / Indicators: Sore Pain Intervention(s): Limited activity within patient's tolerance;Repositioned;Monitored during session    Home Living                      Prior Function            PT Goals (current goals can now be found in the care plan section) Progress towards PT goals: Progressing toward goals    Frequency    7X/week      PT Plan Current plan remains appropriate    Co-evaluation              AM-PAC PT "6 Clicks" Daily Activity  Outcome Measure  Difficulty turning over in bed (including adjusting bedclothes, sheets and blankets)?: None Difficulty moving from lying on back to sitting on the side of the bed? : None Difficulty sitting down on and standing up from a chair with arms (e.g., wheelchair, bedside commode, etc,.)?: A Little Help needed moving to and from a bed to chair (including a wheelchair)?: A Little  Help needed walking in hospital room?: A Little Help needed climbing 3-5 steps with a railing? : A Little 6 Click Score: 20    End of Session   Activity Tolerance: Patient tolerated treatment well Patient left: with call bell/phone within reach;in bed Nurse Communication: Mobility status PT Visit Diagnosis: Other abnormalities of gait and mobility (R26.89)     Time: 8403-7543 PT Time Calculation (min) (ACUTE ONLY): 17 min  Charges:  $Therapeutic Exercise: 8-22 mins                    G Codes:       Carmelia Bake, PT, DPT 12/09/2016 Pager: [phone]   York Ram E 12/09/2016, 12:19 PM

## 2016-12-09 NOTE — Progress Notes (Signed)
Occupational Therapy Treatment Patient Details Name: Courtney Avila MRN: [id number] DOB: [birth year] Today's Date: 12/09/2016    History of present illness Pt is a 76 year old female s/p R TKA   OT comments  Pt needs cues to reinforce hand placement for sit to stand.  Increased safety (slower speed) when ambulating to bathroom this am.  Follow Up Recommendations  Supervision/Assistance - 24 hour    Equipment Recommendations  None recommended by OT    Recommendations for Other Services      Precautions / Restrictions Precautions Precautions: Fall;Knee Restrictions Weight Bearing Restrictions: No       Mobility Bed Mobility         Supine to sit: Supervision;HOB elevated        Transfers   Equipment used: Rolling walker (2 wheeled)   Sit to Stand: Supervision         General transfer comment: cues for UE placement    Balance                                           ADL either performed or assessed with clinical judgement   ADL       Grooming: Supervision/safety;Standing;Wash/dry hands                   Toilet Transfer: Supervision/safety;Ambulation;RW;Comfort height toilet   Toileting- Clothing Manipulation and Hygiene: Supervision/safety;Sit to/from stand         General ADL Comments: cues for hand placement when standing.  Cued to push from commode seat as this is what she will do at home     Vision       Perception     Praxis      Cognition Arousal/Alertness: Awake/alert Behavior During Therapy: WFL for tasks assessed/performed Overall Cognitive Status: Within Functional Limits for tasks assessed                                          Exercises     Shoulder Instructions       General Comments      Pertinent Vitals/ Pain       Pain Assessment: 0-10 Pain Score: 4  Pain Location: R knee Pain Descriptors / Indicators: Sore Pain Intervention(s): Limited activity within patient's  tolerance;Monitored during session;Premedicated before session;Repositioned  Home Living                                          Prior Functioning/Environment              Frequency           Progress Toward Goals  OT Goals(current goals can now be found in the care plan section)  Progress towards OT goals: Progressing toward goals     Plan      Co-evaluation                 AM-PAC PT "6 Clicks" Daily Activity     Outcome Measure   Help from another person eating meals?: None Help from another person taking care of personal grooming?: A Little Help from another person toileting, which includes using toliet, bedpan, or urinal?: A Little Help  from another person bathing (including washing, rinsing, drying)?: A Little Help from another person to put on and taking off regular upper body clothing?: A Little Help from another person to put on and taking off regular lower body clothing?: A Little 6 Click Score: 19    End of Session CPM Right Knee CPM Right Knee: Off  OT Visit Diagnosis: Pain Pain - Right/Left: Right Pain - part of body: Knee   Activity Tolerance Patient tolerated treatment well   Patient Left in chair;with call bell/phone within reach   Nurse Communication          Time: 8590-9311 OT Time Calculation (min): 13 min  Charges: OT General Charges $OT Visit: 1 Procedure OT Treatments $Self Care/Home Management : 8-22 mins  Lesle Chris, OTR/L [phone] 12/09/2016   [place] 12/09/2016, 9:12 AM

## 2016-12-09 NOTE — Progress Notes (Signed)
   Subjective: 2 Days Post-Op Procedure(s) (LRB): RIGHT TOTAL KNEE ARTHROPLASTY (Right) Patient reports pain as mild.   Patient seen in rounds with Dr. Wynelle Link. Patient is well, but has had some minor complaints of pain in the knee, requiring pain medications Patient is ready to go home  Objective: Vital signs in last 24 hours: Temp:  [97.5 F (36.4 C)-98.3 F (36.8 C)] 97.7 F (36.5 C) (06/06 0605) Pulse Rate:  [64-76] 64 (06/06 0605) Resp:  [17-18] 18 (06/06 0605) BP: (99-130)/(63-73) 110/64 (06/06 0605) SpO2:  [96 %-100 %] 98 % (06/06 0605)  Intake/Output from previous day:  Intake/Output Summary (Last 24 hours) at 12/09/16 0749 Last data filed at 12/09/16 0600  Gross per 24 hour  Intake          1487.25 ml  Output              200 ml  Net          1287.25 ml    Intake/Output this shift: No intake/output data recorded.  Labs:  Recent Labs  12/08/16 0411 12/09/16 0435  HGB 10.7* 10.0*    Recent Labs  12/08/16 0411 12/09/16 0435  WBC 10.6* 12.3*  RBC 3.64* 3.44*  HCT 32.2* 30.7*  PLT 219 239    Recent Labs  12/08/16 0411 12/09/16 0435  NA 139 139  K 4.6 4.0  CL 107 106  CO2 25 29  BUN 16 21*  CREATININE 0.72 0.68  GLUCOSE 164* 127*  CALCIUM 8.7* 8.5*   No results for input(s): LABPT, INR in the last 72 hours.  EXAM: General - Patient is Alert and Appropriate Extremity - Neurovascular intact Sensation intact distally Incision - clean, dry Motor Function - intact, moving foot and toes well on exam.   Assessment/Plan: 2 Days Post-Op Procedure(s) (LRB): RIGHT TOTAL KNEE ARTHROPLASTY (Right) Procedure(s) (LRB): RIGHT TOTAL KNEE ARTHROPLASTY (Right) Past Medical History:  Diagnosis Date  . Allergy   . Anxiety   . Arthritis   . GERD (gastroesophageal reflux disease)    Principal Problem:   OA (osteoarthritis) of knee  Estimated body mass index is 32.32 kg/m as calculated from the following:   Height as of this encounter: 4\' 11"   (1.499 m).   Weight as of this encounter: 72.6 kg (160 lb). Up with therapy Diet - Regular diet Follow up - in 2 weeks Activity - WBAT Disposition - Home Condition Upon Discharge - Stable D/C Meds - See DC Summary DVT Prophylaxis - Xarelto  Arlee Muslim, PA-C Orthopaedic Surgery 12/09/2016, 7:49 AM

## 2016-12-22 DIAGNOSIS — M25661 Stiffness of right knee, not elsewhere classified: Secondary | ICD-10-CM | POA: Diagnosis not present

## 2016-12-22 DIAGNOSIS — Z471 Aftercare following joint replacement surgery: Secondary | ICD-10-CM | POA: Diagnosis not present

## 2016-12-22 DIAGNOSIS — Z96651 Presence of right artificial knee joint: Secondary | ICD-10-CM | POA: Diagnosis not present

## 2016-12-22 DIAGNOSIS — M1711 Unilateral primary osteoarthritis, right knee: Secondary | ICD-10-CM | POA: Diagnosis not present

## 2017-01-26 DIAGNOSIS — M25661 Stiffness of right knee, not elsewhere classified: Secondary | ICD-10-CM | POA: Diagnosis not present

## 2017-01-26 DIAGNOSIS — M1711 Unilateral primary osteoarthritis, right knee: Secondary | ICD-10-CM | POA: Diagnosis not present

## 2017-02-15 DIAGNOSIS — F3342 Major depressive disorder, recurrent, in full remission: Secondary | ICD-10-CM | POA: Diagnosis not present

## 2017-03-02 DIAGNOSIS — M1711 Unilateral primary osteoarthritis, right knee: Secondary | ICD-10-CM | POA: Diagnosis not present

## 2017-05-17 DIAGNOSIS — F3342 Major depressive disorder, recurrent, in full remission: Secondary | ICD-10-CM | POA: Diagnosis not present

## 2017-09-21 DIAGNOSIS — H2513 Age-related nuclear cataract, bilateral: Secondary | ICD-10-CM | POA: Diagnosis not present

## 2017-09-21 DIAGNOSIS — D3132 Benign neoplasm of left choroid: Secondary | ICD-10-CM | POA: Diagnosis not present

## 2017-10-04 DIAGNOSIS — H2511 Age-related nuclear cataract, right eye: Secondary | ICD-10-CM | POA: Diagnosis not present

## 2017-10-04 DIAGNOSIS — H2513 Age-related nuclear cataract, bilateral: Secondary | ICD-10-CM | POA: Diagnosis not present

## 2017-10-25 DIAGNOSIS — F3342 Major depressive disorder, recurrent, in full remission: Secondary | ICD-10-CM | POA: Diagnosis not present

## 2017-11-25 DIAGNOSIS — H2511 Age-related nuclear cataract, right eye: Secondary | ICD-10-CM | POA: Diagnosis not present

## 2017-11-26 DIAGNOSIS — H2512 Age-related nuclear cataract, left eye: Secondary | ICD-10-CM | POA: Diagnosis not present

## 2017-11-26 DIAGNOSIS — H2511 Age-related nuclear cataract, right eye: Secondary | ICD-10-CM | POA: Diagnosis not present

## 2017-12-16 DIAGNOSIS — H2512 Age-related nuclear cataract, left eye: Secondary | ICD-10-CM | POA: Diagnosis not present

## 2017-12-16 DIAGNOSIS — H5212 Myopia, left eye: Secondary | ICD-10-CM | POA: Diagnosis not present

## 2018-03-31 ENCOUNTER — Ambulatory Visit (HOSPITAL_COMMUNITY)
Admission: EM | Admit: 2018-03-31 | Discharge: 2018-03-31 | Disposition: A | Discharge status: Home / Self Care | Payer: Medicare Other | Source: Home / Self Care

## 2018-03-31 ENCOUNTER — Encounter (HOSPITAL_COMMUNITY): Payer: Self-pay | Admitting: Emergency Medicine

## 2018-03-31 ENCOUNTER — Emergency Department (HOSPITAL_COMMUNITY): Payer: Medicare Other

## 2018-03-31 ENCOUNTER — Emergency Department (HOSPITAL_COMMUNITY)
Admission: EM | Admit: 2018-03-31 | Discharge: 2018-03-31 | Disposition: A | Discharge status: Home / Self Care | Payer: Medicare Other | Attending: Emergency Medicine | Admitting: Emergency Medicine

## 2018-03-31 ENCOUNTER — Encounter (HOSPITAL_COMMUNITY): Payer: Self-pay | Admitting: *Deleted

## 2018-03-31 ENCOUNTER — Other Ambulatory Visit: Payer: Self-pay

## 2018-03-31 DIAGNOSIS — S098XXA Other specified injuries of head, initial encounter: Secondary | ICD-10-CM | POA: Insufficient documentation

## 2018-03-31 DIAGNOSIS — Y9389 Activity, other specified: Secondary | ICD-10-CM | POA: Insufficient documentation

## 2018-03-31 DIAGNOSIS — S0990XA Unspecified injury of head, initial encounter: Secondary | ICD-10-CM | POA: Diagnosis not present

## 2018-03-31 DIAGNOSIS — R42 Dizziness and giddiness: Secondary | ICD-10-CM | POA: Diagnosis not present

## 2018-03-31 DIAGNOSIS — Y999 Unspecified external cause status: Secondary | ICD-10-CM | POA: Diagnosis not present

## 2018-03-31 DIAGNOSIS — Y92003 Bedroom of unspecified non-institutional (private) residence as the place of occurrence of the external cause: Secondary | ICD-10-CM | POA: Diagnosis not present

## 2018-03-31 DIAGNOSIS — Z96651 Presence of right artificial knee joint: Secondary | ICD-10-CM | POA: Insufficient documentation

## 2018-03-31 DIAGNOSIS — W1839XA Other fall on same level, initial encounter: Secondary | ICD-10-CM | POA: Insufficient documentation

## 2018-03-31 LAB — CBC
HCT: 40.8 % (ref 36.0–46.0)
Hemoglobin: 12.9 g/dL (ref 12.0–15.0)
MCH: 28.9 pg (ref 26.0–34.0)
MCHC: 31.6 g/dL (ref 30.0–36.0)
MCV: 91.5 fL (ref 78.0–100.0)
PLATELETS: 279 10*3/uL (ref 150–400)
RBC: 4.46 MIL/uL (ref 3.87–5.11)
RDW: 13.7 % (ref 11.5–15.5)
WBC: 6.3 10*3/uL (ref 4.0–10.5)

## 2018-03-31 LAB — BASIC METABOLIC PANEL
Anion gap: 10 (ref 5–15)
BUN: 14 mg/dL (ref 8–23)
CO2: 24 mmol/L (ref 22–32)
Calcium: 9.2 mg/dL (ref 8.9–10.3)
Chloride: 105 mmol/L (ref 98–111)
Creatinine, Ser: 0.85 mg/dL (ref 0.44–1.00)
GFR calc Af Amer: >60 mL/min (ref >60)
Glucose, Bld: 103 mg/dL — ABNORMAL HIGH (ref 70–99)
POTASSIUM: 3.9 mmol/L (ref 3.5–5.1)
SODIUM: 139 mmol/L (ref 135–145)

## 2018-03-31 LAB — I-STAT TROPONIN, ED: Troponin i, poc: 0 ng/mL (ref 0.00–0.08)

## 2018-03-31 MED ORDER — MECLIZINE HCL 25 MG PO TABS
25.0000 mg | ORAL_TABLET | Freq: Once | ORAL | Status: AC
  Administered 2018-03-31: 25 mg via ORAL
  Filled 2018-03-31: qty 1

## 2018-03-31 MED ORDER — MECLIZINE HCL 25 MG PO TABS: 25.0000 mg | ORAL_TABLET | Freq: Three times a day (TID) | ORAL | 0 refills | Status: DC | PRN

## 2018-03-31 NOTE — ED Triage Notes (Signed)
Pt in c/o fall and dizziness, fall x 4 days ago, pt c/o dizziness, unable to determine if pt has facial droop d/t her having cotton in her mouth r/t dental issues, pt family says her face is at baseline, denies taking blood thinners, A&O x4

## 2018-03-31 NOTE — Discharge Instructions (Signed)
You were seen in the ER after a mechanical fall, head injury and dizziness.  Your CT head did not show any new injuries or bleeds.  Your cardiac work-up was reassuring.  Based on her symptoms and exam, I suspect he may have vertigo or postconcussion type symptoms.  Take meclizine every 6 hours for dizziness, nausea.  Be careful with sudden head movements, driving.  Recommend using cane and/or assistance with walking to prevent recurrent fall.  Return to the ER for worsening symptoms, severe headache, persistent vision changes, chest pain, shortness of breath, palpitations, one-sided numbness or weakness to extremities.

## 2018-03-31 NOTE — ED Provider Notes (Signed)
[facility] EMERGENCY DEPARTMENT Provider Note   CSN: [id number] Arrival date & time: 03/31/18  1053     History   Chief Complaint Chief Complaint  Patient presents with  . Fall    HPI Courtney Avila is a 77 y.o. female is here for evaluation of dizziness.  Described as "things moving", "shifting", intermittent, brief.  Onset on Sunday after mechanical fall.  Patient states she was kneeling down, on her knees at the side of her bed when she try to get up she lost her balance and fell backwards hitting a nearby fan.  She hit the back/top of her head.  Took a few minutes but was able to get up on her own and laid back in bed.  She noticed dizziness soon after head injury and the following morning.  Exacerbated by head movements, walking.  She has noticed her balance is off and feels like she stumbles and has to hang onto the walls intermittently.  Feels shaky with walking due to thinks shifting.  Initially had a mild headache after the fall but this has resolved.  Other associated symptoms include right-sided, mild neck pain that is worse with movement and palpation, as well as knots on the left posterior scalp that she attributes to the trauma to the scalp after the fall.  Denies history of vertigo.  Denies double vision, difficulty with speech, one-sided weakness or loss of sensation, nausea, vomiting, chest pain, palpitations, shortness of breath, abdominal pain.  No anticoagulants on board.  No associated LOC, seizure-like activity, vomiting after the fall.  No interventions.  No alleviating factors.  HPI  Past Medical History:  Diagnosis Date  . Allergy   . Anxiety   . Arthritis   . GERD (gastroesophageal reflux disease)     Patient Active Problem List   Diagnosis Date Noted  . OA (osteoarthritis) of knee 12/07/2016  . 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 ABDOMINAL HYSTERECTOMY      . ANKLE SURGERY     growth lipoma removed  . COLONOSCOPY    . MULTIPLE TOOTH EXTRACTIONS    . REVERSE SHOULDER ARTHROPLASTY Right 04/09/2016  . REVERSE SHOULDER ARTHROPLASTY Right 04/09/2016   Procedure: RIGHT REVERSE SHOULDER ARTHROPLASTY;  Surgeon: Justice Britain, MD;  Location: [facility];  Service: Orthopedics;  Laterality: Right;  . TOTAL KNEE ARTHROPLASTY Right 12/07/2016   Procedure: RIGHT TOTAL KNEE ARTHROPLASTY;  Surgeon: Gaynelle Arabian, MD;  Location: [facility];  Service: Orthopedics;  Laterality: Right;     OB History   None      Home Medications    Prior to Admission medications   Medication Sig Start Date End Date Taking? Authorizing Provider  busPIRone (BUSPAR) 10 MG tablet Take 10 mg by mouth daily as needed (for anxiety).    Yes [provider]  calcium carbonate (TUMS EX) 750 MG chewable tablet Chew 2 tablets by mouth as needed for heartburn.   Yes [provider]  fluticasone (FLONASE) 50 MCG/ACT nasal spray Place 1 spray into both nostrils daily as needed for allergies or rhinitis.   Yes [provider]  Multiple Vitamins-Minerals (HAIR SKIN AND NAILS FORMULA PO) Take 1 tablet by mouth daily.   Yes [provider]  PARoxetine (PAXIL) 40 MG tablet Take 40 mg by mouth daily.   Yes [provider]  temazepam (RESTORIL) 30 MG capsule Take 30 mg by mouth at bedtime.  Yes [provider]  meclizine (ANTIVERT) 25 MG tablet Take 1 tablet (25 mg total) by mouth 3 (three) times daily as needed for dizziness. 03/31/18   Kinnie Feil, PA-C  methocarbamol (ROBAXIN) 500 MG tablet Take 1 tablet (500 mg total) by mouth every 6 (six) hours as needed for muscle spasms. Patient not taking: Reported on 03/31/2018 12/08/16   Dara Lords, Alexzandrew L, PA-C  oxyCODONE (OXY IR/ROXICODONE) 5 MG immediate release tablet Take 1-2 tablets (5-10 mg total) by mouth every 4 (four) hours as needed for moderate pain or severe pain. Patient not taking:  Reported on 03/31/2018 12/08/16   Dara Lords, Alexzandrew L, PA-C  rivaroxaban (XARELTO) 10 MG TABS tablet Take 1 tablet (10 mg total) by mouth daily with breakfast. Take Xarelto for two and a half more weeks following discharge from the hospital, then discontinue Xarelto. Once the patient has completed the blood thinner regimen, then take a Baby 81 mg Aspirin daily for three more weeks. Patient not taking: Reported on 03/31/2018 12/09/16   Joelene Millin, PA-C    Family History Family History  Problem Relation Age of Onset  . Diabetes Mother   . Hypertension Mother   . Cancer Father   . Heart disease Father   . Cancer Sister     Social History Social History   Tobacco Use  . Smoking status: Never Smoker  . Smokeless tobacco: Never Used  Substance Use Topics  . Alcohol use: No    Alcohol/week: 0.0 standard drinks  . Drug use: No     Allergies   Patient has no known allergies.   Review of Systems Review of Systems  Neurological: Positive for dizziness and headaches (resolved).  All other systems reviewed and are negative.    Physical Exam Updated Vital Signs BP 119/68   Pulse 60   Temp 98.4 F (36.9 C) (Oral)   Resp (!) 26   SpO2 99%   Physical Exam  Constitutional: She appears well-developed and well-nourished.  NAD. Non toxic.   HENT:  Head: Normocephalic. Head is with contusion.  Right Ear: External ear normal.  Left Ear: External ear normal.  Nose: Nose normal.  Mouth/Throat: Oropharynx is clear and moist.  TMs normal. No mastoid tenderness. No obvious nasal mucosal edema. No sinus tenderness.  Mild posterior scalp tenderness and fullness, no scalp abrasion or laceration.  Eyes: Conjunctivae are normal.  Neck: Normal range of motion. Muscular tenderness present.  C-spine: No midline tenderness.  Mild right-sided paraspinal muscle tenderness.  Full active R OM without significant difficulty.  Cardiovascular: Normal rate, regular rhythm and normal heart  sounds.  2+ DP and radial pulses bilaterally. No LE edema or calf tenderness.   Pulmonary/Chest: Effort normal and breath sounds normal.  Abdominal: Soft. There is no tenderness.  Musculoskeletal: Normal range of motion.  Neurological:  Alert and oriented to self, place, time and event.  Speech is fluent without obvious dysarthria or dysphasia. Strength 5/5 with hand grip and ankle F/E.   Sensation to light touch intact in hands and feet. Normal gait. No pronator drift. No leg drop.  Normal finger-to-nose and finger tapping.  CN I and VIII not tested. CN II-XII grossly intact bilaterally.  No ankle clonus.   Head impulse: saccade correction back midline. Symptoms exacerbated with head rotation. Nystagmus: subtle left horizontal nystagmus.  Test of skey: normal vertical eye alignment   Skin: Skin is warm and dry. Capillary refill takes less than 2 seconds.  Psychiatric: She has a  normal mood and affect. Her behavior is normal.     ED Treatments / Results  Labs (all labs ordered are listed, but only abnormal results are displayed) Labs Reviewed  BASIC METABOLIC PANEL - Abnormal; Notable for the following components:      Result Value   Glucose, Bld 103 (*)    All other components within normal limits  CBC  URINALYSIS, ROUTINE W REFLEX MICROSCOPIC  I-STAT TROPONIN, ED  CBG MONITORING, ED    EKG EKG Interpretation  Date/Time:  Thursday March 31 2018 11:11:06 EDT Ventricular Rate:  61 PR Interval:  176 QRS Duration: 80 QT Interval:  434 QTC Calculation: 436 R Axis:   7 Text Interpretation:  Normal sinus rhythm Low voltage QRS Borderline ECG No significant change since last tracing Confirmed by Theotis Burrow [phone]) on 03/31/2018 2:21:52 PM   Radiology Ct Head Wo Contrast  Result Date: 03/31/2018 CLINICAL DATA:  Status post fall four days ago with dizziness. EXAM: CT HEAD WITHOUT CONTRAST TECHNIQUE: Contiguous axial images were obtained from the base of the skull  through the vertex without intravenous contrast. COMPARISON:  None. FINDINGS: Brain: No evidence of acute infarction, hemorrhage, hydrocephalus, extra-axial collection or mass lesion/mass effect. There is chronic diffuse atrophy. Chronic bilateral periventricular white matter small vessel ischemic changes identified. Vascular: No hyperdense vessel is identified. Skull: Normal. Negative for fracture or focal lesion. Sinuses/Orbits: No acute finding. Other: Mild right posterior parietal scalp swelling is identified. IMPRESSION: No focal acute intracranial abnormality identified. Chronic diffuse atrophy and chronic bilateral periventricular white matter small vessel ischemic change. Mild right posterior parietal scalp swelling. Electronically Signed   By: Abelardo Diesel M.D.   On: 03/31/2018 13:59    Procedures Procedures (including critical care time)  Medications Ordered in ED Medications  meclizine (ANTIVERT) tablet 25 mg (25 mg Oral Given 03/31/18 1454)     Initial Impression / Assessment and Plan / ED Course  I have reviewed the triage vital signs and the nursing notes.  Pertinent labs & imaging results that were available during my care of the patient were reviewed by me and considered in my medical decision making (see chart for details).  Clinical Course as of Mar 31 1550  Thu Mar 31, 2018  1519 IMPRESSION: No focal acute intracranial abnormality identified.  Chronic diffuse atrophy and chronic bilateral periventricular white matter small vessel ischemic change.  Mild right posterior parietal scalp swelling.   CT Head Wo Contrast [CG]    Clinical Course User Index [CG] Kinnie Feil, PA-C   77 year old female presents with intermittent episodes of dizziness x4 days after mechanical fall.  Associated with difficulty walking as dizziness is making her unsteady.  Neuro exam today is normal.  Hints exam normal.  Head rotation exacerbates her symptoms.  No associated red flag  symptoms including focal weakness or sensory deficits to extremities, thunderclap headache, associated LOC, chest pain, shortness of breath, palpitations, diplopia, dysarthria, dysmetria.  I personally ambulated patient who had steady gait without ataxia.  ENT exam normal.  VS reassuring.  Symptoms sound vertiginous.  Arrhythmias and central cause considered but less likely.  1515: No hyponatremia or other electrolyte abnormalities. Troponin x 1 normal.  EKG without arrhythmias.  CT head given previous head trauma w/o significant new findings.  Ambulatory in ER with cautious but steady gait, feels better after meclizine. Patient is considered safe to discharge at this time with meclizine for presumed vertigo vs post concussive syndrome.  Lower suspicion for central cause of  symptoms including stroke or TIA.  Doubt cardiac arrhythmia, polypharmacy.  Will discharge with close PCP f/u, strict ED return precautions.  Patient aware of red flag symptoms to monitor for that would warrant return to ED. Pt and family at bedside comfortable with this plan.   Patient, ED treatment and discharge plan was discussed with supervising physician who is agreeable with plan.   Final Clinical Impressions(s) / ED Diagnoses   Final diagnoses:  Injury of head, initial encounter  Dizziness    ED Discharge Orders         Ordered    meclizine (ANTIVERT) 25 MG tablet  3 times daily PRN     03/31/18 1551           Kinnie Feil, PA-C 03/31/18 1551    Little, Wenda Overland, MD 04/02/18 1727

## 2018-03-31 NOTE — ED Notes (Signed)
Discharge instructions and prescriptions discussed with Pt. Pt verbalized understanding. Pt stable and ambulatory.   

## 2018-03-31 NOTE — ED Notes (Addendum)
PT directed to ED for eval by Dr. Joseph Art. PT and family refuse assistance getting to ED, but agree to go in private vehicle.

## 2018-03-31 NOTE — ED Triage Notes (Signed)
PT fell while trying to get into bed four days ago. PT struck right side of head. PT reports she had some swelling over that area. PT reports dizziness since the fall, a sensation that the room is spinning. PT denies being on blood thinners. Left sided facial droop apparent when PT is talking.

## 2018-05-20 DIAGNOSIS — F3342 Major depressive disorder, recurrent, in full remission: Secondary | ICD-10-CM | POA: Diagnosis not present

## 2019-07-09 ENCOUNTER — Emergency Department (HOSPITAL_COMMUNITY): Payer: PPO

## 2019-07-09 ENCOUNTER — Emergency Department (HOSPITAL_COMMUNITY)
Admission: EM | Admit: 2019-07-09 | Discharge: 2019-07-09 | Disposition: A | Discharge status: Home / Self Care | Payer: PPO | Attending: Emergency Medicine | Admitting: Emergency Medicine

## 2019-07-09 ENCOUNTER — Encounter (HOSPITAL_COMMUNITY): Payer: Self-pay

## 2019-07-09 ENCOUNTER — Other Ambulatory Visit: Payer: Self-pay

## 2019-07-09 DIAGNOSIS — Y999 Unspecified external cause status: Secondary | ICD-10-CM | POA: Diagnosis not present

## 2019-07-09 DIAGNOSIS — Y939 Activity, unspecified: Secondary | ICD-10-CM | POA: Diagnosis not present

## 2019-07-09 DIAGNOSIS — Z7901 Long term (current) use of anticoagulants: Secondary | ICD-10-CM | POA: Diagnosis not present

## 2019-07-09 DIAGNOSIS — W19XXXA Unspecified fall, initial encounter: Secondary | ICD-10-CM

## 2019-07-09 DIAGNOSIS — S0990XA Unspecified injury of head, initial encounter: Secondary | ICD-10-CM

## 2019-07-09 DIAGNOSIS — Y92013 Bedroom of single-family (private) house as the place of occurrence of the external cause: Secondary | ICD-10-CM | POA: Diagnosis not present

## 2019-07-09 DIAGNOSIS — W06XXXA Fall from bed, initial encounter: Secondary | ICD-10-CM | POA: Insufficient documentation

## 2019-07-09 DIAGNOSIS — S0001XA Abrasion of scalp, initial encounter: Secondary | ICD-10-CM | POA: Insufficient documentation

## 2019-07-09 DIAGNOSIS — Z96651 Presence of right artificial knee joint: Secondary | ICD-10-CM | POA: Insufficient documentation

## 2019-07-09 DIAGNOSIS — S0003XA Contusion of scalp, initial encounter: Secondary | ICD-10-CM | POA: Diagnosis not present

## 2019-07-09 DIAGNOSIS — S199XXA Unspecified injury of neck, initial encounter: Secondary | ICD-10-CM | POA: Diagnosis not present

## 2019-07-09 NOTE — ED Provider Notes (Signed)
[facility] DEPT Provider Note   CSN: [id number] Arrival date & time: 07/09/19  0119     History Chief Complaint  Patient presents with  . Fall    Courtney Avila is a 79 y.o. female.  Patient presents to the emergency department with a chief complaint of fall from bed.  She was found by her son.  Son states that the patient appeared to have rolled out of bed and landed on a glass cup.  She sustained an abrasion to the back of her head.  Son states that she is confused at baseline, and may have doubled up on her medication tonight.  He states that she sometimes hallucinates.  Patient denies any pain now.  She is not anticoagulated.  Denies numbness, weakness, or tingling.  The history is provided by the patient. No language interpreter was used.       Past Medical History:  Diagnosis Date  . Allergy   . Anxiety   . Arthritis   . GERD (gastroesophageal reflux disease)     Patient Active Problem List   Diagnosis Date Noted  . OA (osteoarthritis) of knee 12/07/2016  . 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 ABDOMINAL HYSTERECTOMY    . ANKLE SURGERY     growth lipoma removed  . COLONOSCOPY    . MULTIPLE TOOTH EXTRACTIONS    . REVERSE SHOULDER ARTHROPLASTY Right 04/09/2016  . REVERSE SHOULDER ARTHROPLASTY Right 04/09/2016   Procedure: RIGHT REVERSE SHOULDER ARTHROPLASTY;  Surgeon: Justice Britain, MD;  Location: [facility];  Service: Orthopedics;  Laterality: Right;  . TOTAL KNEE ARTHROPLASTY Right 12/07/2016   Procedure: RIGHT TOTAL KNEE ARTHROPLASTY;  Surgeon: Gaynelle Arabian, MD;  Location: [facility];  Service: Orthopedics;  Laterality: Right;     OB History   No obstetric history on file.     Family History  Problem Relation Age of Onset  . Diabetes Mother   . Hypertension Mother   . Cancer Father   . Heart disease Father   . Cancer Sister     Social History    Tobacco Use  . Smoking status: Never Smoker  . Smokeless tobacco: Never Used  Substance Use Topics  . Alcohol use: No    Alcohol/week: 0.0 standard drinks  . Drug use: No    Home Medications Prior to Admission medications   Medication Sig Start Date End Date Taking? Authorizing Provider  busPIRone (BUSPAR) 10 MG tablet Take 10 mg by mouth daily as needed (for anxiety).     [provider]  calcium carbonate (TUMS EX) 750 MG chewable tablet Chew 2 tablets by mouth as needed for heartburn.    [provider]  fluticasone (FLONASE) 50 MCG/ACT nasal spray Place 1 spray into both nostrils daily as needed for allergies or rhinitis.    [provider]  meclizine (ANTIVERT) 25 MG tablet Take 1 tablet (25 mg total) by mouth 3 (three) times daily as needed for dizziness. 03/31/18   Kinnie Feil, PA-C  methocarbamol (ROBAXIN) 500 MG tablet Take 1 tablet (500 mg total) by mouth every 6 (six) hours as needed for muscle spasms. Patient not taking: Reported on 03/31/2018 12/08/16   Dara Lords, Alexzandrew L, PA-C  Multiple Vitamins-Minerals (HAIR SKIN AND NAILS FORMULA PO) Take 1 tablet by mouth daily.    [provider]  oxyCODONE (OXY IR/ROXICODONE) 5 MG immediate release tablet Take 1-2 tablets (5-10 mg  total) by mouth every 4 (four) hours as needed for moderate pain or severe pain. Patient not taking: Reported on 03/31/2018 12/08/16   Dara Lords, Alexzandrew L, PA-C  PARoxetine (PAXIL) 40 MG tablet Take 40 mg by mouth daily.    [provider]  rivaroxaban (XARELTO) 10 MG TABS tablet Take 1 tablet (10 mg total) by mouth daily with breakfast. Take Xarelto for two and a half more weeks following discharge from the hospital, then discontinue Xarelto. Once the patient has completed the blood thinner regimen, then take a Baby 81 mg Aspirin daily for three more weeks. Patient not taking: Reported on 03/31/2018 12/09/16   Dara Lords, Alexzandrew L, PA-C  temazepam  (RESTORIL) 30 MG capsule Take 30 mg by mouth at bedtime.    [provider]    Allergies    Patient has no known allergies.  Review of Systems   Review of Systems  All other systems reviewed and are negative.   Physical Exam Updated Vital Signs BP 119/83 (BP Location: Left Arm)   Pulse 77   Temp 98.4 F (36.9 C) (Oral)   Resp 15   SpO2 97%   Physical Exam Vitals and nursing note reviewed.  Constitutional:      General: She is not in acute distress.    Appearance: She is well-developed.  HENT:     Head: Normocephalic and atraumatic.     Comments: Minor abrasion to right posteroparietal scalp, no lacerations Eyes:     Conjunctiva/sclera: Conjunctivae normal.  Cardiovascular:     Rate and Rhythm: Normal rate and regular rhythm.     Heart sounds: No murmur.  Pulmonary:     Effort: Pulmonary effort is normal. No respiratory distress.     Breath sounds: Normal breath sounds.  Abdominal:     Palpations: Abdomen is soft.     Tenderness: There is no abdominal tenderness.  Musculoskeletal:     Cervical back: Neck supple.  Skin:    General: Skin is warm and dry.  Neurological:     Mental Status: She is alert and oriented to person, place, and time.     Comments: CN 3-12 intact, normal finger counting, movements are goal directed, ambulatory without assistance  Psychiatric:        Mood and Affect: Mood normal.        Behavior: Behavior normal.     ED Results / Procedures / Treatments   Labs (all labs ordered are listed, but only abnormal results are displayed) Labs Reviewed - No data to display  EKG None  Radiology CT Head Wo Contrast  Result Date: 07/09/2019 CLINICAL DATA:  Head trauma, fall striking back of head at home. EXAM: CT HEAD WITHOUT CONTRAST TECHNIQUE: Contiguous axial images were obtained from the base of the skull through the vertex without intravenous contrast. COMPARISON:  Head CT 03/31/2018 FINDINGS: Brain: No intracranial hemorrhage, mass  effect, or midline shift. Brain volume is normal for age. No hydrocephalus. The basilar cisterns are patent. Mild chronic small vessel ischemia. No evidence of territorial infarct or acute ischemia. No extra-axial or intracranial fluid collection. Vascular: No hyperdense vessel. Skull: No fracture or focal lesion. Sinuses/Orbits: Paranasal sinuses and mastoid air cells are clear. The visualized orbits are unremarkable. Bilateral cataract resection. Other: Right parietal scalp hematoma. IMPRESSION: 1. Right parietal scalp hematoma. No acute intracranial abnormality. No skull fracture. 2. Mild chronic small vessel ischemia. Electronically Signed   By: Keith Rake M.D.   On: 07/09/2019 04:56   CT Cervical  Spine Wo Contrast  Result Date: 07/09/2019 CLINICAL DATA:  Head trauma, fall at home. EXAM: CT CERVICAL SPINE WITHOUT CONTRAST TECHNIQUE: Multidetector CT imaging of the cervical spine was performed without intravenous contrast. Multiplanar CT image reconstructions were also generated. COMPARISON:  None. FINDINGS: Alignment: Mild reversal of normal lordosis. No traumatic subluxation. Skull base and vertebrae: No acute fracture. Vertebral body heights are maintained. The dens and skull base are intact. Degenerative cystic change at C1-C2. Soft tissues and spinal canal: No prevertebral fluid or swelling. No visible canal hematoma. Disc levels: Diffuse disc space narrowing and endplate spurring. Diffuse facet hypertrophy. Upper chest: Negative. Other: None. 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 Electronically Signed   By: Keith Rake M.D.   On: 07/09/2019 04:59    Procedures Procedures (including critical care time)  Medications Ordered in ED Medications - No data to display  ED Course  I have reviewed the triage vital signs and the nursing notes.  Pertinent labs & imaging results that were available during my care of the  patient were reviewed by me and considered in my medical decision making (see chart for details).    MDM Rules/Calculators/A&P                      Patient with fall from bed.  She has a small abrasion on her right posterior scalp.  No lacerations.  CT head and cervical spine show no acute traumatic injuries.  She is at her baseline per her son.  Vital signs are stable.  Will discharge home. Final Clinical Impression(s) / ED Diagnoses Final diagnoses:  Fall, initial encounter  Injury of head, initial encounter    Rx / DC Orders ED Discharge Orders    None       Montine Circle, PA-C 07/09/19 0534    Merryl Hacker, MD 07/09/19 [id number]

## 2019-07-09 NOTE — ED Notes (Signed)
Pt ambulated up and down the halls with one assist to stretch out her legs. She is in good spirits.

## 2019-07-09 NOTE — ED Triage Notes (Signed)
Per son, pt rolled out of bed. He found her on the floor. Laceration noted to the back of her head. Confused at baseline. Son states that she maybe have accidentally doubled her medication tonight.,

## 2019-07-09 NOTE — ED Notes (Signed)
Pt ambulated with one person assist out of the ED in no distress.

## 2019-07-24 ENCOUNTER — Other Ambulatory Visit: Payer: Self-pay

## 2019-07-24 ENCOUNTER — Emergency Department (HOSPITAL_COMMUNITY)
Admission: EM | Admit: 2019-07-24 | Discharge: 2019-07-24 | Disposition: A | Discharge status: Home / Self Care | Payer: PPO | Attending: Emergency Medicine | Admitting: Emergency Medicine

## 2019-07-24 ENCOUNTER — Encounter (HOSPITAL_COMMUNITY): Payer: Self-pay | Admitting: Emergency Medicine

## 2019-07-24 DIAGNOSIS — Z5321 Procedure and treatment not carried out due to patient leaving prior to being seen by health care provider: Secondary | ICD-10-CM | POA: Diagnosis not present

## 2019-07-24 DIAGNOSIS — R1031 Right lower quadrant pain: Secondary | ICD-10-CM | POA: Diagnosis not present

## 2019-07-24 NOTE — ED Notes (Signed)
Pt not seen in lobby when called for vitals or to collect urine specimen and labs.

## 2019-07-24 NOTE — ED Triage Notes (Signed)
Pt c/o right side abd pains that started couple hours ago. Denies any n/v/d or urinary problems.

## 2019-08-01 ENCOUNTER — Encounter: Payer: Medicare Other | Admitting: Adult Health Nurse Practitioner

## 2019-09-18 DIAGNOSIS — F3342 Major depressive disorder, recurrent, in full remission: Secondary | ICD-10-CM | POA: Diagnosis not present

## 2019-12-21 DIAGNOSIS — F3342 Major depressive disorder, recurrent, in full remission: Secondary | ICD-10-CM | POA: Diagnosis not present

## 2020-06-27 DIAGNOSIS — F3342 Major depressive disorder, recurrent, in full remission: Secondary | ICD-10-CM | POA: Diagnosis not present

## 2020-07-13 IMAGING — CT CT HEAD W/O CM
3 series · 14 of 47 positions shown, 16 images · non-contrast
Comparison: Head CT 03/31/2018

CLINICAL DATA: Head trauma, fall striking back of head at home.

EXAM:
CT HEAD WITHOUT CONTRAST
TECHNIQUE: Contiguous axial images were obtained from the base of the skull
through the vertex without intravenous contrast.

[Series 1: head bone · axial · 0.47mm/px · z∈[-142,-22]mm · 8 of 48 slices shown, 10 images]
[im 4/48  brain]
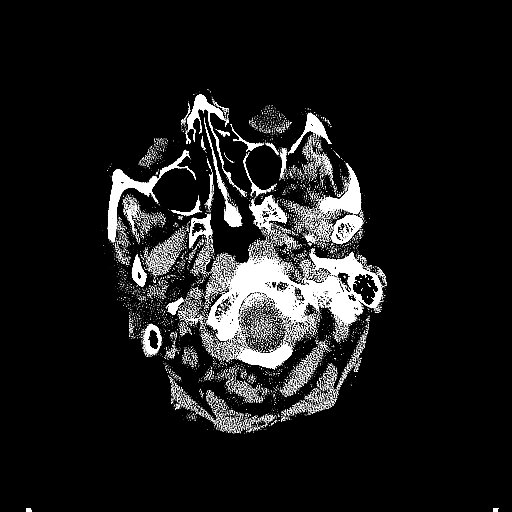
[im 4/48  bone]
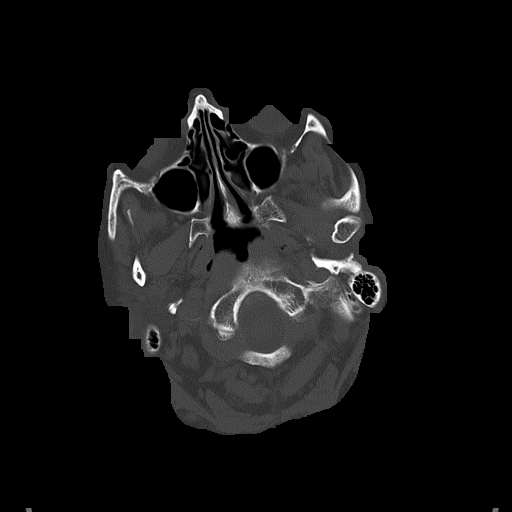
[im 10/48  brain]
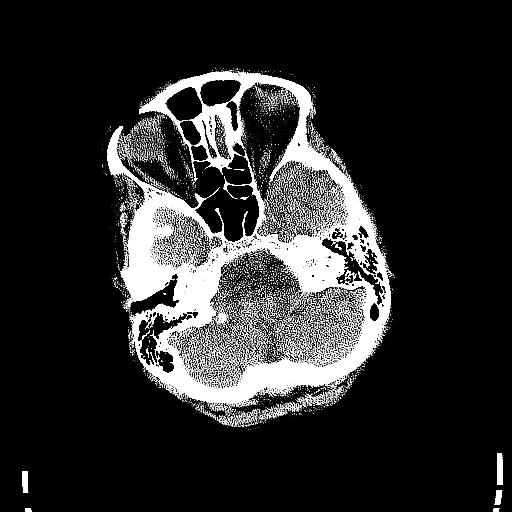
[im 15/48  brain]
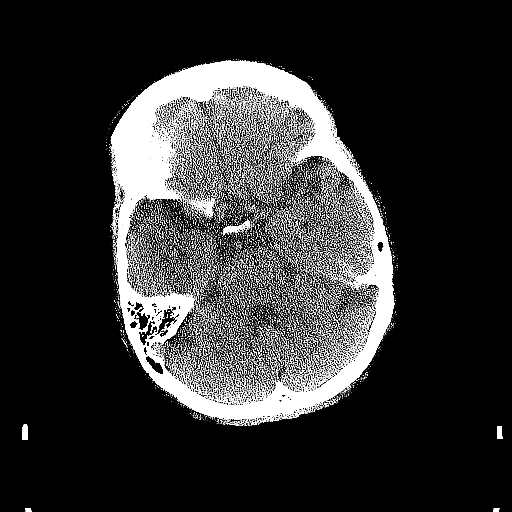
[im 22/48  brain]
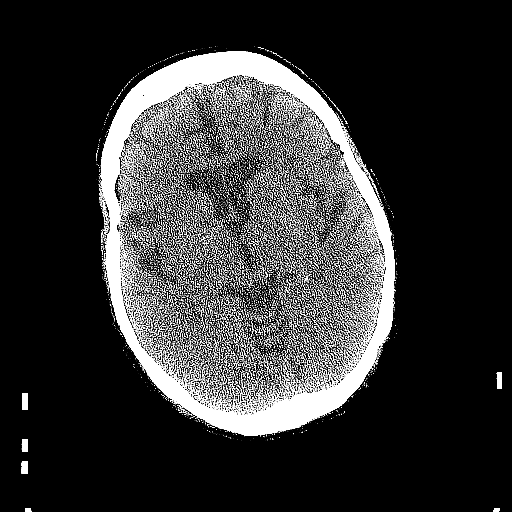
[im 26/48  brain]
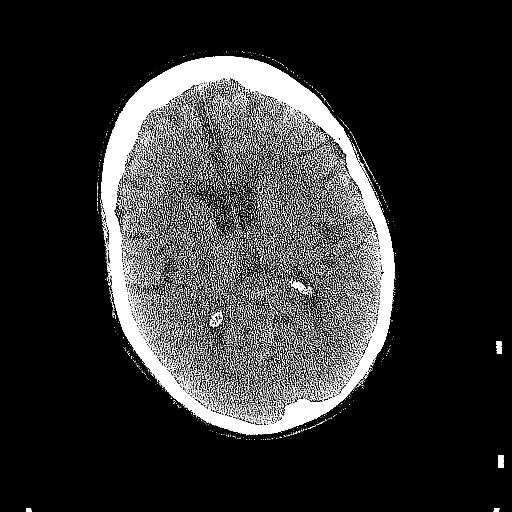
[im 26/48  bone]
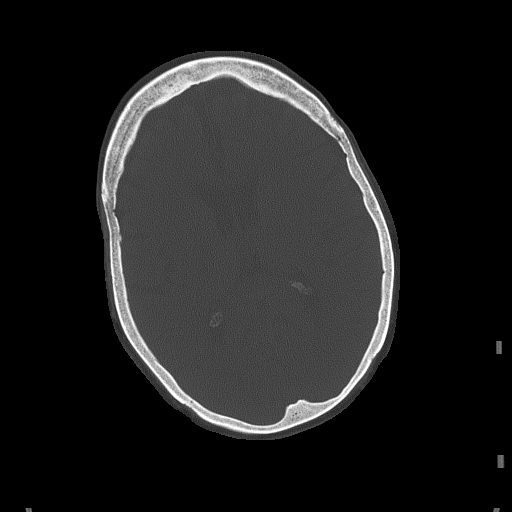
[im 33/48  brain]
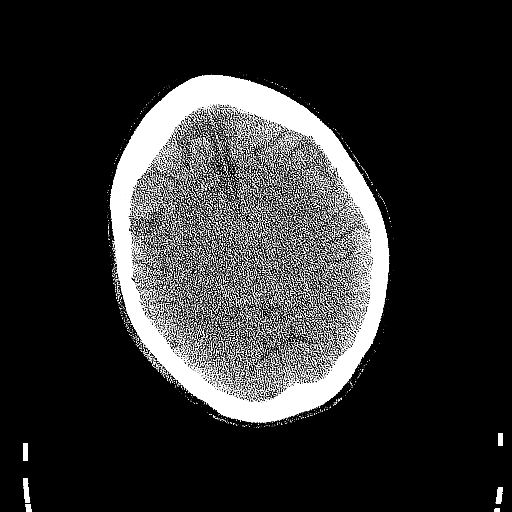
[im 38/48  brain]
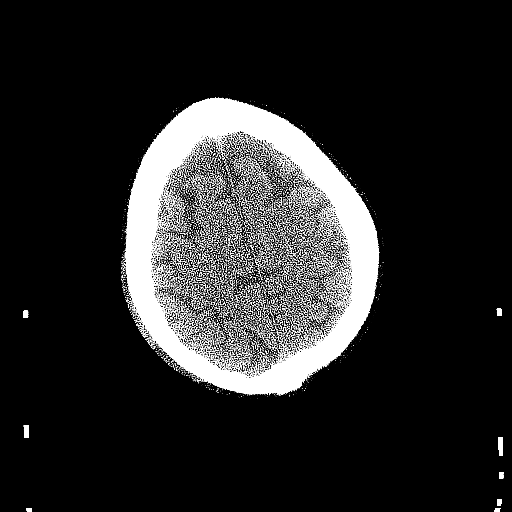
[im 44/48  brain]
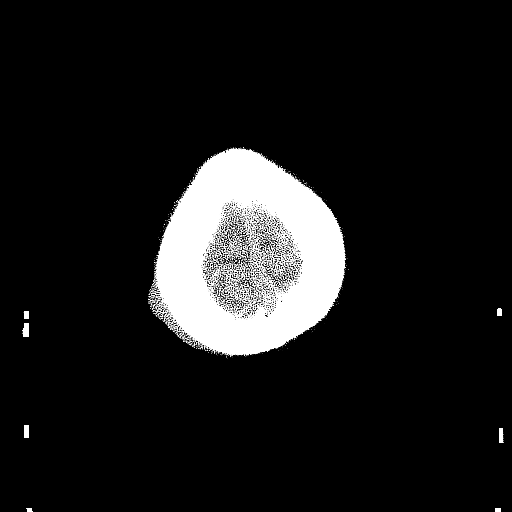

[Series 4: coronal soft tissue · coronal · 0.29mm/px · 3 of 65 slices shown]
[im 22/65  brain]
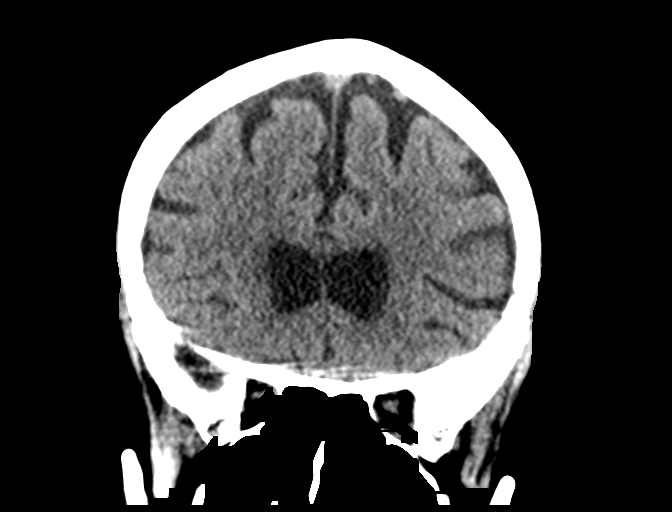
[im 29/65  brain]
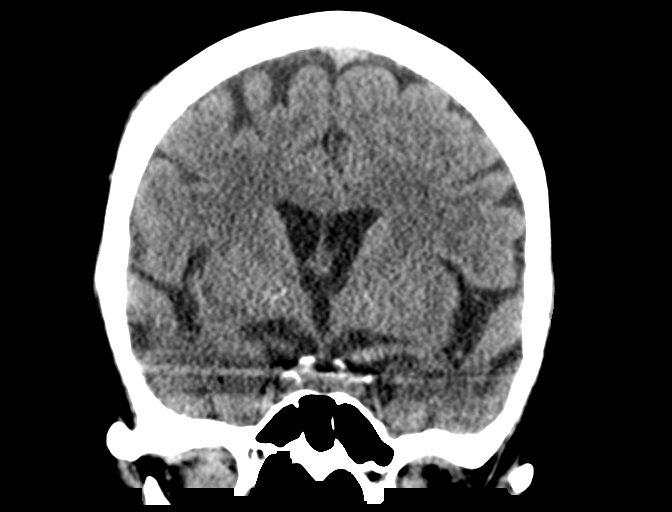
[im 36/65  brain]
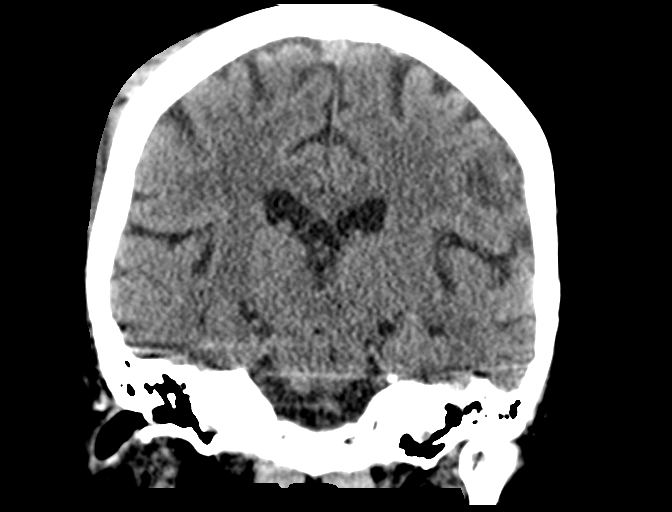

[Series 5: sagittal soft tissue · sagittal · 0.28mm/px · 3 of 51 slices shown]
[im 17/51  brain]
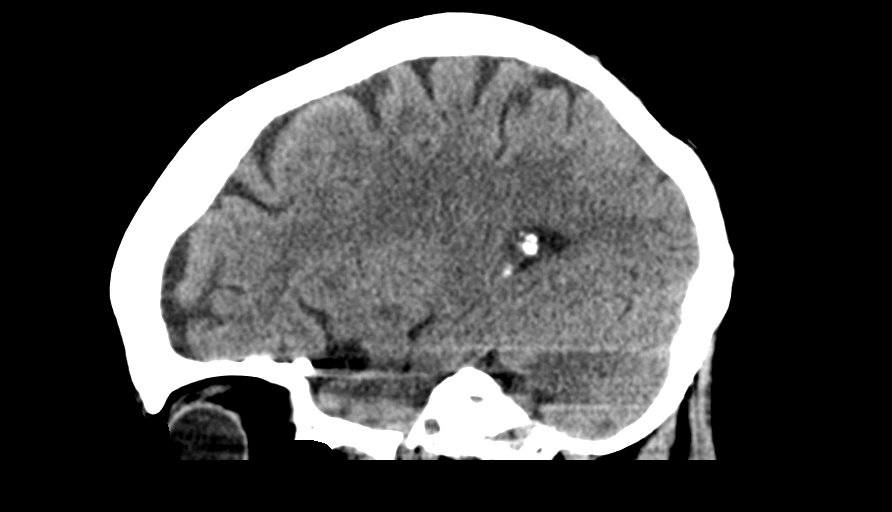
[im 26/51  brain]
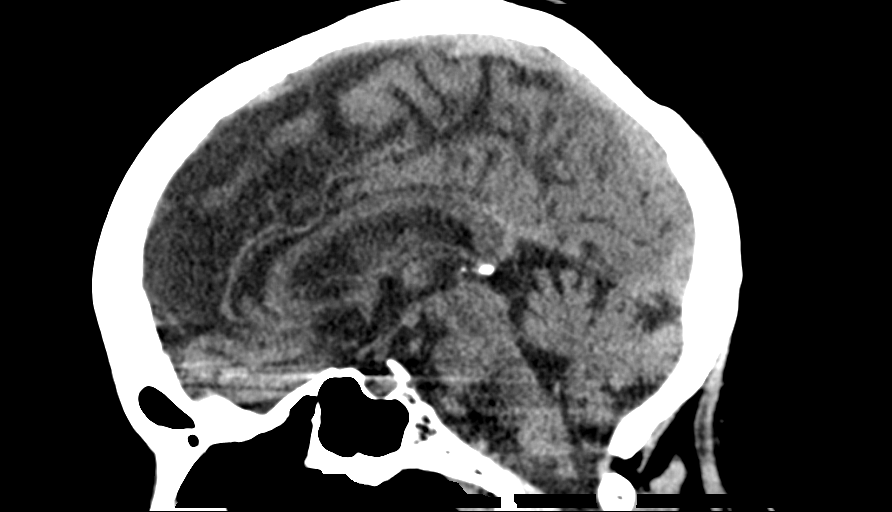
[im 34/51  brain]
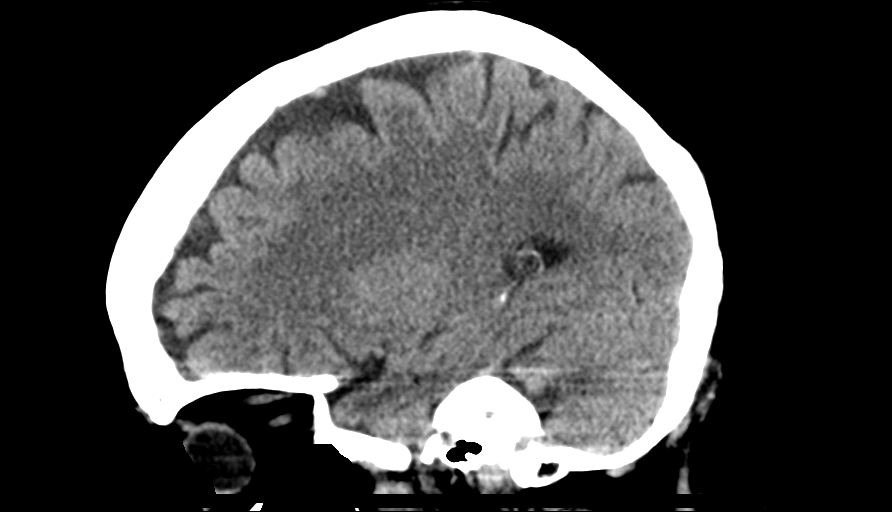

[14 of 47 positions shown; findings below may reference images not displayed]

FINDINGS: Brain: No intracranial hemorrhage, mass effect, or midline shift.
Brain volume is normal for age. No hydrocephalus. The basilar
cisterns are patent. Mild chronic small vessel ischemia. No evidence
of territorial infarct or acute ischemia. No extra-axial or
intracranial fluid collection.

Vascular: No hyperdense vessel.

Skull: No fracture or focal lesion.

Sinuses/Orbits: Paranasal sinuses and mastoid air cells are clear.
The visualized orbits are unremarkable. Bilateral cataract
resection.

Other: Right parietal scalp hematoma.
IMPRESSION: 1. Right parietal scalp hematoma. No acute intracranial abnormality.
No skull fracture.
2. Mild chronic small vessel ischemia.

## 2020-07-13 IMAGING — CT CT CERVICAL SPINE W/O CM
1 series · 12 of 14 positions shown, 15 images · non-contrast
Comparison: None.

CLINICAL DATA: Head trauma, fall at home.

EXAM:
CT CERVICAL SPINE WITHOUT CONTRAST
TECHNIQUE: Multidetector CT imaging of the cervical spine was performed without
intravenous contrast. Multiplanar CT image reconstructions were also
generated.

[Series 5: c spine soft · axial · 0.35mm/px · z∈[-262,-116]mm · 12 of 87 slices shown, 15 images]
[im 7/87  soft-tissue]
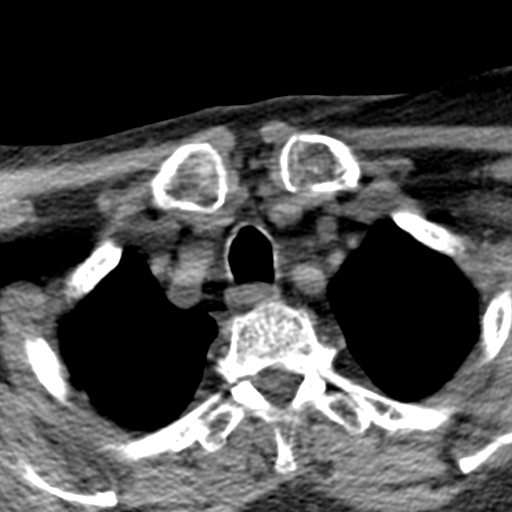
[im 7/87  bone]
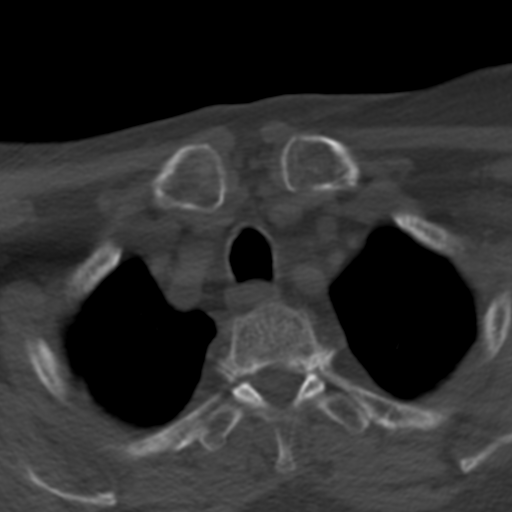
[im 14/87  bone]
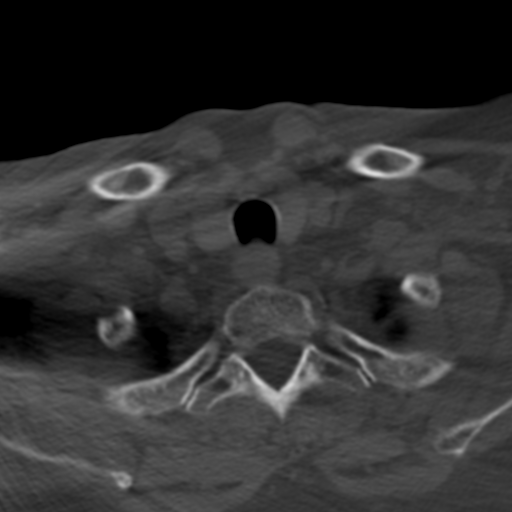
[im 20/87  bone]
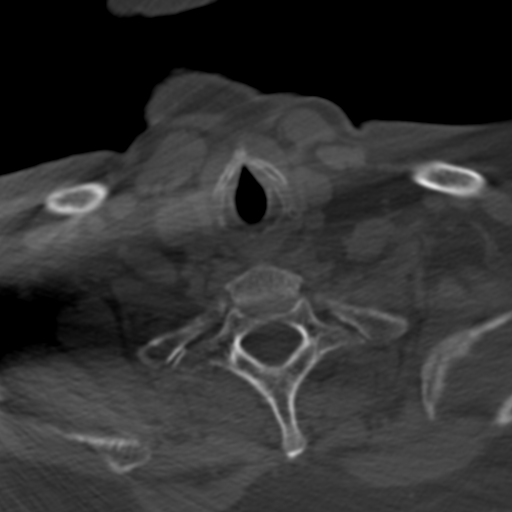
[im 27/87  bone]
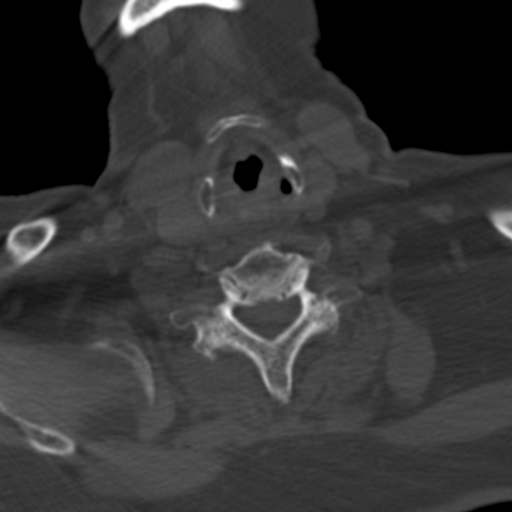
[im 34/87  soft-tissue]
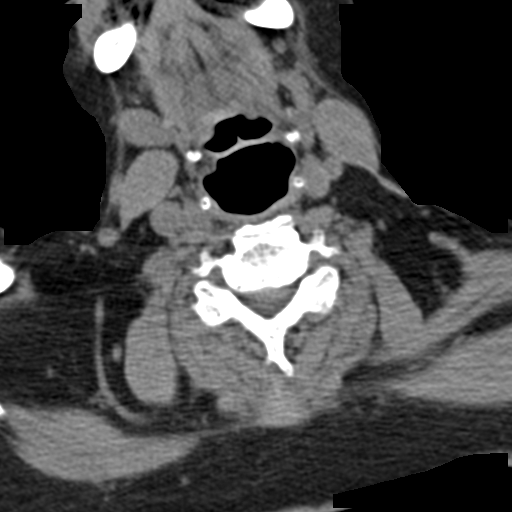
[im 34/87  bone]
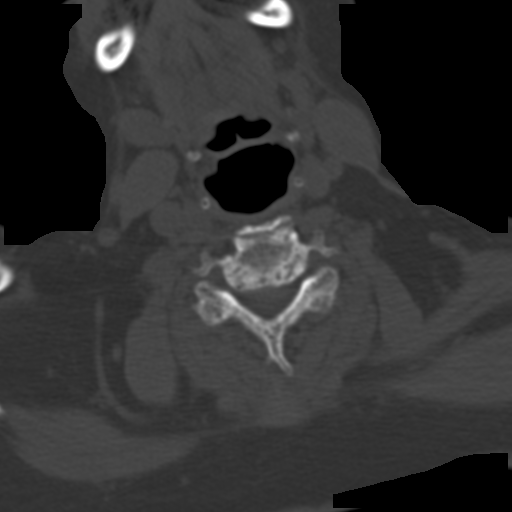
[im 40/87  bone]
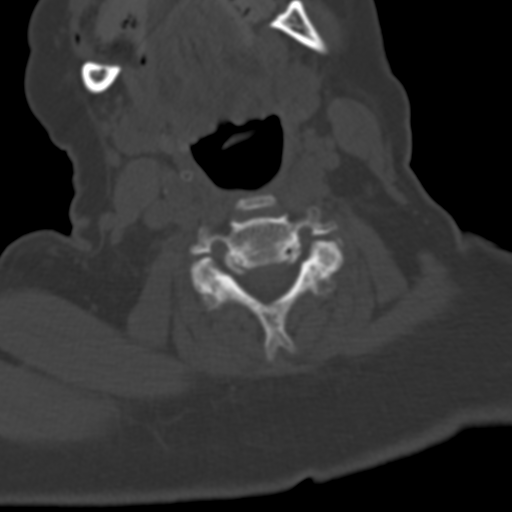
[im 47/87  bone]
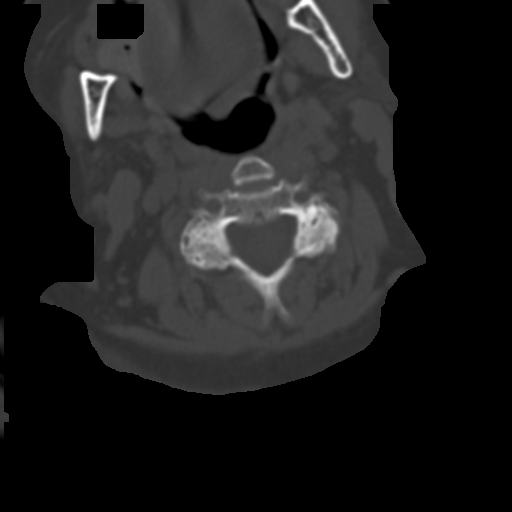
[im 53/87  bone]
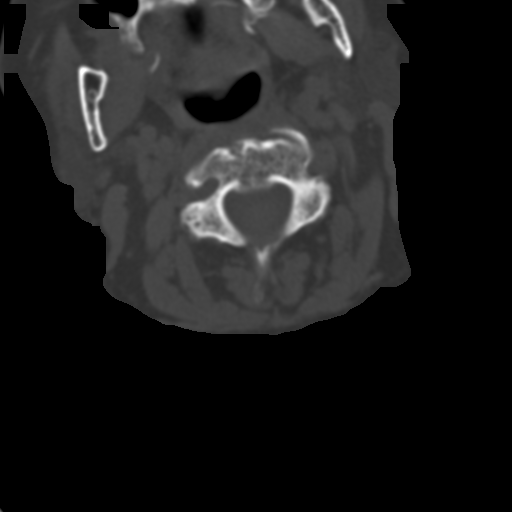
[im 60/87  soft-tissue]
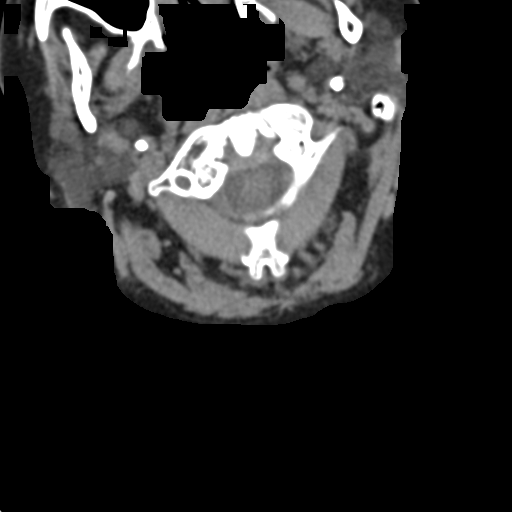
[im 60/87  bone]
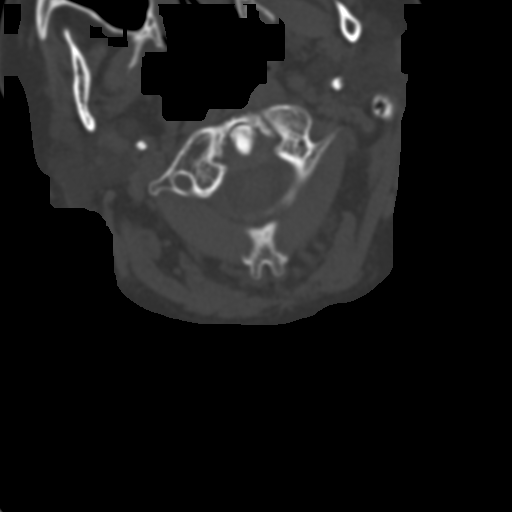
[im 67/87  bone]
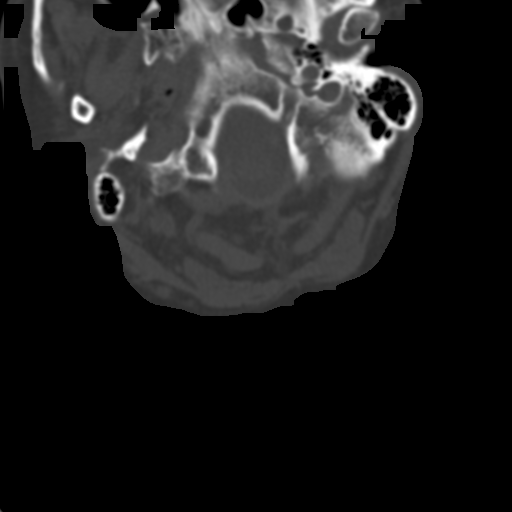
[im 73/87  bone]
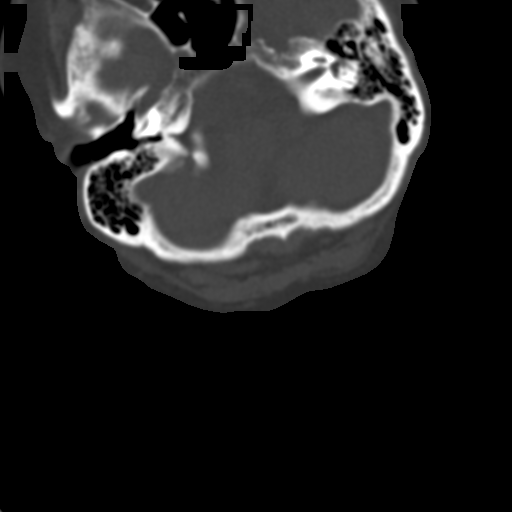
[im 80/87  bone]
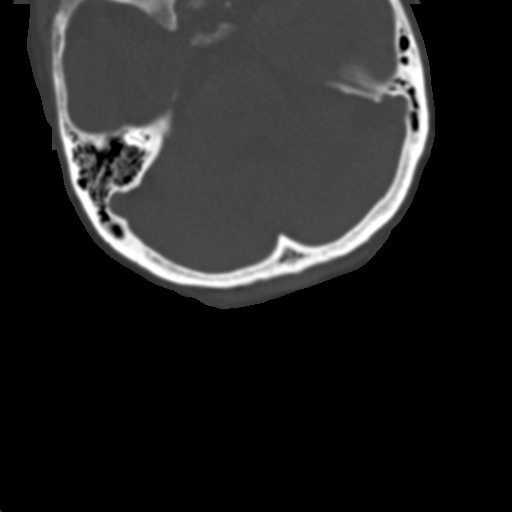

[12 of 14 positions shown; findings below may reference images not displayed]

FINDINGS: Alignment: Mild reversal of normal lordosis. No traumatic
subluxation.

Skull base and vertebrae: No acute fracture. Vertebral body heights
are maintained. The dens and skull base are intact. Degenerative
cystic change at C1-C2.

Soft tissues and spinal canal: No prevertebral fluid or swelling. No
visible canal hematoma.

Disc levels: Diffuse disc space narrowing and endplate spurring.
Diffuse facet hypertrophy.

Upper chest: Negative.

Other: None.
IMPRESSION: Multilevel degenerative change throughout the cervical spine without
acute fracture or subluxation.

## 2020-09-25 DIAGNOSIS — F3342 Major depressive disorder, recurrent, in full remission: Secondary | ICD-10-CM | POA: Diagnosis not present

## 2021-01-01 DIAGNOSIS — F3342 Major depressive disorder, recurrent, in full remission: Secondary | ICD-10-CM | POA: Diagnosis not present

## 2021-03-21 DIAGNOSIS — F3342 Major depressive disorder, recurrent, in full remission: Secondary | ICD-10-CM | POA: Diagnosis not present

## 2021-05-27 DIAGNOSIS — F3342 Major depressive disorder, recurrent, in full remission: Secondary | ICD-10-CM | POA: Diagnosis not present

## 2022-05-04 ENCOUNTER — Encounter: Payer: Self-pay | Admitting: Nurse Practitioner

## 2022-05-04 ENCOUNTER — Ambulatory Visit (INDEPENDENT_AMBULATORY_CARE_PROVIDER_SITE_OTHER): Payer: PPO | Admitting: Nurse Practitioner

## 2022-05-04 VITALS — BP 120/78 | HR 64 | Temp 97.3°F | Ht 59.0 in | Wt 133.6 lb

## 2022-05-04 DIAGNOSIS — K219 Gastro-esophageal reflux disease without esophagitis: Secondary | ICD-10-CM

## 2022-05-04 DIAGNOSIS — Z23 Encounter for immunization: Secondary | ICD-10-CM

## 2022-05-04 DIAGNOSIS — F5101 Primary insomnia: Secondary | ICD-10-CM | POA: Diagnosis not present

## 2022-05-04 DIAGNOSIS — R413 Other amnesia: Secondary | ICD-10-CM

## 2022-05-04 DIAGNOSIS — J302 Other seasonal allergic rhinitis: Secondary | ICD-10-CM

## 2022-05-04 DIAGNOSIS — M1991 Primary osteoarthritis, unspecified site: Secondary | ICD-10-CM | POA: Diagnosis not present

## 2022-05-04 DIAGNOSIS — R251 Tremor, unspecified: Secondary | ICD-10-CM

## 2022-05-04 LAB — CBC WITH DIFFERENTIAL/PLATELET
Basophils Absolute: 58 cells/uL (ref 0–200)
MPV: 9.5 fL (ref 7.5–12.5)
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
## 2022-05-05 LAB — COMPLETE METABOLIC PANEL WITH GFR
AG Ratio: 1.5 (calc) (ref 1.0–2.5)
ALT: 9 U/L (ref 6–29)
AST: 22 U/L (ref 10–35)
Albumin: 4.1 g/dL (ref 3.6–5.1)
Alkaline phosphatase (APISO): 54 U/L (ref 37–153)
BUN/Creatinine Ratio: 16 (calc) (ref 6–22)
BUN: 16 mg/dL (ref 7–25)
CO2: 22 mmol/L (ref 20–32)
Calcium: 9.6 mg/dL (ref 8.6–10.4)
Chloride: 106 mmol/L (ref 98–110)
Creat: 1.01 mg/dL — ABNORMAL HIGH (ref 0.60–0.95)
Globulin: 2.8 g/dL (calc) (ref 1.9–3.7)
Glucose, Bld: 82 mg/dL (ref 65–99)
Potassium: 4.7 mmol/L (ref 3.5–5.3)
Sodium: 142 mmol/L (ref 135–146)
Total Bilirubin: 0.6 mg/dL (ref 0.2–1.2)
Total Protein: 6.9 g/dL (ref 6.1–8.1)
eGFR: 55 mL/min/{1.73_m2} — ABNORMAL LOW (ref >=60)

## 2022-05-05 LAB — CBC WITH DIFFERENTIAL/PLATELET
Absolute Monocytes: 413 cells/uL (ref 200–950)
Basophils Relative: 1.1 %
Eosinophils Absolute: 191 cells/uL (ref 15–500)
Eosinophils Relative: 3.6 %
HCT: 40.1 % (ref 35.0–45.0)
Hemoglobin: 13.5 g/dL (ref 11.7–15.5)
Lymphs Abs: 1956 cells/uL (ref 850–3900)
MCH: 29.9 pg (ref 27.0–33.0)
MCHC: 33.7 g/dL (ref 32.0–36.0)
MCV: 88.9 fL (ref 80.0–100.0)
Monocytes Relative: 7.8 %
Neutro Abs: 2682 cells/uL (ref 1500–7800)
Neutrophils Relative %: 50.6 %
Platelets: 275 10*3/uL (ref 140–400)
RBC: 4.51 10*6/uL (ref 3.80–5.10)
RDW: 13.7 % (ref 11.0–15.0)
Total Lymphocyte: 36.9 %
WBC: 5.3 10*3/uL (ref 3.8–10.8)

## 2022-05-05 LAB — RPR: RPR Ser Ql: NONREACTIVE

## 2022-05-05 LAB — TSH: TSH: 2.53 mIU/L (ref 0.40–4.50)

## 2022-05-05 LAB — VITAMIN B12: Vitamin B-12: 294 pg/mL (ref 200–1100)

## 2022-05-15 ENCOUNTER — Other Ambulatory Visit: Payer: PPO

## 2022-05-21 NOTE — Addendum Note (Signed)
Addended by: Lauree Chandler on: 05/21/2022 02:07 PM   Modules accepted: Level of Service

## 2022-05-22 ENCOUNTER — Ambulatory Visit: Payer: PPO | Admitting: Physician Assistant

## 2022-05-27 ENCOUNTER — Ambulatory Visit (INDEPENDENT_AMBULATORY_CARE_PROVIDER_SITE_OTHER): Payer: PPO | Admitting: Physician Assistant

## 2022-05-27 ENCOUNTER — Encounter: Payer: Self-pay | Admitting: Physician Assistant

## 2022-05-27 VITALS — BP 102/68 | HR 99 | Resp 18 | Wt 133.0 lb

## 2022-05-27 DIAGNOSIS — R29818 Other symptoms and signs involving the nervous system: Secondary | ICD-10-CM | POA: Diagnosis not present

## 2022-05-27 DIAGNOSIS — R413 Other amnesia: Secondary | ICD-10-CM
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
## 2022-05-27 NOTE — Patient Instructions (Addendum)
It was a pleasure to see you today at our office.   Recommendations:   MRI of the brain, the radiology office will call you to arrange you appointment Follow up in jan 4 at 1 pm   Whom to call:  Memory  decline, memory medications: Call our office [phone]   For psychiatric meds, mood meds: Please have your primary care physician manage these medications.   Counseling regarding caregiver distress, including caregiver depression, anxiety and issues regarding community resources, adult day care programs, adult living facilities, or memory care questions:   Feel free to contact [place], Social Worker at [phone]   For assessment of decision of mental capacity and competency:  Call Dr. Anthoney Harada, geriatric psychiatrist at [phone]  For guidance in geriatric dementia issues please call [facility] [phone]  For guidance regarding [facility] and if placement were needed at the facility, contact Arnell Asal, Social Worker tel: [phone]  If you have any severe symptoms of a stroke, or other severe issues such as confusion,severe chills or fever, etc call 911 or go to the ER as you may need to be evaluated further   Feel free to visit Facebook page " [employer]" for tips of how to care for people with memory problems.   Feel free to go to the following database for funded clinical studies conducted around the world: http://saunders.com/   https://www.triadclinicaltrials.com/     RECOMMENDATIONS FOR ALL PATIENTS WITH MEMORY PROBLEMS: 1. Continue to exercise (Recommend 30 minutes of walking everyday, or 3 hours every week) 2. Increase social interactions - continue going to [place] and enjoy social gatherings with friends and family 3. Eat healthy, avoid fried foods and eat more fruits and vegetables 4. Maintain adequate blood pressure, blood sugar, and blood cholesterol level. Reducing the risk of stroke and  cardiovascular disease also helps promoting better memory. 5. Avoid stressful situations. Live a simple life and avoid aggravations. Organize your time and prepare for the next day in anticipation. 6. Sleep well, avoid any interruptions of sleep and avoid any distractions in the bedroom that may interfere with adequate sleep quality 7. Avoid sugar, avoid sweets as there is a strong link between excessive sugar intake, diabetes, and cognitive impairment We discussed the Mediterranean diet, which has been shown to help patients reduce the risk of progressive memory disorders and reduces cardiovascular risk. This includes eating fish, eat fruits and [place] leafy vegetables, nuts like almonds and hazelnuts, walnuts, and also use olive oil. Avoid fast foods and fried foods as much as possible. Avoid sweets and sugar as sugar use has been linked to worsening of memory function.  There is always a concern of gradual progression of memory problems. If this is the case, then we may need to adjust level of care according to patient needs. Support, both to the patient and caregiver, should then be put into place.      You have been referred for a neuropsychological evaluation (i.e., evaluation of memory and thinking abilities). Please bring someone with you to this appointment if possible, as it is helpful for the doctor to hear from both you and another adult who knows you well. Please bring eyeglasses and hearing aids if you wear them.    The evaluation will take approximately 3 hours and has two parts:   The first part is a clinical interview with the neuropsychologist (Dr. Melvyn Novas or Dr. Nicole Kindred). During the interview, the neuropsychologist will speak with you and the  individual you brought to the appointment.    The second part of the evaluation is testing with the doctor's technician Hinton Dyer or Maudie Mercury). During the testing, the technician will ask you to remember different types of material, solve problems, and  answer some questionnaires. Your family member will not be present for this portion of the evaluation.   Please note: We must reserve several hours of the neuropsychologist's time and the psychometrician's time for your evaluation appointment. As such, there is a No-Show fee of $100. If you are unable to attend any of your appointments, please contact our office as soon as possible to reschedule.    FALL PRECAUTIONS: Be cautious when walking. Scan the area for obstacles that may increase the risk of trips and falls. When getting up in the mornings, sit up at the edge of the bed for a few minutes before getting out of bed. Consider elevating the bed at the head end to avoid drop of blood pressure when getting up. Walk always in a well-lit room (use night lights in the walls). Avoid area rugs or power cords from appliances in the middle of the walkways. Use a walker or a cane if necessary and consider physical therapy for balance exercise. Get your eyesight checked regularly.  FINANCIAL OVERSIGHT: Supervision, especially oversight when making financial decisions or transactions is also recommended.  HOME SAFETY: Consider the safety of the kitchen when operating appliances like stoves, microwave oven, and blender. Consider having supervision and share cooking responsibilities until no longer able to participate in those. Accidents with firearms and other hazards in the house should be identified and addressed as well.   ABILITY TO BE LEFT ALONE: If patient is unable to contact 911 operator, consider using LifeLine, or when the need is there, arrange for someone to stay with patients. Smoking is a fire hazard, consider supervision or cessation. Risk of wandering should be assessed by caregiver and if detected at any point, supervision and safe proof recommendations should be instituted.  MEDICATION SUPERVISION: Inability to self-administer medication needs to be constantly addressed. Implement a mechanism  to ensure safe administration of the medications.   DRIVING: Regarding driving, in patients with progressive memory problems, driving will be impaired. We advise to have someone else do the driving if trouble finding directions or if minor accidents are reported. Independent driving assessment is available to determine safety of driving.   If you are interested in the driving assessment, you can contact the following:  [employer] in [place]  [place] [phone] or [phone]    [place] refers to food and lifestyle choices that are based on the traditions of countries located on the [employer]. This way of eating has been shown to help prevent certain conditions and improve outcomes for people who have chronic diseases, like kidney disease and heart disease. What are tips for following this plan? Lifestyle  Cook and eat meals together with your family, when possible. Drink enough fluid to keep your urine clear or pale yellow. Be physically active every day. This includes: Aerobic exercise like running or swimming. Leisure activities like gardening, walking, or housework. Get 7-8 hours of sleep each night. If recommended by your health care provider, drink red wine in moderation. This means 1 glass a day for nonpregnant women and 2 glasses a day for men. A glass of wine equals 5 oz (150 mL). Reading food labels  Check the serving size of packaged foods. For foods such as rice and pasta, the serving size refers to the amount of cooked product, not dry. Check the total fat in packaged foods. Avoid foods that have saturated fat or trans fats. Check the ingredients list for added sugars, such as corn syrup. Shopping  At the grocery store, buy most of your food from the areas near the walls of the store. This includes: Fresh  fruits and vegetables (produce). Grains, beans, nuts, and seeds. Some of these may be available in unpackaged forms or large amounts (in bulk). Fresh seafood. Poultry and eggs. Low-fat dairy products. Buy whole ingredients instead of prepackaged foods. Buy fresh fruits and vegetables in-season from local farmers markets. Buy frozen fruits and vegetables in resealable bags. If you do not have access to quality fresh seafood, buy precooked frozen shrimp or canned fish, such as tuna, salmon, or sardines. Buy small amounts of raw or cooked vegetables, salads, or olives from the deli or salad bar at your store. Stock your pantry so you always have certain foods on hand, such as olive oil, canned tuna, canned tomatoes, rice, pasta, and beans. Cooking  Cook foods with extra-virgin olive oil instead of using butter or other vegetable oils. Have meat as a side dish, and have vegetables or grains as your main dish. This means having meat in small portions or adding small amounts of meat to foods like pasta or stew. Use beans or vegetables instead of meat in common dishes like chili or lasagna. Experiment with different cooking methods. Try roasting or broiling vegetables instead of steaming or sauteing them. Add frozen vegetables to soups, stews, pasta, or rice. Add nuts or seeds for added healthy fat at each meal. You can add these to yogurt, salads, or vegetable dishes. Marinate fish or vegetables using olive oil, lemon juice, garlic, and fresh herbs. Meal planning  Plan to eat 1 vegetarian meal one day each week. Try to work up to 2 vegetarian meals, if possible. Eat seafood 2 or more times a week. Have healthy snacks readily available, such as: Vegetable sticks with hummus. Greek yogurt. Fruit and nut trail mix. Eat balanced meals throughout the week. This includes: Fruit: 2-3 servings a day Vegetables: 4-5 servings a day Low-fat dairy: 2 servings a day Fish, poultry, or lean meat: 1 serving  a day Beans and legumes: 2 or more servings a week Nuts and seeds: 1-2 servings a day Whole grains: 6-8 servings a day Extra-virgin olive oil: 3-4 servings a day Limit red meat and sweets to only a few servings a month What are my food choices? Mediterranean diet Recommended Grains: Whole-grain pasta. Brown rice. Bulgar wheat. Polenta. Couscous. Whole-wheat bread. [place]. Vegetables: Artichokes. Beets. Broccoli. Cabbage. Carrots. Eggplant. [place] beans. Chard. Kale. Spinach. Onions. Leeks. Peas. Squash. Tomatoes. Peppers. Radishes. Fruits: Apples. Apricots. Avocado. Berries. Bananas. Cherries. Dates. Figs. Grapes. Lemons. Melon. Oranges. Peaches. Plums. Pomegranate. Meats and other protein foods: Beans. Almonds. Sunflower seeds. Pine nuts. Peanuts. [place]. Salmon. Scallops. Shrimp. [place]. Tilapia. Clams. Oysters. Eggs. Dairy: Low-fat milk. Cheese. Greek yogurt. Beverages: Water. Red wine. Herbal tea. Fats and oils: Extra virgin olive oil. Avocado oil. Grape seed oil. Sweets and desserts: [place] yogurt with honey. Baked apples. Poached pears. Trail mix. Seasoning and other foods: Basil. Cilantro. Coriander. Cumin. Mint. Parsley. Sage. Rosemary. Tarragon. Garlic. Oregano. Thyme. Pepper. Balsalmic vinegar. Tahini. Hummus. Tomato sauce. Olives. Mushrooms. Limit these Grains: Prepackaged pasta or rice dishes. Prepackaged cereal with added sugar. Vegetables: Deep  fried potatoes (french fries). Fruits: Fruit canned in syrup. Meats and other protein foods: Beef. Pork. Lamb. Poultry with skin. Hot dogs. [place] Salines. Dairy: Ice cream. Sour cream. Whole milk. Beverages: Juice. Sugar-sweetened soft drinks. Beer. Liquor and spirits. Fats and oils: Butter. Canola oil. Vegetable oil. Beef fat (tallow). Lard. Sweets and desserts: Cookies. Cakes. Pies. Candy. Seasoning and other foods: Mayonnaise. Premade sauces and marinades. The items listed may not be a complete list. Talk with your dietitian about what  dietary choices are right for you. Summary The Mediterranean diet includes both food and lifestyle choices. Eat a variety of fresh fruits and vegetables, beans, nuts, seeds, and whole grains. Limit the amount of red meat and sweets that you eat. Talk with your health care provider about whether it is safe for you to drink red wine in moderation. This means 1 glass a day for nonpregnant women and 2 glasses a day for men. A glass of wine equals 5 oz (150 mL). This information is not intended to replace advice given to you by your health care provider. Make sure you discuss any questions you have with your health care provider. Document Released: 02/13/2016 Document Revised: 03/17/2016 Document Reviewed: 02/13/2016 Elsevier Interactive Patient Education  2017 [employer].   .cre

## 2022-06-15 ENCOUNTER — Other Ambulatory Visit: Payer: PPO

## 2022-06-23 ENCOUNTER — Ambulatory Visit
Admission: RE | Admit: 2022-06-23 | Discharge: 2022-06-23 | Disposition: A | Discharge status: Home / Self Care | Payer: PPO | Source: Ambulatory Visit | Attending: Physician Assistant | Admitting: Physician Assistant

## 2022-06-25 NOTE — Progress Notes (Signed)
MRI brain shows some atrophy and mild chronic changes of the vessels of the brain . No acute findings. Thanks

## 2022-07-02 ENCOUNTER — Encounter: Payer: Self-pay | Admitting: Nurse Practitioner

## 2022-07-02 NOTE — Progress Notes (Signed)
   This service is provided via telemedicine  No vital signs collected/recorded due to the encounter was a telemedicine visit.   Location of patient (ex: home, work):  Home  Patient consents to a telephone visit: Yes, see telephone visit dated 07/03/2022  Location of the provider (ex: office, home):  [facility], Office   Name of any referring provider:  N/A  Names of all persons participating in the telemedicine service and their role in the encounter:  S.Chrae B/CMA, Sherrie Mustache, NP, and Patient   Time spent on call:  9 min with medical assistant

## 2022-07-03 ENCOUNTER — Encounter: Payer: PPO | Admitting: Nurse Practitioner

## 2022-07-03 ENCOUNTER — Encounter: Payer: Self-pay | Admitting: Nurse Practitioner

## 2022-07-07 NOTE — Progress Notes (Signed)
This encounter was created in error - please disregard.

## 2022-07-09 ENCOUNTER — Telehealth: Payer: Self-pay | Admitting: Anesthesiology

## 2022-07-09 ENCOUNTER — Ambulatory Visit: Payer: PPO | Admitting: Physician Assistant

## 2022-07-09 NOTE — Telephone Encounter (Signed)
Pt's son Ancil Boozer called stating he would like to speak to Clarise Cruz about pt's health condition. He was not able to get her out of the house today 07/09/2022 for her visit at 1:00 pm.
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
## 2022-07-14 ENCOUNTER — Ambulatory Visit: Payer: PPO | Admitting: Neurology

## 2022-08-04 ENCOUNTER — Emergency Department (HOSPITAL_COMMUNITY): Payer: PPO

## 2022-08-04 ENCOUNTER — Other Ambulatory Visit: Payer: Self-pay

## 2022-08-04 ENCOUNTER — Emergency Department (HOSPITAL_COMMUNITY)
Admission: EM | Admit: 2022-08-04 | Discharge: 2022-08-08 | Disposition: A | Discharge status: Home / Self Care | Payer: PPO | Attending: Emergency Medicine | Admitting: Emergency Medicine

## 2022-08-04 DIAGNOSIS — F03918 Unspecified dementia, unspecified severity, with other behavioral disturbance: Secondary | ICD-10-CM

## 2022-08-04 DIAGNOSIS — Z1152 Encounter for screening for COVID-19: Secondary | ICD-10-CM | POA: Diagnosis not present

## 2022-08-04 DIAGNOSIS — R946 Abnormal results of thyroid function studies: Secondary | ICD-10-CM

## 2022-08-04 DIAGNOSIS — F03911 Unspecified dementia, unspecified severity, with agitation: Secondary | ICD-10-CM | POA: Diagnosis not present

## 2022-08-04 DIAGNOSIS — R4689 Other symptoms and signs involving appearance and behavior: Secondary | ICD-10-CM | POA: Diagnosis present

## 2022-08-04 DIAGNOSIS — Z79899 Other long term (current) drug therapy: Secondary | ICD-10-CM | POA: Diagnosis not present

## 2022-08-04 DIAGNOSIS — R4182 Altered mental status, unspecified: Secondary | ICD-10-CM | POA: Diagnosis not present

## 2022-08-04 DIAGNOSIS — R251 Tremor, unspecified: Secondary | ICD-10-CM | POA: Insufficient documentation

## 2022-08-04 MED ORDER — LORAZEPAM 1 MG PO TABS
1.0000 mg | ORAL_TABLET | Freq: Once | ORAL | Status: AC
  Administered 2022-08-04: 1 mg via ORAL
  Filled 2022-08-04: qty 1

## 2022-08-04 NOTE — ED Provider Notes (Signed)
[place] EMERGENCY DEPARTMENT AT [facility] Provider Note   CSN: [id number] Arrival date & time: 08/04/22  2032     History  Chief Complaint  Patient presents with   Aggressive Behavior    Courtney Avila is a 82 y.o. female.  HPI   82 year old female with medical history significant for dementia with behavioral disturbance, follows outpatient with neurology, recent MRI imaging which showed cerebral atrophy who presents to the emergency department with behavioral disturbance in the home and alteration from her baseline mental status.  The history is per primarily provided by the patient's son.  Per her son she was up at 5 am, worsening aggressive behavior, altered from her baseline. Baseline: Dementia sx for around 20 years, thought people were knocking on her windows at night, saw [facility], has seen an outpatient neurologist. MRI of the brain performed, result hadn't been communicated yet. Lots of issues with hallucinations. Lives with her son. Requesting a case worker because cannot manage her dementia anymore.   Today she has been more agitated compared to her baseline, yelling, hallucinating in front of family members, thinking people were coming through walls. No infectious symptoms, no fevers, cough, complaints of pain in the last few days.  Home Medications Prior to Admission medications   Medication Sig Start Date End Date Taking? Authorizing Provider  busPIRone (BUSPAR) 10 MG tablet Take 20 mg by mouth at bedtime.    [provider]  Doxylamine Succinate, Sleep, (SLEEP AID PO) Take by mouth.    [provider]  fluticasone (FLONASE) 50 MCG/ACT nasal spray Place 1 spray into both nostrils daily as needed for allergies or rhinitis.    [provider]  Multiple Vitamins-Minerals (HAIR SKIN AND NAILS FORMULA PO) Take 1 tablet by mouth daily.    [provider]  PARoxetine (PAXIL) 40 MG tablet Take 40 mg by mouth daily.     [provider]  prazosin (MINIPRESS) 1 MG capsule Take 2 mg by mouth at bedtime.    [provider]  temazepam (RESTORIL) 30 MG capsule Take 30 mg by mouth at bedtime.    [provider]      Allergies    Patient has no known allergies.    Review of Systems   Review of Systems  Unable to perform ROS: Dementia    Physical Exam Updated Vital Signs BP (!) 147/89   Pulse (!) 105   Temp 98 F (36.7 C) (Oral)   Resp 18   Ht '4\' 11"'$  (1.499 m)   Wt 60 kg   SpO2 100%   BMI 26.72 kg/m  Physical Exam Vitals and nursing note reviewed.  Constitutional:      General: She is not in acute distress.    Comments: Frail appearing, no distress, GCS 14, confused  HENT:     Head: Normocephalic and atraumatic.  Eyes:     Conjunctiva/sclera: Conjunctivae normal.     Pupils: Pupils are equal, round, and reactive to light.  Cardiovascular:     Rate and Rhythm: Normal rate and regular rhythm.     Heart sounds: Normal heart sounds.  Pulmonary:     Effort: Pulmonary effort is normal. No respiratory distress.     Breath sounds: Normal breath sounds.  Abdominal:     General: There is no distension.     Tenderness: There is no abdominal tenderness. There is no guarding.  Musculoskeletal:        General: No deformity or  signs of injury.     Cervical back: Neck supple.  Skin:    Findings: No lesion or rash.  Neurological:     General: No focal deficit present.     Mental Status: She is alert. Mental status is at baseline.  Psychiatric:        Attention and Perception: She perceives visual hallucinations.        Speech: Speech normal.        Behavior: Behavior is agitated.     Comments: Withdrawn, cooperative, occasional verbal outbursts     ED Results / Procedures / Treatments   Labs (all labs ordered are listed, but only abnormal results are displayed) Labs Reviewed  RESP PANEL BY RT-PCR (RSV, FLU A&B, COVID)  [id number]  COMPREHENSIVE METABOLIC PANEL  ETHANOL   RAPID URINE DRUG SCREEN, HOSP PERFORMED  CBC WITH DIFFERENTIAL/PLATELET  URINALYSIS, ROUTINE W REFLEX MICROSCOPIC  TSH  T4, FREE  CBG MONITORING, ED    EKG None  Radiology No results found.  Procedures Procedures    Medications Ordered in ED Medications  LORazepam (ATIVAN) tablet 1 mg (has no administration in time range)    ED Course/ Medical Decision Making/ A&P                             Medical Decision Making Amount and/or Complexity of Data Reviewed Labs: ordered.  Risk Prescription drug management.    82 year old female with medical history significant for dementia with behavioral disturbance, follows outpatient with neurology, recent MRI imaging which showed cerebral atrophy who presents to the emergency department with behavioral disturbance in the home and alteration from her baseline mental status.  The history is per primarily provided by the patient's son.  Per her son she was up at 5 am, worsening aggressive behavior, altered from her baseline. Baseline: Dementia sx for around 20 years, thought people were knocking on her windows at night, saw [facility], has seen an outpatient neurologist. MRI of the brain performed, result hadn't been communicated yet. Lots of issues with hallucinations. Lives with her son. Requesting a case worker because cannot manage her dementia anymore.   Today she has been more agitated compared to her baseline, yelling, hallucinating in front of family members, thinking people were coming through walls. No infectious symptoms, no fevers, cough, complaints of pain in the last few days.  On arrival, the patient was afebrile, temperature 98, mildly tachycardic heart rate 105 in the setting of agitation, BP 147/89, RR 18, saturating 100% on room air.  Patient with a nonfocal neurologic exam, presenting with worsening agitation and change in mental status in the setting of known diagnosis of dementia with behavioral  disturbance.  Given the patient's change from baseline, will initiate workup to evaluate for altered mental status which could be due to electrolyte disturbance, infectious etiology such as urinary tract infection or pneumonia, will obtain CT imaging of the head.  Patient did undergo MRI imaging of the brain outpatient which showed cerebral atrophy and chronic microvascular ischemic change.  She was mildly agitated and 1 mg of Ativan was administered on arrival.  [facility] consult placed as failed number states they are having increasing difficulty managing and caring for the patient in the home.  Signout given to Dr. Florina Ou at 0000 pending initial workup to evaluate for organic etiology of the patient's change in mental status.   Final Clinical Impression(s) / ED Diagnoses Final diagnoses:  Dementia with  behavioral disturbance [facility])    Rx / DC Orders ED Discharge Orders     None         Regan Lemming, MD 08/04/22 2329
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
## 2022-08-05 LAB — CBC WITH DIFFERENTIAL/PLATELET
Abs Immature Granulocytes: 0.03 10*3/uL (ref 0.00–0.07)
Basophils Absolute: 0 10*3/uL (ref 0.0–0.1)
Basophils Relative: 0 %
Eosinophils Absolute: 0.1 10*3/uL (ref 0.0–0.5)
Eosinophils Relative: 1 %
HCT: 35.2 % — ABNORMAL LOW (ref 36.0–46.0)
Hemoglobin: 11.9 g/dL — ABNORMAL LOW (ref 12.0–15.0)
Immature Granulocytes: 0 %
Lymphocytes Relative: 36 %
Lymphs Abs: 3.4 10*3/uL (ref 0.7–4.0)
MCH: 30 pg (ref 26.0–34.0)
MCHC: 33.8 g/dL (ref 30.0–36.0)
MCV: 88.7 fL (ref 80.0–100.0)
Monocytes Absolute: 1.2 10*3/uL — ABNORMAL HIGH (ref 0.1–1.0)
Monocytes Relative: 13 %
Neutro Abs: 4.7 10*3/uL (ref 1.7–7.7)
Neutrophils Relative %: 50 %
Platelets: 277 10*3/uL (ref 150–400)
RBC: 3.97 MIL/uL (ref 3.87–5.11)
RDW: 13.8 % (ref 11.5–15.5)
WBC: 9.4 10*3/uL (ref 4.0–10.5)
nRBC: 0 % (ref 0.0–0.2)

## 2022-08-05 LAB — URINALYSIS, ROUTINE W REFLEX MICROSCOPIC
Bacteria, UA: NONE SEEN
Bilirubin Urine: NEGATIVE
Glucose, UA: NEGATIVE mg/dL
Hgb urine dipstick: NEGATIVE
Ketones, ur: 5 mg/dL — AB
Leukocytes,Ua: NEGATIVE
Nitrite: NEGATIVE
Protein, ur: 30 mg/dL — AB
Specific Gravity, Urine: 1.02 (ref 1.005–1.030)
pH: 8 (ref 5.0–8.0)

## 2022-08-05 LAB — RAPID URINE DRUG SCREEN, HOSP PERFORMED
Amphetamines: NOT DETECTED
Barbiturates: NOT DETECTED
Benzodiazepines: POSITIVE — AB
Cocaine: NOT DETECTED
Opiates: NOT DETECTED
Tetrahydrocannabinol: NOT DETECTED

## 2022-08-05 LAB — COMPREHENSIVE METABOLIC PANEL
ALT: 14 U/L (ref 0–44)
AST: 37 U/L (ref 15–41)
Albumin: 3.8 g/dL (ref 3.5–5.0)
Alkaline Phosphatase: 48 U/L (ref 38–126)
Anion gap: 12 (ref 5–15)
BUN: 25 mg/dL — ABNORMAL HIGH (ref 8–23)
CO2: 18 mmol/L — ABNORMAL LOW (ref 22–32)
Calcium: 9.5 mg/dL (ref 8.9–10.3)
Chloride: 110 mmol/L (ref 98–111)
Creatinine, Ser: 1.35 mg/dL — ABNORMAL HIGH (ref 0.44–1.00)
GFR, Estimated: 39 mL/min — ABNORMAL LOW (ref >60)
Glucose, Bld: 99 mg/dL (ref 70–99)
Potassium: 3.5 mmol/L (ref 3.5–5.1)
Sodium: 140 mmol/L (ref 135–145)
Total Bilirubin: 0.9 mg/dL (ref 0.3–1.2)
Total Protein: 7 g/dL (ref 6.5–8.1)

## 2022-08-05 LAB — T4, FREE: Free T4: 1.41 ng/dL — ABNORMAL HIGH (ref 0.61–1.12)

## 2022-08-05 LAB — ETHANOL: Alcohol, Ethyl (B): <10 mg/dL (ref <10)

## 2022-08-05 LAB — TSH: TSH: 5.745 u[IU]/mL — ABNORMAL HIGH (ref 0.350–4.500)

## 2022-08-05 LAB — RESP PANEL BY RT-PCR (RSV, FLU A&B, COVID)  RVPGX2
Influenza A by PCR: NEGATIVE
Influenza B by PCR: NEGATIVE
Resp Syncytial Virus by PCR: NEGATIVE
SARS Coronavirus 2 by RT PCR: NEGATIVE

## 2022-08-05 NOTE — Progress Notes (Signed)
CSW spoke to the patient's son at this time CSW made the son aware that [facility] did accept his mom and that the admissions was taking a second look into the patient and would let TOC know. TOC will follow up with the son when we are updated.

## 2022-08-05 NOTE — NC FL2 (Signed)
  [place] MEDICAID FL2 LEVEL OF CARE FORM     IDENTIFICATION  Patient Name: Courtney Avila Birthdate: March 30, 1941 Sex: female Admission Date (Current Location): 08/04/2022  [facility] and [place] Number:  Herbalist and Address:  [facility],  [place], [place]      Provider Number: [id number]  Attending Physician Name and Address:  Default, Provider, MD  Relative Name and Phone Number:  Ancil [phone]    Current Level of Care: Hospital Recommended Level of Care: [place] Prior Approval Number:    Date Approved/Denied:   PASRR Number: [id number]  Discharge Plan: SNF    Current Diagnoses: Patient Active Problem List   Diagnosis Date Noted   OA (osteoarthritis) of knee 12/07/2016   S/P reverse total shoulder arthroplasty, 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    Orientation RESPIRATION BLADDER Height & Weight     Self  Normal Incontinent Weight: 132 lb 4.4 oz (60 kg) Height:  '4\' 11"'$  (149.9 cm)  BEHAVIORAL SYMPTOMS/MOOD NEUROLOGICAL BOWEL NUTRITION STATUS      Incontinent Diet (Regular)  AMBULATORY STATUS COMMUNICATION OF NEEDS Skin     Verbally                         Personal Care Assistance Level of Assistance  Bathing, Feeding, Dressing Bathing Assistance: Maximum assistance Feeding assistance: Limited assistance Dressing Assistance: Maximum assistance     Functional Limitations Info  Sight, Hearing, Speech Sight Info: Adequate Hearing Info: Adequate Speech Info: Adequate    SPECIAL CARE FACTORS FREQUENCY  PT (By licensed PT), OT (By licensed OT)     PT Frequency: x5/week OT Frequency: x5/week            Contractures Contractures Info: Not present    Additional Factors Info  Code Status, Allergies, Psychotropic Code Status Info: Full Allergies Info: None Psychotropic Info: None         Current Medications (08/05/2022):  This is the current hospital active medication list No current  facility-administered medications for this encounter.   Current Outpatient Medications  Medication Sig Dispense Refill   busPIRone (BUSPAR) 10 MG tablet Take 20 mg by mouth at bedtime.     Doxylamine Succinate, Sleep, (SLEEP AID PO) Take by mouth.     fluticasone (FLONASE) 50 MCG/ACT nasal spray Place 1 spray into both nostrils daily as needed for allergies or rhinitis.     Multiple Vitamins-Minerals (HAIR SKIN AND NAILS FORMULA PO) Take 1 tablet by mouth daily.     PARoxetine (PAXIL) 40 MG tablet Take 40 mg by mouth daily.     prazosin (MINIPRESS) 1 MG capsule Take 2 mg by mouth at bedtime.     temazepam (RESTORIL) 30 MG capsule Take 30 mg by mouth at bedtime.       Discharge Medications: Please see discharge summary for a list of discharge medications.  Relevant Imaging Results:  Relevant Lab Results:   Additional Information SSN: [id number]  Kimber Relic, LCSW

## 2022-08-05 NOTE — Progress Notes (Signed)
[id number]

## 2022-08-05 NOTE — Progress Notes (Signed)
At this time the patient only has one bed offer, [facility]. This CSW reached out to [facility] to see if the patient can come there. [facility] stated she will review and get back to me. This CSW also called the son twice with no answer. TOC will continue to follow.

## 2022-08-05 NOTE — Progress Notes (Addendum)
[facility] from [facility] has declined the patient due to behaviors. [facility] also accepted in the hub this CSW reached out to [place] at [facility] [facility] through phone and text. AT this time we do not have a response from [place] at this time [facility] will continue to follow this patient.    Addend'@7'$ :21  This CSW spoke to the patient's son CSW explained that at this time [facility] could not extend a bed offer, however [facility] may have a bed as well. CSW explained that the AM CSW will follow up with him however that he should look into other plans in the case that his mom is not able to go to a facility. TOC will continue to follow

## 2022-08-05 NOTE — Progress Notes (Signed)
Awaiting PT.

## 2022-08-05 NOTE — Evaluation (Signed)
Physical Therapy Evaluation Patient Details Name: Courtney Avila MRN: [id number] DOB: 08/22/1940 Today's Date: 08/05/2022  History of Present Illness  82 year old female with medical history significant for dementia with behavioral disturbance, follows outpatient with neurology, recent MRI imaging which showed cerebral atrophy who presents to the emergency department with behavioral disturbance in the home and alteration from her baseline mental status.  Clinical Impression  Pt admitted with above diagnosis.  Pt currently with functional limitations due to the deficits listed below (see PT Problem List). Pt will benefit from skilled PT to increase their independence and safety with mobility to allow discharge to the venue listed below.     The patient is pleasantly confused. Patient able to follow  simple directions. Patient able to stand  with mod assistance. Patient should be able to progress to ambulate  with assistance next visit . Patient on high stretcher and patient short in stature.       Recommendations for follow up therapy are one component of a multi-disciplinary discharge planning process, led by the attending physician.  Recommendations may be updated based on patient status, additional functional criteria and insurance authorization.  Follow Up Recommendations Skilled nursing-short term rehab (<3 hours/day) Can patient physically be transported by private vehicle: Yes    Assistance Recommended at Discharge Frequent or constant Supervision/Assistance  Patient can return home with the following  Assistance with cooking/housework;A lot of help with bathing/dressing/bathroom;A lot of help with walking and/or transfers;Assist for transportation;Help with stairs or ramp for entrance    Equipment Recommendations None recommended by PT  Recommendations for Other Services       Functional Status Assessment Patient has had a recent decline in their functional status and  demonstrates the ability to make significant improvements in function in a reasonable and predictable amount of time.     Precautions / Restrictions Precautions Precautions: Fall      Mobility  Bed Mobility Overal bed mobility: Needs Assistance Bed Mobility: Supine to Sit, Sit to Supine     Supine to sit: Min assist Sit to supine: Min assist   General bed mobility comments: assist with trunk    Transfers Overall transfer level: Needs assistance   Transfers: Sit to/from Stand Sit to Stand: Mod assist           General transfer comment: stood on stool , holding to rail, and back of chair. Stretcher is high for patient stature    Ambulation/Gait                  Financial trader Rankin (Stroke Patients Only)       Balance Overall balance assessment: Needs assistance Sitting-balance support: Bilateral upper extremity supported, Feet supported Sitting balance-Leahy Scale: Fair     Standing balance support: Bilateral upper extremity supported, Reliant on assistive device for balance, During functional activity Standing balance-Leahy Scale: Poor                               Pertinent Vitals/Pain Pain Assessment Pain Assessment: No/denies pain    Home Living Family/patient expects to be discharged to:: Private residence Living Arrangements: Children Available Help at Discharge: Family;Available PRN/intermittently Type of Home: House Home Access: Level entry         Home Equipment: Rolling Walker (2 wheels);Toilet riser;Shower seat Additional Comments: per chart, lives with son  Prior Function               Mobility Comments: unsure,  notes elude that she was ambulatory in the home and lives with her son.       Hand Dominance   Dominant Hand: Right    Extremity/Trunk Assessment   Upper Extremity Assessment Upper Extremity Assessment:  (moderate tremors in the  arms and hands,  difficulty holding a cup)    Lower Extremity Assessment Lower Extremity Assessment: Generalized weakness    Cervical / Trunk Assessment Cervical / Trunk Assessment: Kyphotic  Communication   Communication: No difficulties  Cognition Arousal/Alertness: Awake/alert Behavior During Therapy: WFL for tasks assessed/performed Overall Cognitive Status: No family/caregiver present to determine baseline cognitive functioning Area of Impairment: Orientation, Memory                 Orientation Level: Place, Time, Situation   Memory: Decreased short-term memory         General Comments: foloows one step directions well        General Comments      Exercises     Assessment/Plan    PT Assessment Patient needs continued PT services  PT Problem List Decreased strength;Decreased balance;Decreased cognition;Decreased knowledge of precautions;Decreased activity tolerance;Decreased knowledge of use of DME       PT Treatment Interventions DME instruction;Functional mobility training;Balance training;Patient/family education;Gait training;Therapeutic exercise    PT Goals (Current goals can be found in the Care Plan section)  Acute Rehab PT Goals PT Goal Formulation: Patient unable to participate in goal setting Time For Goal Achievement: 08/19/22 Potential to Achieve Goals: Fair    Frequency Min 2X/week     Co-evaluation               AM-PAC PT "6 Clicks" Mobility  Outcome Measure Help needed turning from your back to your side while in a flat bed without using bedrails?: A Lot Help needed moving from lying on your back to sitting on the side of a flat bed without using bedrails?: A Lot Help needed moving to and from a bed to a chair (including a wheelchair)?: A Lot Help needed standing up from a chair using your arms (e.g., wheelchair or bedside chair)?: A Lot Help needed to walk in hospital room?: Total Help needed climbing 3-5 steps with a railing? : Total 6  Click Score: 10    End of Session Equipment Utilized During Treatment: Gait belt Activity Tolerance: Patient tolerated treatment well Patient left: in bed;with call bell/phone within reach Nurse Communication: Mobility status PT Visit Diagnosis: Unsteadiness on feet (R26.81)    Time: 7782-4235 PT Time Calculation (min) (ACUTE ONLY): 27 min   Charges:   PT Evaluation $PT Eval Low Complexity: 1 Low PT Treatments $Therapeutic Activity: 8-22 mins        [place] Office [phone] Weekend [phone]   Claretha Cooper 08/05/2022, 10:43 AM

## 2022-08-05 NOTE — Progress Notes (Addendum)
Transition of Care [facility]) - Emergency Department Mini Assessment   Patient Details  Name: Courtney Avila MRN: [id number] Date of Birth: March 10, 1941  [facility] CM/SW Contact:    Kimber Relic, LCSW Phone Number: 08/05/2022, 9:47 AM   Clinical Narrative: Pt presented to ED with destructive behavior. Pt has a hx of dementia. This CSW spoke to the pt's son and informed that the hospital does not place into ALF/Memory Care; however, we are able to attempt to place into rehab short term. This Probation officer informed that PT will see the pt and if they recommend STR at SNF, we can attempt to place contingent upon Ins Auth. This CSW did inform that his mother was referred to [facility] and he will be contacted by Placido Sou to discuss next steps and develop a plan.This CSW informed if PT does not recommend SNF or if Ins Auth is denied, the pt would have to d/c home and he continue to work with Placido Sou with [facility] to place the pt into Memory Care. Courtney Avila is agreeable with d/c plan and TOC will follow for PT recommendations. Courtney Avila did state that if she is approved to go to rehab, he or his son (pt's grandson) can transport her by private vehicle.   ED Mini Assessment: What brought you to the Emergency Department? : destructive behavior and hx of dementia  Barriers to Discharge: ED SNF auth  Barrier interventions: Will submit Auth to insurance if PT recommends SNF  Means of departure: Car       Patient Contact and Communications Key Contact 1: Courtney Avila (son)   Spoke with: Courtney Avila Contact Date: 08/05/22,   Contact time: 0939 Contact Phone Number: [phone] Call outcome: Son agreeable with plan if PT recommends SNF    CMS Medicare.gov Compare Post Acute Care list provided to:: Patient Represenative (must comment) (Son - Courtney Avila) Choice offered to / list presented to : Adult Children  Admission diagnosis:  behavioral disturbance, dementia Patient Active Problem List    Diagnosis Date Noted   OA (osteoarthritis) of knee 12/07/2016   S/P reverse total shoulder arthroplasty, 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

## 2022-08-05 NOTE — ED Notes (Signed)
Patient to room 31. Patient alert. Patient oriented to unit and room.

## 2022-08-05 NOTE — ED Provider Notes (Signed)
Nursing notes and vitals signs, including pulse oximetry, reviewed.  Summary of this visit's results, reviewed by myself:  EKG:  EKG Interpretation  Date/Time:  Wednesday August 05 2022 02:47:40 EST Ventricular Rate:  97 PR Interval:    QRS Duration: 99 QT Interval:  387 QTC Calculation: 492 R Axis:   28 Text Interpretation: Normal sinus rhythm Tremor Artifact limits interpretation Confirmed by Shanon Rosser [id number]) on 08/05/2022 2:53:38 AM        Labs:  Results for orders placed or performed during the hospital encounter of 08/04/22 (from the past 24 hour(s))  Comprehensive metabolic panel     Status: Abnormal   Collection Time: 08/04/22 11:49 PM  Result Value Ref Range   Sodium 140 135 - 145 mmol/L   Potassium 3.5 3.5 - 5.1 mmol/L   Chloride 110 98 - 111 mmol/L   CO2 18 (L) 22 - 32 mmol/L   Glucose, Bld 99 70 - 99 mg/dL   BUN 25 (H) 8 - 23 mg/dL   Creatinine, Ser 1.35 (H) 0.44 - 1.00 mg/dL   Calcium 9.5 8.9 - 10.3 mg/dL   Total Protein 7.0 6.5 - 8.1 g/dL   Albumin 3.8 3.5 - 5.0 g/dL   AST 37 15 - 41 U/L   ALT 14 0 - 44 U/L   Alkaline Phosphatase 48 38 - 126 U/L   Total Bilirubin 0.9 0.3 - 1.2 mg/dL   GFR, Estimated 39 (L) >60 mL/min   Anion gap 12 5 - 15  Ethanol     Status: None   Collection Time: 08/04/22 11:49 PM  Result Value Ref Range   Alcohol, Ethyl (B) <10 <10 mg/dL  CBC with Diff     Status: Abnormal   Collection Time: 08/04/22 11:49 PM  Result Value Ref Range   WBC 9.4 4.0 - 10.5 K/uL   RBC 3.97 3.87 - 5.11 MIL/uL   Hemoglobin 11.9 (L) 12.0 - 15.0 g/dL   HCT 35.2 (L) 36.0 - 46.0 %   MCV 88.7 80.0 - 100.0 fL   MCH 30.0 26.0 - 34.0 pg   MCHC 33.8 30.0 - 36.0 g/dL   RDW 13.8 11.5 - 15.5 %   Platelets 277 150 - 400 K/uL   nRBC 0.0 0.0 - 0.2 %   Neutrophils Relative % 50 %   Neutro Abs 4.7 1.7 - 7.7 K/uL   Lymphocytes Relative 36 %   Lymphs Abs 3.4 0.7 - 4.0 K/uL   Monocytes Relative 13 %   Monocytes Absolute 1.2 (H) 0.1 - 1.0 K/uL   Eosinophils  Relative 1 %   Eosinophils Absolute 0.1 0.0 - 0.5 K/uL   Basophils Relative 0 %   Basophils Absolute 0.0 0.0 - 0.1 K/uL   Immature Granulocytes 0 %   Abs Immature Granulocytes 0.03 0.00 - 0.07 K/uL  TSH     Status: Abnormal   Collection Time: 08/04/22 11:49 PM  Result Value Ref Range   TSH 5.745 (H) 0.350 - 4.500 uIU/mL  T4, free     Status: Abnormal   Collection Time: 08/04/22 11:49 PM  Result Value Ref Range   Free T4 1.41 (H) 0.61 - 1.12 ng/dL  Resp panel by RT-PCR (RSV, Flu A&B, Covid) Anterior Nasal Swab     Status: None   Collection Time: 08/04/22 11:50 PM   Specimen: Anterior Nasal Swab  Result Value Ref Range   SARS Coronavirus 2 by RT PCR NEGATIVE NEGATIVE   Influenza A by PCR NEGATIVE NEGATIVE  Influenza B by PCR NEGATIVE NEGATIVE   Resp Syncytial Virus by PCR NEGATIVE NEGATIVE  Urine rapid drug screen (hosp performed)     Status: Abnormal   Collection Time: 08/05/22  2:48 AM  Result Value Ref Range   Opiates NONE DETECTED NONE DETECTED   Cocaine NONE DETECTED NONE DETECTED   Benzodiazepines POSITIVE (A) NONE DETECTED   Amphetamines NONE DETECTED NONE DETECTED   Tetrahydrocannabinol NONE DETECTED NONE DETECTED   Barbiturates NONE DETECTED NONE DETECTED  Urinalysis, Routine w reflex microscopic -Urine, Clean Catch     Status: Abnormal   Collection Time: 08/05/22  2:48 AM  Result Value Ref Range   Color, Urine YELLOW YELLOW   APPearance CLEAR CLEAR   Specific Gravity, Urine 1.020 1.005 - 1.030   pH 8.0 5.0 - 8.0   Glucose, UA NEGATIVE NEGATIVE mg/dL   Hgb urine dipstick NEGATIVE NEGATIVE   Bilirubin Urine NEGATIVE NEGATIVE   Ketones, ur 5 (A) NEGATIVE mg/dL   Protein, ur 30 (A) NEGATIVE mg/dL   Nitrite NEGATIVE NEGATIVE   Leukocytes,Ua NEGATIVE NEGATIVE   RBC / HPF 0-5 0 - 5 RBC/hpf   WBC, UA 0-5 0 - 5 WBC/hpf   Bacteria, UA NONE SEEN NONE SEEN   Squamous Epithelial / HPF 0-5 0 - 5 /HPF   Mucus PRESENT    Hyaline Casts, UA PRESENT     Imaging Studies: CT  HEAD WO CONTRAST (5MM)  Result Date: 08/04/2022 CLINICAL DATA:  Altered mental status EXAM: CT HEAD WITHOUT CONTRAST TECHNIQUE: Contiguous axial images were obtained from the base of the skull through the vertex without intravenous contrast. RADIATION DOSE REDUCTION: This exam was performed according to the departmental dose-optimization program which includes automated exposure control, adjustment of the mA and/or kV according to patient size and/or use of iterative reconstruction technique. COMPARISON:  MRI 06/23/2022 and CT head 07/09/2019 FINDINGS: Brain: No intracranial hemorrhage, mass effect, or evidence of acute infarct. No hydrocephalus. No extra-axial fluid collection. Generalized cerebral atrophy. Ill-defined hypoattenuation within the cerebral white matter is nonspecific but consistent with chronic small vessel ischemic disease. Vascular: No hyperdense vessel. Intracranial arterial calcification. Skull: No fracture or focal lesion. Sinuses/Orbits: No acute finding. Paranasal sinuses and mastoid air cells are well aerated. Other: None. IMPRESSION: No acute intracranial abnormality. Generalized atrophy and chronic microvascular ischemic change. Electronically Signed   By: Placido Sou M.D.   On: 08/04/2022 23:39   DG Chest Portable 1 View  Result Date: 08/04/2022 CLINICAL DATA:  Altered mental status EXAM: PORTABLE CHEST 1 VIEW COMPARISON:  Radiographs 11/02/2016 FINDINGS: Stable cardiomediastinal silhouette. Aortic atherosclerotic calcification. Prominent right hilum is favored due to leftward patient rotation. No focal consolidation, pleural effusion, or pneumothorax. Right reverse TSA. IMPRESSION: No active disease. Electronically Signed   By: Placido Sou M.D.   On: 08/04/2022 23:28    6:03 AM Laboratory study these and radiographs are largely reassuring except for abnormal thyroid studies.  Further evaluation of her thyroid function and possible treatment will need to be addressed by  her primary care physician once placement is secured.      Shanon Rosser, MD 08/05/22 [phone]

## 2022-08-05 NOTE — ED Notes (Signed)
[place] son into see pt her affect was very bright mood was happy and the visit went well.

## 2022-08-05 NOTE — ED Notes (Signed)
Currently recalling events from her past which is causing her to be tearful when talking to her sitter will request sitter to put a detailed note about the conversation.

## 2022-08-06 MED ORDER — BUSPIRONE HCL 10 MG PO TABS
10.0000 mg | ORAL_TABLET | Freq: Every day | ORAL | Status: DC
  Administered 2022-08-06 – 2022-08-08 (×2): 10 mg via ORAL
  Filled 2022-08-06 (×3): qty 1

## 2022-08-06 MED ORDER — TEMAZEPAM 15 MG PO CAPS
30.0000 mg | ORAL_CAPSULE | Freq: Every day | ORAL | Status: DC
  Administered 2022-08-06 – 2022-08-07 (×3): 30 mg via ORAL
  Filled 2022-08-06 (×3): qty 2

## 2022-08-06 MED ORDER — PAROXETINE HCL 20 MG PO TABS
40.0000 mg | ORAL_TABLET | Freq: Every morning | ORAL | Status: DC
  Administered 2022-08-06 – 2022-08-08 (×2): 40 mg via ORAL
  Filled 2022-08-06 (×3): qty 2

## 2022-08-06 MED ORDER — PRAZOSIN HCL 1 MG PO CAPS
2.0000 mg | ORAL_CAPSULE | Freq: Every day | ORAL | Status: DC
  Administered 2022-08-06 – 2022-08-07 (×3): 2 mg via ORAL
  Filled 2022-08-06 (×3): qty 2

## 2022-08-06 NOTE — Discharge Instructions (Addendum)
Apply for special assistance medicaid and work through your primary care doctor for placement in long-term care.  Also follow-up with primary doctor regarding abnormal thyroid studies.

## 2022-08-06 NOTE — Progress Notes (Signed)
CSW was not able to reach PT as no one responded to this CSW chat, however this CSW asked Dr. Kathrynn Humble to put in other PT consult for this patient.This order was placed by the provider, Placido Sou was called and texted by this CSW as of 8:50 pm there has been no response.   This CSW also was not contacted again by / Loree Fee from central intake about Myriam Jacobson who was due to have an in-person assessment today with the patient. TOC will continue to follow with updates.

## 2022-08-06 NOTE — ED Notes (Signed)
Patient resting quietly in bed, previous nurse reporting patient was given night meds late.

## 2022-08-06 NOTE — ED Notes (Signed)
Rambling delusional conversation about her family wanting to kill her and discrbes a white object that extends out of the ceiling vent in her room the returns back. Pt is currently actively having delusions that her family, mostly her son, is out to get her and are lying on her saying she is see things that aren't there. Affect is flat mood is labile no aggressive behavior noted.

## 2022-08-06 NOTE — Progress Notes (Signed)
At this time this CSW has not heard from [place] 4:40. This CSW also called [employer] CSW left V/M. TOC will continue to follow.

## 2022-08-06 NOTE — Progress Notes (Addendum)
This CSW has followed up with Melissa at [facility] regarding bed offer. Awaiting response.   Addend @ 11:09 AM Spoke with Melissa at [facility] - she is reviewing the pt and seeing if there is availability in their locked unit. She will call this Probation officer back.   Addend @ 12:43 PM Melissa reports she is waiting to speak with her DON.   Addend @ 1:55 PM [facility] has denied the pt at this time.

## 2022-08-06 NOTE — Progress Notes (Addendum)
Followed up with all pending/considering SNFs. Contacted [facility] [employer] by phone.  Whitney (central intake) reports Myriam Jacobson will be completing a bedside assessment.   Addend @ 3:23 PM This CSW spoke with [name], son, to inform to be on the lookout for [employer] phone call to discuss long term care options. This CSW also updated [name] and informed that [facility] declined but we are following up with pending facilities. Tracey at [facility] at [employer] needs to see that the pt is ambulatory. TOC to reach out to RN and request pt be transferred to hospital bed. TOC will also contact PT to see if the pt can be seen again once transferred to hospital bed.

## 2022-08-06 NOTE — ED Notes (Signed)
Patient is hallucinating saying her son wants her dead, there is a gang that is coming to kill her and they will not stop until she's dead.

## 2022-08-06 NOTE — ED Provider Notes (Signed)
Emergency Medicine Observation Re-evaluation Note  Courtney Avila is a 82 y.o. female with history of dementia, seen on rounds today.  Pt initially presented to the ED for complaints of Aggressive Behavior Currently, the patient is doing well.  Physical Exam  BP (!) 142/59 (BP Location: Left Arm)   Pulse 63   Temp 98.7 F (37.1 C) (Oral)   Resp 16   Ht '4\' 11"'$  (1.499 m)   Wt 60 kg   SpO2 98%   BMI 26.72 kg/m  Physical Exam General: Resting comfortably Lungs: Normal work of breathing Psych: Calm and cooperative  ED Course / MDM  EKG:EKG Interpretation  Date/Time:  Wednesday August 05 2022 02:47:40 EST Ventricular Rate:  97 PR Interval:    QRS Duration: 99 QT Interval:  387 QTC Calculation: 492 R Axis:   28 Text Interpretation: Normal sinus rhythm Tremor Artifact limits interpretation Confirmed by [place]us, John [id number]) on 08/05/2022 2:53:38 AM  I have reviewed the labs performed to date as well as medications administered while in observation.  Recent changes in the last 24 hours include patient is having delusional conversation about her children.  Feel that she is likely experiencing worsening dementia is already being worked up for delirium which was overall reassuring.  Plan  Current plan is for placement. #Abnormal TFTs - PCP fu   Fransico Meadow, MD 08/06/22 [phone]

## 2022-08-06 NOTE — ED Notes (Signed)
RN made aware that patient is resting quietly, tech attempted to wake patient to give breakfast tray but patient remained asleep, she was compliant with morning meds.  Vitlas WNL, respirations equal and unlabored.

## 2022-08-06 NOTE — Progress Notes (Signed)
CSW has reached out to [place] from central intake at this time Myriam Jacobson has not done the assessment. This CSW has also reached out to provider to have PT see this Pt again. TOC will update as they come.

## 2022-08-07 MED ORDER — LORAZEPAM 1 MG PO TABS
1.0000 mg | ORAL_TABLET | Freq: Once | ORAL | Status: AC
  Administered 2022-08-07: 1 mg via ORAL
  Filled 2022-08-07: qty 1

## 2022-08-07 MED ORDER — ACETAMINOPHEN 325 MG PO TABS
650.0000 mg | ORAL_TABLET | Freq: Four times a day (QID) | ORAL | Status: DC | PRN
  Administered 2022-08-07 – 2022-08-08 (×2): 650 mg via ORAL
  Filled 2022-08-07 (×2): qty 2

## 2022-08-07 NOTE — Progress Notes (Signed)
Physical Therapy Treatment Patient Details Name: Courtney Avila MRN: [id number] DOB: 21-Jun-1941 Today's Date: 08/07/2022   History of Present Illness 82 year old female with medical history significant for dementia with behavioral disturbance, follows outpatient with neurology, recent MRI imaging which showed cerebral atrophy who presents to the emergency department with behavioral disturbance in the home and alteration from her baseline mental status.    PT Comments    The patient is pleasant. Patient noted seated on bed edge eating prior to therapy. Patient now in bed, supine. No assistance to sit up nor return to supine, Min assistance  with 1 HHA, required support as noted imbalance with trial of no support. Continue PT.  Recommendations for follow up therapy are one component of a multi-disciplinary discharge planning process, led by the attending physician.  Recommendations may be updated based on patient status, additional functional criteria and insurance authorization.  Follow Up Recommendations  Skilled nursing-short term rehab (<3 hours/day) Can patient physically be transported by private vehicle: Yes   Assistance Recommended at Discharge Frequent or constant Supervision/Assistance  Patient can return home with the following Assistance with cooking/housework;A lot of help with bathing/dressing/bathroom;A lot of help with walking and/or transfers;Assist for transportation;Help with stairs or ramp for entrance   Equipment Recommendations  None recommended by PT    Recommendations for Other Services       Precautions / Restrictions Precautions Precautions: Fall     Mobility  Bed Mobility   Bed Mobility: Supine to Sit, Sit to Supine     Supine to sit: Supervision Sit to supine: Supervision        Transfers   Equipment used: None Transfers: Sit to/from Stand Sit to Stand: Min guard                Ambulation/Gait Ambulation/Gait assistance: Herbalist (Feet): 150 Feet Assistive device: 1 person hand held assist Gait Pattern/deviations: Step-through pattern, Drifts right/left       General Gait Details: requires support, attempted  x 10' with no support,  decreased balance without support   Stairs             Wheelchair Mobility    Modified Rankin (Stroke Patients Only)       Balance Overall balance assessment: Needs assistance Sitting-balance support: No upper extremity supported Sitting balance-Leahy Scale: Good     Standing balance support: During functional activity, Single extremity supported, Reliant on assistive device for balance Standing balance-Leahy Scale: Poor                              Cognition Arousal/Alertness: Awake/alert Behavior During Therapy: WFL for tasks assessed/performed                                   General Comments: oriented to self, follow simple directions        Exercises      General Comments        Pertinent Vitals/Pain Pain Assessment Pain Assessment: No/denies pain    Home Living                          Prior Function            PT Goals (current goals can now be found in the care plan section) Progress towards PT goals: Progressing toward goals  Frequency    Min 2X/week      PT Plan Current plan remains appropriate    Co-evaluation              AM-PAC PT "6 Clicks" Mobility   Outcome Measure  Help needed turning from your back to your side while in a flat bed without using bedrails?: None Help needed moving from lying on your back to sitting on the side of a flat bed without using bedrails?: None Help needed moving to and from a bed to a chair (including a wheelchair)?: A Little Help needed standing up from a chair using your arms (e.g., wheelchair or bedside chair)?: A Little Help needed to walk in hospital room?: A Little Help needed climbing 3-5 steps with a railing? : A  Lot 6 Click Score: 19    End of Session Equipment Utilized During Treatment: Gait belt Activity Tolerance: Patient tolerated treatment well Patient left: in bed;with call bell/phone within reach;with bed alarm set Nurse Communication: Mobility status PT Visit Diagnosis: Unsteadiness on feet (R26.81)     Time: 6045-4098 PT Time Calculation (min) (ACUTE ONLY): 24 min  Charges:  $Gait Training: 23-37 mins                     [place] Office [phone] Weekend pager-[phone]    Claretha Cooper 08/07/2022, 9:53 AM

## 2022-08-07 NOTE — ED Notes (Signed)
Pt woke up with agitation, hallucination, and confusion. Pt redirected but pt anxiety increased. MD notified, Medications ordered and given

## 2022-08-07 NOTE — Progress Notes (Signed)
Courtney Avila has reached back out, casey reported that she was unaware of her appointment with this patient, however she will review the patient. Courtney Avila reported that she would asses this patient on Monday. TOC will continue to follow.

## 2022-08-07 NOTE — ED Notes (Addendum)
Patient woke up c/o stomach pain. Notified Dr. Leonette Monarch. New order for PRN tylenol ordered and given. Will continue to monitor.

## 2022-08-07 NOTE — Progress Notes (Addendum)
This CSW attempted to contact Courtney Avila (central intake) without success. Left voicemail.   Courtney Avila reported: "The pt does not have any financial support and she is ineligible for VA benefits or anything else." Courtney Avila reported she informed the son he will need to go through DSS to apply for Medicaid. She did email Courtney Avila (son) the link to begin the Medicaid application.  Addend @ 3:09 PM TOC to follow up with Courtney Avila at [facility] [phone].

## 2022-08-07 NOTE — Progress Notes (Signed)
CSW has called the patient's son and made him aware that [facility] has exhausted all resources in possible placement for the patient. TOC has advised the son at this time to apply for special assistance medicaid and get the patient placed in LTC. The son has agreed and will come get the mom in the AM. TOC will follow DC.

## 2022-08-07 NOTE — ED Notes (Signed)
Patient alert during shift. Patient slept most of the shift.  The cooeprative.
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
## 2022-08-07 NOTE — Progress Notes (Signed)
CSW reached out to [employer] N/A left VM. TOC will continue to follow.

## 2022-08-07 NOTE — Progress Notes (Addendum)
Per RN, pt is in hospital bed. TOC to follow for PT's evaluation.  [facility] is considering the pt but needing to know if pt is able to ambulate. TOC following.  Myriam Jacobson is still supposed to be completing an in person assessment.   Addend @ 10:11 AM [facility] has declined the pt at this time. They do not have a female bed in their locked memoryc care unit and would not want to offer a bed in there skilled area due to ambulation and behaviors.   Addend @ 10:21 AM Followed up with the pt's son - informed of updates and encouraged to be on the lookout for a phone call from Placido Sou with [facility]. Contacted [facility] via phone and followed up on considering status - Crystal will have her DON review. Reached out to [facility] (central intake) to inquire about if/when Myriam Jacobson will come to complete in person assessment. Attempted to contact admissions at [facility] without success - unable to leave a voicemail.

## 2022-08-07 NOTE — ED Provider Notes (Signed)
Emergency Medicine Observation Re-evaluation Note  Courtney Avila is a 82 y.o. female with history of dementia, seen on rounds today.  Pt initially presented to the ED for complaints of Aggressive Behavior Currently, the patient is doing well.  Physical Exam  BP 110/81 (BP Location: Left Arm)   Pulse 86   Temp 98 F (36.7 C) (Oral)   Resp 18   Ht '4\' 11"'$  (1.499 m)   Wt 60 kg   SpO2 97%   BMI 26.72 kg/m  Physical Exam General: Resting comfortably Lungs: Normal work of breathing Psych: Calm and cooperative  ED Course / MDM  EKG:EKG Interpretation  Date/Time:  Wednesday August 05 2022 02:47:40 EST Ventricular Rate:  97 PR Interval:    QRS Duration: 99 QT Interval:  387 QTC Calculation: 492 R Axis:   28 Text Interpretation: Normal sinus rhythm Tremor Artifact limits interpretation Confirmed by Molpus, John [phone]) on 08/05/2022 2:53:38 AM  I have reviewed the labs performed to date as well as medications administered while in observation.  Recent changes in the last 24 hours include patient is having delusional conversation about her children.  Feel that she is likely experiencing worsening dementia is already being worked up for delirium which was overall reassuring.  Plan  Current plan is for placement. #Abnormal TFTs - PCP fu      Deno Etienne, DO 08/07/22 [phone]

## 2022-08-08 NOTE — Progress Notes (Signed)
CSW spoke with pt's son he reported pt's grandson his on the way to pick pt up for d/c . MD and RN made aware.  Arlie Solomons.Rahaf Carbonell, MSW, [place]  Transitions of Care Clinical Social Worker I Direct Dial: [phone]  Fax: [phone] [email or web address]

## 2022-08-08 NOTE — ED Provider Notes (Addendum)
Emergency Medicine Observation Re-evaluation Note  Courtney Avila is a 82 y.o. female, seen on rounds today.  Pt initially presented to the ED for complaints of Aggressive Behavior Currently, the patient is sleeping.  Physical Exam  BP 127/79 (BP Location: Right Arm)   Pulse 73   Temp 98.4 F (36.9 C) (Oral)   Resp 18   Ht '4\' 11"'$  (1.499 m)   Wt 60 kg   SpO2 96%   BMI 26.72 kg/m  Physical Exam General: Sleeping Cardiac: Extremities well-perfused Lungs: Breathing is unlabored Psych: Deferred  ED Course / MDM  EKG:EKG Interpretation  Date/Time:  Wednesday August 05 2022 02:47:40 EST Ventricular Rate:  97 PR Interval:    QRS Duration: 99 QT Interval:  387 QTC Calculation: 492 R Axis:   28 Text Interpretation: Normal sinus rhythm Tremor Artifact limits interpretation Confirmed by Molpus, John [phone]) on 08/05/2022 2:53:38 AM  I have reviewed the labs performed to date as well as medications administered while in observation.  Recent changes in the last 24 hours include: Social work informed patient's son that they have access to resources for possible placement.  Son will come and get the patient this morning.  TOC will follow after discharge.  Recurrence of agitation and confusion last night.  Plan  Current plan is for discharge with son coming to get her this morning.      Godfrey Pick, MD 08/08/22 1017

## 2022-08-10 ENCOUNTER — Encounter: Payer: PPO | Admitting: Nurse Practitioner

## 2022-08-12 ENCOUNTER — Telehealth: Payer: Self-pay

## 2022-08-12 NOTE — Telephone Encounter (Signed)
Transition Care Management Unsuccessful Follow-up Telephone Call  Date of discharge and from where:  08/08/2022; long  Attempts:  3rd Attempt  Reason for unsuccessful TCM follow-up call:  Left voice message

## 2022-09-21 ENCOUNTER — Encounter: Payer: Self-pay | Admitting: Nurse Practitioner

## 2022-09-21 ENCOUNTER — Ambulatory Visit (INDEPENDENT_AMBULATORY_CARE_PROVIDER_SITE_OTHER): Payer: PPO | Admitting: Nurse Practitioner

## 2022-09-21 VITALS — BP 122/76 | HR 76 | Temp 97.9°F | Ht 59.0 in | Wt 128.0 lb

## 2022-09-21 DIAGNOSIS — E538 Deficiency of other specified B group vitamins: Secondary | ICD-10-CM

## 2022-09-21 DIAGNOSIS — D509 Iron deficiency anemia, unspecified: Secondary | ICD-10-CM

## 2022-09-21 DIAGNOSIS — M7062 Trochanteric bursitis, left hip: Secondary | ICD-10-CM

## 2022-09-21 DIAGNOSIS — F5101 Primary insomnia: Secondary | ICD-10-CM | POA: Diagnosis not present

## 2022-09-21 DIAGNOSIS — F419 Anxiety disorder, unspecified: Secondary | ICD-10-CM | POA: Diagnosis not present

## 2022-09-21 DIAGNOSIS — M1991 Primary osteoarthritis, unspecified site: Secondary | ICD-10-CM

## 2022-09-21 DIAGNOSIS — R251 Tremor, unspecified: Secondary | ICD-10-CM | POA: Diagnosis not present

## 2022-09-21 DIAGNOSIS — F01B4 Vascular dementia, moderate, with anxiety: Secondary | ICD-10-CM

## 2022-09-21 LAB — CBC WITH DIFFERENTIAL/PLATELET
Absolute Monocytes: 490 cells/uL (ref 200–950)
Basophils Absolute: 41 cells/uL (ref 0–200)
Basophils Relative: 0.6 %
Eosinophils Absolute: 179 cells/uL (ref 15–500)
Eosinophils Relative: 2.6 %
HCT: 36.5 % (ref 35.0–45.0)
Hemoglobin: 12 g/dL (ref 11.7–15.5)
Lymphs Abs: 2167 cells/uL (ref 850–3900)
MCH: 29.8 pg (ref 27.0–33.0)
MCHC: 32.9 g/dL (ref 32.0–36.0)
MCV: 90.6 fL (ref 80.0–100.0)
MPV: 9.7 fL (ref 7.5–12.5)
Monocytes Relative: 7.1 %
Neutro Abs: 4023 cells/uL (ref 1500–7800)
Neutrophils Relative %: 58.3 %
Platelets: 291 10*3/uL (ref 140–400)
RBC: 4.03 10*6/uL (ref 3.80–5.10)
RDW: 13.6 % (ref 11.0–15.0)
Total Lymphocyte: 31.4 %
WBC: 6.9 10*3/uL (ref 3.8–10.8)

## 2022-09-21 LAB — COMPLETE METABOLIC PANEL WITH GFR
AG Ratio: 1.6 (calc) (ref 1.0–2.5)
ALT: 14 U/L (ref 6–29)
AST: 21 U/L (ref 10–35)
Albumin: 4 g/dL (ref 3.6–5.1)
Alkaline phosphatase (APISO): 64 U/L (ref 37–153)
BUN/Creatinine Ratio: 29 (calc) — ABNORMAL HIGH (ref 6–22)
BUN: 28 mg/dL — ABNORMAL HIGH (ref 7–25)
CO2: 27 mmol/L (ref 20–32)
Calcium: 9.4 mg/dL (ref 8.6–10.4)
Chloride: 106 mmol/L (ref 98–110)
Creat: 0.95 mg/dL (ref 0.60–0.95)
Globulin: 2.5 g/dL (calc) (ref 1.9–3.7)
Glucose, Bld: 94 mg/dL (ref 65–99)
Potassium: 4.6 mmol/L (ref 3.5–5.3)
Sodium: 141 mmol/L (ref 135–146)
Total Bilirubin: 0.3 mg/dL (ref 0.2–1.2)
Total Protein: 6.5 g/dL (ref 6.1–8.1)
eGFR: 59 mL/min/{1.73_m2} — ABNORMAL LOW (ref >=60)

## 2022-09-21 MED ORDER — BUSPIRONE HCL 10 MG PO TABS: 10.0000 mg | ORAL_TABLET | Freq: Two times a day (BID) | ORAL | 0 refills | Status: AC

## 2022-09-21 MED ORDER — VITAMIN B-12 1000 MCG PO TABS: 1000.0000 ug | ORAL_TABLET | Freq: Every day | ORAL | 1 refills | Status: DC

## 2022-09-21 NOTE — Progress Notes (Signed)
Careteam: Patient Care Team: Lauree Chandler, NP as PCP - General (Geriatric Medicine)  PLACE OF SERVICE:  [facility] Directive information Does Patient Have a Medical Advance Directive?: Yes, Type of Advance Directive: [place], Does patient want to make changes to medical advance directive?: No - Patient declined  No Known Allergies  Chief Complaint  Patient presents with   Acute Visit    Left  hip pain off/on x several years. Patient had a right knee placement in 2018 and her son believes she is shifting weight to left side possibly irritating left hip. Here with son.    Medical Management of Chronic Issues    Routine visit. Discuss need for AWV, covid boosters, td/tdap, DEXA, and shingrix or post pone if patient refuse      HPI: Patient is a 82 y.o. female for routine follow up.   Reports hip pain for years. On left. Reports she lays on the left side a lot.   Insomnia- on sleep aide nightly. Reports she has dry mouth.   she went to the hospital due to worsening anxiety with her dementia She is seeing psychiatrist and planning to start something for hallucination   Review of Systems:  Review of Systems  Constitutional:  Negative for chills, fever and weight loss.  HENT:  Negative for tinnitus.   Respiratory:  Negative for cough, sputum production and shortness of breath.   Cardiovascular:  Negative for chest pain, palpitations and leg swelling.  Gastrointestinal:  Negative for abdominal pain, constipation, diarrhea and heartburn.  Genitourinary:  Negative for dysuria, frequency and urgency.  Musculoskeletal:  Positive for joint pain and myalgias. Negative for back pain and falls.  Skin: Negative.   Neurological:  Positive for weakness. Negative for dizziness and headaches.  Psychiatric/Behavioral:  Positive for memory loss. Negative for depression. The patient is nervous/anxious and has insomnia.     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 She has never used smokeless tobacco. She reports that she does not drink alcohol and does not use drugs.  Family History  Problem Relation Age of Onset   Diabetes Mother    Hypertension Mother    Heart disease Father    Colon cancer Father    Breast cancer Sister     Medications: Patient's Medications  New Prescriptions   No medications on file  Previous Medications   BUSPIRONE (BUSPAR) 10 MG TABLET    Take 20 mg by mouth at bedtime.   DOXYLAMINE SUCCINATE, SLEEP, (SLEEP AID PO)    Take 1 tablet by mouth at bedtime.   MULTIPLE VITAMINS-MINERALS (HAIR SKIN AND NAILS FORMULA PO)    Take 1 tablet by mouth daily.  PAROXETINE (PAXIL) 40 MG TABLET    Take 40 mg by mouth in the morning.   PRAZOSIN (MINIPRESS) 1 MG CAPSULE    Take 2 mg by mouth at bedtime.   TEMAZEPAM (RESTORIL) 30 MG CAPSULE    Take 30 mg by mouth at bedtime.   TYLENOL 500 MG TABLET    Take 500 mg by mouth every 6 (six) hours as needed for mild pain or headache.  Modified Medications   No medications on file  Discontinued Medications   No medications on file    Physical Exam:  Vitals:   09/21/22 1438  BP: 122/76  Pulse: 76  Temp: 97.9 F (36.6 C)  TempSrc: Temporal  SpO2: 98%  Weight: 128 lb  (58.1 kg)  Height: 4\' 11"  (1.499 m)   Body mass index is 25.85 kg/m. Wt Readings from Last 3 Encounters:  09/21/22 128 lb (58.1 kg)  08/04/22 132 lb 4.4 oz (60 kg)  05/27/22 133 lb (60.3 kg)    Physical Exam Constitutional:      General: She is not in acute distress.    Appearance: She is well-developed. She is not diaphoretic.  HENT:     Head: Normocephalic and atraumatic.     Mouth/Throat:     Pharynx: No oropharyngeal exudate.  Eyes:     Conjunctiva/sclera: Conjunctivae normal.     Pupils: Pupils are equal, round, and reactive to light.  Cardiovascular:     Rate and Rhythm: Normal rate and regular rhythm.     Heart sounds: Normal heart sounds.  Pulmonary:     Effort: Pulmonary effort is normal.     Breath sounds: Normal breath sounds.  Abdominal:     General: Bowel sounds are normal.     Palpations: Abdomen is soft.  Musculoskeletal:     Cervical back: Normal range of motion and neck supple.     Right lower leg: No edema.     Left lower leg: No edema.  Skin:    General: Skin is warm and dry.  Neurological:     Mental Status: She is alert.     Gait: Gait abnormal.  Psychiatric:        Mood and Affect: Mood normal.     Labs reviewed: Basic Metabolic Panel: Recent Labs    05/04/22 1135 08/04/22 2349  NA 142 140  K 4.7 3.5  CL 106 110  CO2 22 18*  GLUCOSE 82 99  BUN 16 25*  CREATININE 1.01* 1.35*  CALCIUM 9.6 9.5  TSH 2.53 5.745*   Liver Function Tests: Recent Labs    05/04/22 1135 08/04/22 2349  AST 22 37  ALT 9 14  ALKPHOS  --  48  BILITOT 0.6 0.9  PROT 6.9 7.0  ALBUMIN  --  3.8   No results for input(s): "LIPASE", "AMYLASE" in the last 8760 hours. No results for input(s): "AMMONIA" in the last 8760 hours. CBC: Recent Labs    05/04/22 1135 08/04/22 2349  WBC 5.3 9.4  NEUTROABS 2,682 4.7  HGB 13.5 11.9*  HCT 40.1 35.2*  MCV 88.9 88.7  PLT 275 277   Lipid Panel: No results for input(s): "CHOL", "HDL", "LDLCALC", "TRIG", "CHOLHDL",  "LDLDIRECT" in the last 8760 hours. TSH: Recent Labs    05/04/22 1135 08/04/22 2349  TSH 2.53 5.745*   A1C: Lab Results  Component Value Date   HGBA1C 5.9 (H) 03/24/2015     Assessment/Plan 1. Tremor -ongoing. Has not worsened. Will continue to monitor.  2. Primary insomnia -to stop doxylamine sleep aid. Continues on temazepam  Can add melatonin 1 mg by mouth at bedtime if needed  3. Anxiety Continues to follow up with psych -continues on paxil, buspar and temaxepam qhs - busPIRone (BUSPAR) 10 MG tablet; Take 1 tablet (10 mg total) by mouth 2 (two) times daily.  Dispense: 60 tablet; Refill: 0  4. Iron deficiency anemia, unspecified iron deficiency anemia type -will follow up hgb at this time.  - CBC with Differential/Platelet  5. Primary osteoarthritis, unspecified site -ongoing, can use tylenol PRN - COMPLETE METABOLIC PANEL WITH GFR - CBC with Differential/Platelet  6. B12 deficiency - cyanocobalamin (VITAMIN B12) 1000 MCG tablet; Take 1 tablet (1,000 mcg total) by mouth daily.  Dispense: 30 tablet; Refill: 1  7. Trochanteric bursitis of left hip -to use ice to bilateral hips.  -avoid laying on left side.  Tylenol PRN - Ambulatory referral to [facility]  8. Moderate vascular dementia with anxiety (HCC) -Stable, no acute changes in cognitive or functional status, continue supportive care. Continues to follow up with psych and neuro  Return in about 3 months (around 12/22/2022) for routine follow up .  Carlos American. [place], [facility] [phone]

## 2022-09-21 NOTE — Patient Instructions (Addendum)
Make sure you are taking buspar 1 tablet in the morning and 1 in the evening.   STOP doxylamine   Start b12 supplement- give 1000 mcg in the morning  Make sure you are eating 3 meals a day- to take boost, ensure daily with smallest meal.   Ice hips three times daily Tylenol 1000 mg by mouth every 8 hours as needed pain.  Avoid laying on left side.

## 2022-09-29 ENCOUNTER — Telehealth: Payer: Self-pay

## 2022-09-29 NOTE — Telephone Encounter (Signed)
Langley Gauss, RN with [facility] called to request verbal orders for delay of start of care for patient,due to not being able to get patient on scheduled. Will start care tomorrow.  Verbal orders given

## 2022-10-02 ENCOUNTER — Encounter (HOSPITAL_COMMUNITY): Payer: Self-pay

## 2022-10-02 ENCOUNTER — Emergency Department (HOSPITAL_COMMUNITY)
Admission: EM | Admit: 2022-10-02 | Discharge: 2022-10-07 | Disposition: A | Discharge status: Home / Self Care | Payer: PPO | Attending: Emergency Medicine | Admitting: Emergency Medicine

## 2022-10-02 ENCOUNTER — Emergency Department (HOSPITAL_COMMUNITY): Payer: PPO

## 2022-10-02 ENCOUNTER — Other Ambulatory Visit: Payer: Self-pay

## 2022-10-02 DIAGNOSIS — Z23 Encounter for immunization: Secondary | ICD-10-CM | POA: Diagnosis not present

## 2022-10-02 DIAGNOSIS — R2681 Unsteadiness on feet: Secondary | ICD-10-CM | POA: Diagnosis not present

## 2022-10-02 DIAGNOSIS — R4182 Altered mental status, unspecified: Secondary | ICD-10-CM | POA: Diagnosis not present

## 2022-10-02 DIAGNOSIS — M6281 Muscle weakness (generalized): Secondary | ICD-10-CM | POA: Insufficient documentation

## 2022-10-02 DIAGNOSIS — F0390 Unspecified dementia without behavioral disturbance: Secondary | ICD-10-CM | POA: Diagnosis not present

## 2022-10-02 HISTORY — DX: Unspecified dementia, unspecified severity, without behavioral disturbance, psychotic disturbance, mood disturbance, and anxiety: F03.90

## 2022-10-02 LAB — CBC WITH DIFFERENTIAL/PLATELET
Abs Immature Granulocytes: 0.02 10*3/uL (ref 0.00–0.07)
Basophils Absolute: 0.1 10*3/uL (ref 0.0–0.1)
Basophils Relative: 1 %
Eosinophils Absolute: 0.1 10*3/uL (ref 0.0–0.5)
Eosinophils Relative: 1 %
HCT: 32.9 % — ABNORMAL LOW (ref 36.0–46.0)
Hemoglobin: 10.7 g/dL — ABNORMAL LOW (ref 12.0–15.0)
Immature Granulocytes: 0 %
Lymphocytes Relative: 32 %
Lymphs Abs: 2.8 10*3/uL (ref 0.7–4.0)
MCH: 30.5 pg (ref 26.0–34.0)
MCHC: 32.5 g/dL (ref 30.0–36.0)
MCV: 93.7 fL (ref 80.0–100.0)
Monocytes Absolute: 0.9 10*3/uL (ref 0.1–1.0)
Monocytes Relative: 10 %
Neutro Abs: 5.1 10*3/uL (ref 1.7–7.7)
Neutrophils Relative %: 56 %
Platelets: 232 10*3/uL (ref 150–400)
RBC: 3.51 MIL/uL — ABNORMAL LOW (ref 3.87–5.11)
RDW: 14.6 % (ref 11.5–15.5)
WBC: 9 10*3/uL (ref 4.0–10.5)
nRBC: 0 % (ref 0.0–0.2)

## 2022-10-02 LAB — COMPREHENSIVE METABOLIC PANEL
ALT: 20 U/L (ref 0–44)
AST: 39 U/L (ref 15–41)
Albumin: 3.8 g/dL (ref 3.5–5.0)
Alkaline Phosphatase: 57 U/L (ref 38–126)
Anion gap: 10 (ref 5–15)
BUN: 40 mg/dL — ABNORMAL HIGH (ref 8–23)
CO2: 21 mmol/L — ABNORMAL LOW (ref 22–32)
Calcium: 9.2 mg/dL (ref 8.9–10.3)
Chloride: 107 mmol/L (ref 98–111)
Creatinine, Ser: 1 mg/dL (ref 0.44–1.00)
GFR, Estimated: 56 mL/min — ABNORMAL LOW (ref >60)
Glucose, Bld: 103 mg/dL — ABNORMAL HIGH (ref 70–99)
Potassium: 3.8 mmol/L (ref 3.5–5.1)
Sodium: 138 mmol/L (ref 135–145)
Total Bilirubin: 0.6 mg/dL (ref 0.3–1.2)
Total Protein: 6.6 g/dL (ref 6.5–8.1)

## 2022-10-02 MED ORDER — LORAZEPAM 2 MG/ML IJ SOLN
1.0000 mg | Freq: Once | INTRAMUSCULAR | Status: AC
  Administered 2022-10-02: 1 mg via INTRAMUSCULAR
  Filled 2022-10-02: qty 1

## 2022-10-03 ENCOUNTER — Emergency Department (HOSPITAL_COMMUNITY): Payer: PPO

## 2022-10-03 LAB — URINALYSIS, ROUTINE W REFLEX MICROSCOPIC
Bilirubin Urine: NEGATIVE
Glucose, UA: NEGATIVE mg/dL
Hgb urine dipstick: NEGATIVE
Ketones, ur: NEGATIVE mg/dL
Nitrite: NEGATIVE
Protein, ur: NEGATIVE mg/dL
Specific Gravity, Urine: 1.026 (ref 1.005–1.030)
pH: 5 (ref 5.0–8.0)

## 2022-10-03 LAB — CBG MONITORING, ED: Glucose-Capillary: 99 mg/dL (ref 70–99)

## 2022-10-03 LAB — TSH: TSH: 2.365 u[IU]/mL (ref 0.350–4.500)

## 2022-10-03 MED ORDER — TETANUS-DIPHTH-ACELL PERTUSSIS 5-2.5-18.5 LF-MCG/0.5 IM SUSY
0.5000 mL | PREFILLED_SYRINGE | Freq: Once | INTRAMUSCULAR | Status: AC
  Administered 2022-10-03: 0.5 mL via INTRAMUSCULAR
  Filled 2022-10-03: qty 0.5

## 2022-10-04 MED ORDER — LORAZEPAM 1 MG PO TABS
1.0000 mg | ORAL_TABLET | Freq: Once | ORAL | Status: AC
  Administered 2022-10-04: 1 mg via ORAL
  Filled 2022-10-04: qty 1

## 2022-10-04 MED ORDER — ACETAMINOPHEN 325 MG PO TABS
650.0000 mg | ORAL_TABLET | ORAL | Status: DC | PRN
  Administered 2022-10-04 – 2022-10-05 (×2): 650 mg via ORAL
  Filled 2022-10-04 (×2): qty 2

## 2022-10-05 MED ORDER — LORAZEPAM 1 MG PO TABS
1.0000 mg | ORAL_TABLET | Freq: Once | ORAL | Status: AC
  Administered 2022-10-05: 1 mg via ORAL
  Filled 2022-10-05: qty 1

## 2022-10-06 MED ORDER — DOXYLAMINE SUCCINATE (SLEEP) 25 MG PO TABS
12.5000 mg | ORAL_TABLET | Freq: Every evening | ORAL | Status: DC | PRN
  Administered 2022-10-06 – 2022-10-07 (×2): 12.5 mg via ORAL
  Filled 2022-10-06 (×4): qty 1

## 2022-10-07 ENCOUNTER — Encounter: Payer: Self-pay | Admitting: Nurse Practitioner

## 2022-10-07 NOTE — Progress Notes (Addendum)
Patient's son called and stated "I just spoke with [employer] and they don't have any record of any request for rehab." This CSW assured the pt that Authorization was requested and informed that I'd spoken with Marlowe Kays who informed the pt's Auth as denied. This Probation officer informed the pt's son that they are supposed to be faxing over the denial letter (to appeal if they wish). Informed pt's son that Josem Kaufmann was escalated to Medical Director for review and the medical director determined that the pt is currently at her baseline. Pt's son, Ancil Boozer, stated he will be contacting his son to pick the pt up and will notify this CSW of a time.   Addend @ 2:04 PM Spoke with [name] who informs his son Erlene Quan (pt's grandson) will be picking the pt up closer to 4 PM. This CSW encouraged [name] to visit DSS to apply for Medicaid on the pt's behalf as that is the long term payor source. [name] verbalized understanding. Notified RN.

## 2022-10-07 NOTE — ED Provider Notes (Signed)
Emergency Medicine Observation Re-evaluation Note  Courtney Avila is a 82 y.o. female, seen on rounds today.  Pt initially presented to the ED for complaints of No chief complaint on file. Currently, the patient is resting comfortably.  Physical Exam  BP 138/65 (BP Location: Right Arm)   Pulse 60   Temp 98.6 F (37 C) (Oral)   Resp 15   SpO2 96%  Physical Exam General: NAD   ED Course / MDM  EKG:EKG Interpretation  Date/Time:  Saturday October 03 2022 01:58:10 EDT Ventricular Rate:  68 PR Interval:  220 QRS Duration: 90 QT Interval:  390 QTC Calculation: 415 R Axis:   61 Text Interpretation: Sinus rhythm Prolonged PR interval Artifact No previous ECGs available Confirmed by Molpus, John [id number]) on 10/03/2022 2:11:11 AM  I have reviewed the labs performed to date as well as medications administered while in observation.  Recent changes in the last 24 hours include no acute events reported.  Plan  Current plan is for placement.    Valarie Merino, MD 10/07/22 [phone]

## 2022-10-07 NOTE — Progress Notes (Addendum)
This CSW was informed by Narda Rutherford at [facility] that they are no longer able to offer a bed for the pt. This CSW spke to pt's son, [name], and informed of the update and stated there are no other bed offers. This Probation officer informed that pt has been declined at the other facilities for various reasons. This CSW also explained that we have not received insurance Authorizaton; however, the authorization request is facility specific and would not be beneficial due to [facility]'s rescinding their bed offer. [name] verbalized understanding. This CSW asked [name] to inform our team of who will be picking the pt up. TOC has encouraged [name] to begin the Medicaid process to transition pt to LTC from home through her PCP.   Addend @ 12:08 PM Auth denied. Medical director states pt is at baseline.

## 2022-10-07 NOTE — Discharge Instructions (Signed)
Please visit Department of Social Services to begin [facility] Application.

## 2022-10-07 NOTE — ED Provider Notes (Signed)
Patient reassessed.  Back to baseline.  Denies any AVH, SI, or HI.  Patient unable to be placed in facility and son has agreed to pick her up.   Fransico Meadow, MD 10/07/22 [phone]

## 2022-10-07 NOTE — Progress Notes (Signed)
Attempted to contact [employer] for a follow up. Left HIPAA Compliant voicemail.

## 2022-10-07 NOTE — ED Notes (Signed)
PT up ambulating patient at this time

## 2022-10-07 NOTE — Progress Notes (Signed)
Physical Therapy Treatment Patient Details Name: Courtney Avila MRN: [id number] DOB: [birth year] Today's Date: 10/07/2022   History of Present Illness Pt admitted from home with finger lacerations from broken window and hx of dementia    PT Comments    Assisted pt to get out of bed and ambulate to bathroom and down hallway. VCs to navigate hallway. Assisted nursing getting patient dressed and in [facility] to D/C.   Recommendations for follow up therapy are one component of a multi-disciplinary discharge planning process, led by the attending physician.  Recommendations may be updated based on patient status, additional functional criteria and insurance authorization.  Follow Up Recommendations  Can patient physically be transported by private vehicle: Yes    Assistance Recommended at Discharge Frequent or constant Supervision/Assistance  Patient can return home with the following A little help with walking and/or transfers;A little help with bathing/dressing/bathroom;Assistance with cooking/housework;Assist for transportation;Help with stairs or ramp for entrance   Equipment Recommendations  Other (comment)    Recommendations for Other Services OT consult     Precautions / Restrictions Precautions Precautions: Fall Restrictions Weight Bearing Restrictions: No     Mobility  Bed Mobility Overal bed mobility: Modified Independent Bed Mobility: Supine to Sit, Sit to Supine     Supine to sit: Supervision Sit to supine: Supervision   General bed mobility comments: self able with use of rail    Transfers Overall transfer level: Needs assistance Equipment used: None Transfers: Sit to/from Stand Sit to Stand: Min assist           General transfer comment: Steady assist for balance and min assist to bring wt up and fwd to standing.  Also assisted with a toilet transfer.  VCs to reach for handrail next to toilet. Unsteady.  Poor self correction.  HIGH FALL RISK.     Ambulation/Gait Ambulation/Gait assistance: Min guard Gait Distance (Feet): 100 Feet Assistive device: None Gait Pattern/deviations: Step-through pattern, Decreased step length - right, Decreased step length - left, Shuffle, Trunk flexed Gait velocity: decreased     General Gait Details: trial amb without AD as prior.   Required MinAssist and VCs to navigate hallway.  Unsteady esp with turns and back steps with poor self correction.  HIGH FALL RISK.   Stairs             Wheelchair Mobility    Modified Rankin (Stroke Patients Only)       Balance                                            Cognition Arousal/Alertness: Awake/alert Behavior During Therapy: WFL for tasks assessed/performed Overall Cognitive Status: History of cognitive impairments - at baseline                                 General Comments: AxO x 1 pleasant/cooperative        Exercises      General Comments        Pertinent Vitals/Pain      Home Living                          Prior Function            PT Goals (current goals can now be found in the care  plan section) Acute Rehab PT Goals Patient Stated Goal: NO goals expressed but pt agreeable to participate with PT PT Goal Formulation: Patient unable to participate in goal setting Time For Goal Achievement: 10/16/22 Potential to Achieve Goals: Fair Progress towards PT goals: Progressing toward goals    Frequency    Min 3X/week      PT Plan Current plan remains appropriate    Co-evaluation              AM-PAC PT "6 Clicks" Mobility   Outcome Measure  Help needed turning from your back to your side while in a flat bed without using bedrails?: A Little Help needed moving from lying on your back to sitting on the side of a flat bed without using bedrails?: A Little Help needed moving to and from a bed to a chair (including a wheelchair)?: A Little Help needed standing up  from a chair using your arms (e.g., wheelchair or bedside chair)?: A Little Help needed to walk in hospital room?: A Little Help needed climbing 3-5 steps with a railing? : A Little 6 Click Score: 18    End of Session Equipment Utilized During Treatment: Gait belt Activity Tolerance: Patient tolerated treatment well Patient left: Other (comment)   PT Visit Diagnosis: Muscle weakness (generalized) (M62.81);Difficulty in walking, not elsewhere classified (R26.2);Unsteadiness on feet (R26.81)     Time: 1355-1420 PT Time Calculation (min) (ACUTE ONLY): 25 min  Charges:  $Gait Training: 8-22 mins $Therapeutic Activity: 8-22 mins                    Clayborne Artist, SPTA
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
## 2022-10-07 NOTE — Progress Notes (Signed)
This CSW received denial letter from [employer], Therapist, sports, with [employer] via email. This Careers adviser a copy and provided it to [place], Therapist, sports, who will provide it along with AVS to pt's grandson, Courtney Avila. Crystal, RN, also informed denial letter will be mailed to pt's address.   This Probation officer informed Courtney Avila once again via phone and Courtney Avila verbalized understanding that they have a right to appeal. No further TOC needs at this time.

## 2022-10-07 NOTE — ED Notes (Signed)
Pt. discharged with personal items MD and LSW made aware, forms attached to patient AVS.

## 2022-10-08 ENCOUNTER — Telehealth: Payer: Self-pay

## 2022-10-08 NOTE — Transitions of Care (Post Inpatient/ED Visit) (Signed)
   10/08/2022  Name: Courtney Avila MRN: [id number] DOB: 21-Jan-1941  Today's TOC FU Call Status: Today's TOC FU Call Status:: Unsuccessul Call (1st Attempt) Unsuccessful Call (1st Attempt) Date: 10/08/22  Attempted to reach the patient regarding the most recent Inpatient/ED visit.  Follow Up Plan: Additional outreach attempts will be made to reach the patient to complete the Transitions of Care (Post Inpatient/ED visit) call.   Signature : Haylee Mcanany.D/RMA

## 2022-10-09 ENCOUNTER — Telehealth: Payer: Self-pay | Admitting: Nurse Practitioner

## 2022-10-09 NOTE — Telephone Encounter (Signed)
Courtney Avila with [facility] called in to inform you that they have received the referral for this patient and reached out several times. Spoke to her son that told them he would call back when he is ready to schedule for them to come out.

## 2022-10-09 NOTE — Transitions of Care (Post Inpatient/ED Visit) (Unsigned)
   10/09/2022  Name: Courtney Avila MRN: [id number] DOB: [birth year]  Today's TOC FU Call Status: Today's TOC FU Call Status:: Unsuccessful Call (2nd Attempt) Unsuccessful Call (1st Attempt) Date: 10/08/22  Attempted to reach the patient regarding the most recent Inpatient/ED visit.  Follow Up Plan: Additional outreach attempts will be made to reach the patient to complete the Transitions of Care (Post Inpatient/ED visit) call.   Signature: Maris Abascal.D/RMA

## 2022-10-12 ENCOUNTER — Telehealth: Payer: Self-pay

## 2022-10-12 NOTE — Transitions of Care (Post Inpatient/ED Visit) (Signed)
   10/12/2022  Name: Courtney Avila MRN: [id number] DOB: 1940-09-20  Today's TOC FU Call Status: Today's TOC FU Call Status:: Unsuccessful Call (3rd Attempt) Unsuccessful Call (1st Attempt) Date: 10/08/22 Unsuccessful Call (3rd Attempt) Date: 10/12/22  Attempted to reach the patient regarding the most recent Inpatient/ED visit.  Follow Up Plan: No further outreach attempts will be made at this time. We have been unable to contact the patient.  Signature : Monigue Spraggins.D/RMA

## 2022-10-12 NOTE — Telephone Encounter (Signed)
Patient's son called regarding call he received for TOC. He stated that he didn't think it was necessary for patient to come inoffice for follow up visit since she has to see her psychiatrist on the 22nd. He states that if nothing was going to be accomplished by doing this visit he would just wait. He doesn't want to drag her out of the house for no reason.

## 2022-10-12 NOTE — Telephone Encounter (Signed)
        Patient  visited [place] on 4/3    Telephone encounter attempt :  1st  A HIPAA compliant voice message was left requesting a return call.  Instructed patient to call back     Courtney Avila [facility] Guide, [place] [phone] [address], [place], [place] [address] Phone: [phone] Email: [email or web address]       

## 2022-12-21 ENCOUNTER — Encounter: Payer: Self-pay | Admitting: Nurse Practitioner

## 2022-12-21 ENCOUNTER — Ambulatory Visit (INDEPENDENT_AMBULATORY_CARE_PROVIDER_SITE_OTHER): Payer: PPO | Admitting: Nurse Practitioner

## 2022-12-21 VITALS — BP 112/70 | HR 64 | Temp 97.8°F | Ht 59.0 in | Wt 113.0 lb

## 2022-12-21 DIAGNOSIS — D509 Iron deficiency anemia, unspecified: Secondary | ICD-10-CM

## 2022-12-21 DIAGNOSIS — M1991 Primary osteoarthritis, unspecified site: Secondary | ICD-10-CM

## 2022-12-21 DIAGNOSIS — E2839 Other primary ovarian failure: Secondary | ICD-10-CM

## 2022-12-21 DIAGNOSIS — F419 Anxiety disorder, unspecified: Secondary | ICD-10-CM | POA: Diagnosis not present

## 2022-12-21 DIAGNOSIS — F5101 Primary insomnia: Secondary | ICD-10-CM

## 2022-12-21 DIAGNOSIS — F01B4 Vascular dementia, moderate, with anxiety: Secondary | ICD-10-CM

## 2022-12-21 DIAGNOSIS — R251 Tremor, unspecified: Secondary | ICD-10-CM

## 2022-12-21 NOTE — Progress Notes (Signed)
Careteam: Patient Care Team: Sharon Seller, NP as PCP - General (Geriatric Medicine) Sharon Seller, NP (Geriatric Medicine)  PLACE OF SERVICE:  [facility]  Advanced Directive information    No Known Allergies  Chief Complaint  Patient presents with   Medical Management of Chronic Issues    3 month follow-up. Discuss need for AWV, covid boosters, shingrix, and DEXA or post pone. Here with son, [name]. Weight loss, patient does not feel like eating at times, drinks ensure.      HPI: Patient is a 82 y.o. female for routine follow up.   Seeing the psychiatrist having hallucinations all the time now- trying to get her some medication to help with this that is affordable.   Has had weight loss because she is having a lot of paranoia thinking the food is poisoned. She is drinking ensure. Does not eat much. This has happened before and son hopes that once medication is on board this will improve.  One week she will have a lot of behaviors and other weeks will be well controlled.   Anemia- noted in ED, no dark tarry stools.   No pain at this time.    Review of Systems:  Review of Systems  Unable to perform ROS: Dementia    Past Medical History:  Diagnosis Date   Allergy    Anxiety    Arthritis    Dementia (HCC)    GERD (gastroesophageal reflux disease)    Past Surgical History:  Procedure Laterality Date   ABDOMINAL HYSTERECTOMY     ANKLE SURGERY     growth lipoma removed   COLONOSCOPY     MULTIPLE TOOTH EXTRACTIONS     REVERSE SHOULDER ARTHROPLASTY Right 04/09/2016   REVERSE SHOULDER ARTHROPLASTY Right 04/09/2016   Procedure: RIGHT REVERSE SHOULDER ARTHROPLASTY;  Surgeon: Francena Hanly, MD;  Location: [facility] OR;  Service: Orthopedics;  Laterality: Right;   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 She has never used smokeless tobacco. She reports that she does not drink alcohol and does not use drugs.  Family History  Problem Relation Age of Onset   Diabetes Mother    Hypertension Mother    Heart disease Father    Colon cancer Father    Breast cancer Sister     Medications: Patient's Medications  New Prescriptions   No medications on file  Previous Medications   BUSPIRONE (BUSPAR) 10 MG TABLET    Take 1 tablet (10 mg total) by mouth 2 (two) times daily.   CYANOCOBALAMIN (VITAMIN B12) 1000 MCG TABLET    Take 1 tablet (1,000 mcg total) by mouth daily.   ENSURE (ENSURE)    Take 237 mLs by mouth daily.   MULTIPLE VITAMINS-MINERALS (HAIR SKIN AND NAILS FORMULA PO)    Take 1 tablet by mouth daily.   PAROXETINE (PAXIL) 40 MG TABLET    Take 40 mg by mouth at bedtime.   PRAZOSIN (MINIPRESS) 1 MG CAPSULE    Take 2 mg by mouth at bedtime.   TEMAZEPAM (RESTORIL) 30 MG CAPSULE    Take 30 mg by mouth at bedtime.   TYLENOL 500 MG TABLET    Take 500 mg by mouth every 6 (six) hours as needed for mild pain or headache.  Modified Medications   No medications  on file  Discontinued Medications   BUSPIRONE (BUSPAR) 10 MG TABLET    Take 10 mg by mouth 2 (two) times daily.   PAROXETINE (PAXIL) 40 MG TABLET    Take 40 mg by mouth in the morning.   PRAZOSIN (MINIPRESS) 1 MG CAPSULE    Take by mouth.   TEMAZEPAM (RESTORIL) 30 MG CAPSULE    Take 30 mg by mouth at bedtime.    Physical Exam:  Vitals:   12/21/22 1333  BP: 112/70  Pulse: 64  Temp: 97.8 F (36.6 C)  TempSrc: Temporal  SpO2: 99%  Weight: 113 lb (51.3 kg)  Height: 4\' 11"  (1.499 m)   Body mass index is 22.82 kg/m. Wt Readings from Last 3 Encounters:  12/21/22 113 lb (51.3 kg)  09/21/22 128 lb (58.1 kg)  08/04/22 132 lb 4.4 oz (60 kg)    Physical Exam Constitutional:      General: She is not in acute distress.    Appearance: She is well-developed. She is not diaphoretic.     Comments: Thin     HENT:     Head: Normocephalic and atraumatic.     Mouth/Throat:     Pharynx: No oropharyngeal exudate.  Eyes:     Conjunctiva/sclera: Conjunctivae normal.     Pupils: Pupils are equal, round, and reactive to light.  Cardiovascular:     Rate and Rhythm: Normal rate and regular rhythm.     Heart sounds: Normal heart sounds.  Pulmonary:     Effort: Pulmonary effort is normal.     Breath sounds: Normal breath sounds.  Abdominal:     General: Bowel sounds are normal.     Palpations: Abdomen is soft.  Musculoskeletal:     Cervical back: Normal range of motion and neck supple.     Right lower leg: No edema.     Left lower leg: No edema.  Skin:    General: Skin is warm and dry.  Neurological:     Mental Status: She is alert.  Psychiatric:        Mood and Affect: Mood normal.     Labs reviewed: Basic Metabolic Panel: Recent Labs    05/04/22 1135 08/04/22 2349 09/21/22 1508 10/02/22 2258  NA 142 140 141 138  K 4.7 3.5 4.6 3.8  CL 106 110 106 107  CO2 22 18* 27 21*  GLUCOSE 82 99 94 103*  BUN 16 25* 28* 40*  CREATININE 1.01* 1.35* 0.95 1.00  CALCIUM 9.6 9.5 9.4 9.2  TSH 2.53 5.745*  --  2.365   Liver Function Tests: Recent Labs    08/04/22 2349 09/21/22 1508 10/02/22 2258  AST 37 21 39  ALT 14 14 20   ALKPHOS 48  --  57  BILITOT 0.9 0.3 0.6  PROT 7.0 6.5 6.6  ALBUMIN 3.8  --  3.8   No results for input(s): "LIPASE", "AMYLASE" in the last 8760 hours. No results for input(s): "AMMONIA" in the last 8760 hours. CBC: Recent Labs    08/04/22 2349 09/21/22 1508 10/02/22 2258  WBC 9.4 6.9 9.0  NEUTROABS 4.7 4,023 5.1  HGB 11.9* 12.0 10.7*  HCT 35.2* 36.5 32.9*  MCV 88.7 90.6 93.7  PLT 277 291 232   Lipid Panel: No results for input(s): "CHOL", "HDL", "LDLCALC", "TRIG", "CHOLHDL", "LDLDIRECT" in the last 8760 hours. TSH: Recent Labs    05/04/22 1135 08/04/22 2349 10/02/22 2258  TSH 2.53 5.745* 2.365   A1C: Lab Results  Component Value Date  HGBA1C  5.9 (H) 03/24/2015     Assessment/Plan 1. Primary osteoarthritis, unspecified site -stable, no reports of pain at this time  2. Iron deficiency anemia, unspecified iron deficiency anemia type -follow up lab at this time - CBC with Differential/Platelet  3. Anxiety Ongoing, followed by psychiatrist, continues on paxil.   4. Primary insomnia -stable, continues on temazepam and prazosin per psych.   5. Tremor -stable at this time  6. Moderate vascular dementia with anxiety (HCC) -Stable, no acute changes in cognitive or functional status, continue supportive care.   7. Estrogen deficiency - DG Bone Density; Future   Return in about 3 months (around 03/23/2023) for weight .  Janene Harvey. Biag[facility] [phone]

## 2022-12-21 NOTE — Patient Instructions (Signed)
Liberalized diet.  2 supplements a day if she is not eating well.  Ideally 3 meals a day with supplement between meals.

## 2022-12-22 LAB — CBC WITH DIFFERENTIAL/PLATELET
Absolute Monocytes: 388 cells/uL (ref 200–950)
Basophils Absolute: 41 cells/uL (ref 0–200)
Basophils Relative: 0.8 %
Eosinophils Absolute: 138 cells/uL (ref 15–500)
Eosinophils Relative: 2.7 %
HCT: 37.1 % (ref 35.0–45.0)
Hemoglobin: 12.2 g/dL (ref 11.7–15.5)
Lymphs Abs: 1938 cells/uL (ref 850–3900)
MCH: 29.9 pg (ref 27.0–33.0)
MCHC: 32.9 g/dL (ref 32.0–36.0)
MCV: 90.9 fL (ref 80.0–100.0)
MPV: 9.7 fL (ref 7.5–12.5)
Monocytes Relative: 7.6 %
Neutro Abs: 2596 cells/uL (ref 1500–7800)
Neutrophils Relative %: 50.9 %
Platelets: 248 10*3/uL (ref 140–400)
RBC: 4.08 10*6/uL (ref 3.80–5.10)
RDW: 13.1 % (ref 11.0–15.0)
Total Lymphocyte: 38 %
WBC: 5.1 10*3/uL (ref 3.8–10.8)

## 2023-01-11 ENCOUNTER — Telehealth: Payer: PPO | Admitting: Nurse Practitioner

## 2023-02-01 ENCOUNTER — Telehealth: Payer: PPO | Admitting: Nurse Practitioner

## 2023-02-04 ENCOUNTER — Encounter: Payer: Self-pay | Admitting: Nurse Practitioner

## 2023-02-04 ENCOUNTER — Ambulatory Visit (INDEPENDENT_AMBULATORY_CARE_PROVIDER_SITE_OTHER): Payer: PPO | Admitting: Nurse Practitioner

## 2023-02-04 DIAGNOSIS — Z0000 Encounter for general adult medical examination without abnormal findings: Secondary | ICD-10-CM

## 2023-02-04 NOTE — Patient Instructions (Signed)
  Ms. Randleman , Thank you for taking time to come for your Medicare Wellness Visit. I appreciate your ongoing commitment to your health goals. Please review the following plan we discussed and let me know if I can assist you in the future.    This is a list of the screening recommended for you and due dates:  Health Maintenance  Topic Date Due   DEXA scan (bone density measurement)  Never done   Flu Shot  02/04/2023   Zoster (Shingles) Vaccine (1 of 2) 07/06/2028*   Medicare Annual Wellness Visit  02/04/2024   DTaP/Tdap/Td vaccine (2 - Td or Tdap) 10/02/2032   Pneumonia Vaccine  Completed   HPV Vaccine  Aged Out   COVID-19 Vaccine  Discontinued  *Topic was postponed. The date shown is not the original due date.

## 2023-02-04 NOTE — Progress Notes (Signed)
Subjective:   Courtney Avila is a 82 y.o. female who presents for Medicare Annual (Subsequent) preventive examination.  Visit Complete: Virtual  I connected with  Courtney Avila on 02/04/23 by a video and audio enabled telemedicine application and verified that I am speaking with the correct person using two identifiers.  Patient Location: Home  Provider Location: Office/Clinic  I discussed the limitations of evaluation and management by telemedicine. The patient expressed understanding and agreed to proceed.  Patient Medicare AWV questionnaire was completed by the patient on 8/1; I have confirmed that all information answered by patient is correct and no changes since this date.  Review of Systems     Cardiac Risk Factors include: advanced age (>53men, >48 women)     Objective:    There were no vitals filed for this visit. There is no height or weight on file to calculate BMI.     02/04/2023   10:55 AM 10/02/2022    9:36 PM 09/21/2022    2:37 PM 08/04/2022    8:40 PM 05/27/2022   10:14 AM 07/24/2019    4:16 PM 07/09/2019    2:08 AM  Advanced Directives  Does Patient Have a Medical Advance Directive? Yes No Yes No No No No  Type of Advance Directive Healthcare Power of [employer] Power of Attorney      Does patient want to make changes to medical advance directive? No - Patient declined  No - Patient declined      Copy of Healthcare Power of Attorney in Chart? No - copy requested  No - copy requested      Would patient like information on creating a medical advance directive?  No - Patient declined  No - Patient declined   No - Patient declined    Current Medications (verified) Outpatient Encounter Medications as of 02/04/2023  Medication Sig   ARIPiprazole (ABILIFY) 5 MG tablet Take 5 mg by mouth daily.   busPIRone (BUSPAR) 10 MG tablet Take 1 tablet (10 mg total) by mouth 2 (two) times daily.   cyanocobalamin (VITAMIN B12) 1000 MCG tablet Take 1 tablet (1,000 mcg  total) by mouth daily.   Ensure (ENSURE) Take 237 mLs by mouth daily.   Multiple Vitamins-Minerals (HAIR SKIN AND NAILS FORMULA PO) Take 1 tablet by mouth daily.   PARoxetine (PAXIL) 40 MG tablet Take 40 mg by mouth at bedtime.   prazosin (MINIPRESS) 1 MG capsule Take 2 mg by mouth at bedtime.   temazepam (RESTORIL) 30 MG capsule Take 30 mg by mouth at bedtime.   TYLENOL 500 MG tablet Take 500 mg by mouth every 6 (six) hours as needed for mild pain or headache.   No facility-administered encounter medications on file as of 02/04/2023.    Allergies (verified) Patient has no known allergies.   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  Procedure: RIGHT TOTAL KNEE ARTHROPLASTY;  Surgeon: Ollen Gross, MD;  Location: [facility];  Service: Orthopedics;  Laterality: Right;   Family History  Problem Relation Age of Onset   Diabetes Mother    Hypertension Mother    Heart disease Father    Colon cancer Father    Breast cancer Sister    Social History   Socioeconomic History   Marital status: Divorced    Spouse name: Not on file   Number of children: Not on file   Years of education: Not on file   Highest education level: Not on file  Occupational History   Not on file  Tobacco Use   Smoking status: Never   Smokeless tobacco: Never  Vaping Use   Vaping status: Never Used  Substance and Sexual Activity   Alcohol use: Never   Drug use:  Never   Sexual activity: Not Currently  Other Topics Concern   Not on file  Social History Narrative   ** Merged History Encounter **       Right handed Lives with her son Courtney Avila Two story home Drinks no caffeine retired   International aid/development worker of [employer] investment [employer] Strain: Not on file  Food Insecurity: Not on file  Transportation Needs: Not on file  Physical Activity: Not on file  Stress: Not on file  Social Connections: Not on file    Tobacco Counseling Counseling given: Not Answered   Clinical Intake:  Pre-visit preparation completed: Yes  Pain : No/denies pain     BMI - recorded: 22 Nutritional Status: BMI of 19-24  Normal Nutritional Risks: Unintentional weight loss  How often do you need to have someone help you when you read instructions, pamphlets, or other written materials from your doctor or pharmacy?: 5 - Always         Activities of Daily Living    02/04/2023   11:39 AM  In your present state of health, do you have any difficulty performing the following activities:  Hearing? 0  Vision? 0  Difficulty concentrating or making decisions? 1  Walking or climbing stairs? 1  Dressing or bathing? 1  Doing errands, shopping? 1  Preparing Food and eating ? Y  Using the Toilet? N  In the past six months, have you accidently leaked urine? Y  Do you have problems with loss of bowel control? Y  Managing your Medications? Y  Managing your Finances? Y  Housekeeping or managing your Housekeeping? Y    Patient Care Team: Sharon Seller, NP as PCP - General (Geriatric Medicine) Sharon Seller, NP (Geriatric Medicine)  Indicate any recent Medical Services you may have received from other than [facility] providers in the past year (date may be approximate).     Assessment:   This is a routine wellness examination for Courtney Avila.  Hearing/Vision screen Vision Screening - Comments:: [facility]  Last Exam: 2022  Dietary issues and exercise  activities discussed:     Goals Addressed   None    Depression Screen    02/04/2023   10:55 AM 12/21/2022    2:05 PM 11/02/2016    3:35 PM 03/24/2015    3:48 PM  PHQ 2/9 Scores  PHQ - 2 Score 0 0 0 4  PHQ- 9 Score    13  Exception Documentation  Other- indicate reason in comment box    Not completed  Established with [facility]      Fall Risk    02/04/2023  10:55 AM 12/21/2022    2:05 PM 09/21/2022    2:36 PM 05/27/2022   10:13 AM 05/04/2022   10:48 AM  Fall Risk   Falls in the past year? 1 1 1 1 1   Number falls in past yr: 1 0 1 1 1   Injury with Fall? 0 0 0 0 1  Risk for fall due to :  No Fall Risks History of fall(s);Impaired balance/gait  History of fall(s)  Follow up  Falls evaluation completed Falls evaluation completed      MEDICARE RISK AT HOME:   TIMED UP AND GO:  Was the test performed?  No    Cognitive Function:    05/27/2022   10:00 AM 05/04/2022   11:01 AM  MMSE - Mini Mental State Exam  Orientation to time 0 1  Orientation to Place 0 2  Registration 0 3  Attention/ Calculation 0 1  Recall 0 0  Language- name 2 objects 2 2  Language- repeat 1 1  Language- follow 3 step command 0 2  Language- read & follow direction 1 1  Write a sentence 0 0  Copy design 0 0  Total score 4 13        02/04/2023   10:58 AM  6CIT Screen  What Year? 4 points  What month? 3 points  What time? 3 points  Count back from 20 2 points  Months in reverse 4 points  Repeat phrase 10 points  Total Score 26 points    Immunizations Immunization History  Administered Date(s) Administered   PNEUMOCOCCAL CONJUGATE-20 05/04/2022   Tdap 10/03/2022    TDAP status: Up to date  Flu Vaccine status: Due, Education has been provided regarding the importance of this vaccine. Advised may receive this vaccine at local pharmacy or Health Dept. Aware to provide a copy of the vaccination record if obtained from local pharmacy or Health Dept. Verbalized acceptance and  understanding.  Pneumococcal vaccine status: Up to date  Covid-19 vaccine status: Information provided on how to obtain vaccines.   Qualifies for Shingles Vaccine? Yes   Zostavax completed No   Shingrix Completed?: No.    Education has been provided regarding the importance of this vaccine. Patient has been advised to call insurance company to determine out of pocket expense if they have not yet received this vaccine. Advised may also receive vaccine at local pharmacy or Health Dept. Verbalized acceptance and understanding.  Screening Tests Health Maintenance  Topic Date Due   DEXA SCAN  Never done   INFLUENZA VACCINE  02/04/2023   Zoster Vaccines- Shingrix (1 of 2) 07/06/2028 (Originally 03/04/1991)   Medicare Annual Wellness (AWV)  02/04/2024   DTaP/Tdap/Td (2 - Td or Tdap) 10/02/2032   Pneumonia Vaccine 72+ Years old  Completed   HPV VACCINES  Aged Out   COVID-19 Vaccine  Discontinued    Health Maintenance  Health Maintenance Due  Topic Date Due   DEXA SCAN  Never done   INFLUENZA VACCINE  02/04/2023    Colorectal cancer screening: No longer required.   Mammogram status: No longer required due to age.  Bone Density status: Ordered and recommended to call. Pt provided with contact info and advised to call to schedule appt.  Lung Cancer Screening: (Low Dose CT Chest recommended if Age 2-80 years, 20 pack-year currently smoking OR have quit w/in 15years.) does not qualify.   Lung Cancer Screening Referral: na  Additional Screening:  Hepatitis C Screening: does not qualify  Vision Screening:  Recommended annual ophthalmology exams for early detection of glaucoma and other disorders of the eye. Is the patient up to date with their annual eye exam?  No  Who is the provider or what is the name of the office in which the patient attends annual eye exams? [facility] If pt is not established with a provider, would they like to be referred to a provider to  establish care? No .   Dental Screening: Recommended annual dental exams for proper oral hygiene   Community Resource Referral / Chronic Care Management: CRR required this visit?  No   CCM required this visit?  Appt scheduled with PCP     Plan:     I have personally reviewed and noted the following in the patient's chart:   Medical and social history Use of alcohol, tobacco or illicit drugs  Current medications and supplements including opioid prescriptions. Patient is not currently taking opioid prescriptions. Functional ability and status Nutritional status Physical activity Advanced directives List of other physicians Hospitalizations, surgeries, and ER visits in previous 12 months Vitals Screenings to include cognitive, depression, and falls Referrals and appointments  In addition, I have reviewed and discussed with patient certain preventive protocols, quality metrics, and best practice recommendations. A written personalized care plan for preventive services as well as general preventive health recommendations were provided to patient.     Sharon Seller, NP   02/04/2023   After Visit Summary: (MyChart) Due to this being a telephonic visit, the after visit summary with patients personalized plan was offered to patient via MyChart

## 2023-02-04 NOTE — Progress Notes (Signed)
  This service is provided via telemedicine  No vital signs collected/recorded due to the encounter was a telemedicine visit.   Location of patient (ex: home, work):  Home  Patient consents to a telephone visit:  Yes  Location of the provider (ex: office, home):  Office [place].   Name of any referring provider:  na  Names of all persons participating in the telemedicine service and their role in the encounter:  Courtney Avila, Patient, [name], Son, [name], CMA, Abbey Chatters, NP  Time spent on call:  7:17

## 2023-03-22 ENCOUNTER — Ambulatory Visit: Payer: PPO | Admitting: Nurse Practitioner

## 2023-04-05 ENCOUNTER — Ambulatory Visit (INDEPENDENT_AMBULATORY_CARE_PROVIDER_SITE_OTHER): Payer: PPO | Admitting: Nurse Practitioner

## 2023-04-05 ENCOUNTER — Encounter: Payer: Self-pay | Admitting: Nurse Practitioner

## 2023-04-05 VITALS — BP 121/88 | HR 67 | Temp 95.3°F | Resp 18 | Ht 59.0 in | Wt 116.2 lb

## 2023-04-05 DIAGNOSIS — Z23 Encounter for immunization: Secondary | ICD-10-CM

## 2023-04-05 DIAGNOSIS — F01B4 Vascular dementia, moderate, with anxiety: Secondary | ICD-10-CM

## 2023-04-05 DIAGNOSIS — R634 Abnormal weight loss: Secondary | ICD-10-CM

## 2023-04-05 NOTE — Progress Notes (Signed)
Careteam: Patient Care Team: Courtney Seller, NP as PCP - General (Geriatric Medicine) Courtney Seller, NP (Geriatric Medicine)  PLACE OF SERVICE:  [facility]  Advanced Directive information Does Patient Have a Medical Advance Directive?: Yes, Type of Advance Directive: Healthcare Power of Attorney, Does patient want to make changes to medical advance directive?: No - Patient declined  No Known Allergies  Chief Complaint  Patient presents with   Follow-up    3 mth fu for weight   Immunizations    Influenza    HPI: Patient is a 82 y.o. female presents for a 13-month follow-up for weight loss.  Reports eating 3 meals a day, sometimes snacking in between meals. Her grand-daughter is cooking for her. Not having issues with paranoia, reports she feels like her food is safe to eat. Denies trouble swallowing.  Feels like her mood is okay, grandson accompanying patient today, but he is not very familiar with her history.  Reports she is sleeping well.  No concerns at this time.   Review of Systems:  Review of Systems  Constitutional:  Negative for chills, fever and weight loss.  Respiratory:  Negative for cough and shortness of breath.   Cardiovascular:  Negative for chest pain and palpitations.  Gastrointestinal:  Negative for constipation, diarrhea, nausea and vomiting.  Genitourinary:  Negative for dysuria, frequency and urgency.  Neurological:  Negative for dizziness, weakness and headaches.  Psychiatric/Behavioral:  Positive for memory loss (stable).     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 She has never used smokeless tobacco. She reports that she does not drink alcohol and does not use drugs.  Family History  Problem Relation Age of Onset   Diabetes Mother    Hypertension Mother    Heart disease Father    Colon cancer Father    Breast cancer Sister     Medications: Patient's Medications  New Prescriptions   No medications on file  Previous Medications   ARIPIPRAZOLE (ABILIFY) 5 MG TABLET    Take 5 mg by mouth daily.   BUSPIRONE (BUSPAR) 10 MG TABLET    Take 1 tablet (10 mg total) by mouth 2 (two) times daily.   CYANOCOBALAMIN (VITAMIN B12) 1000 MCG TABLET    Take 1 tablet (1,000 mcg total) by mouth daily.   ENSURE (ENSURE)    Take 237 mLs by mouth daily.   MULTIPLE VITAMINS-MINERALS (HAIR SKIN AND NAILS FORMULA PO)    Take 1 tablet by mouth daily.   PAROXETINE (PAXIL) 40 MG TABLET    Take 40 mg by mouth at bedtime.   PRAZOSIN (MINIPRESS) 1 MG CAPSULE    Take 2 mg by mouth at  bedtime.   TEMAZEPAM (RESTORIL) 30 MG CAPSULE    Take 30 mg by mouth at bedtime.   TYLENOL 500 MG TABLET    Take 500 mg by mouth every 6 (six) hours as needed for mild pain or headache.  Modified Medications   No medications on file  Discontinued Medications   No medications on file    Physical Exam:  Vitals:   04/05/23 1527  BP: 121/88  Pulse: 67  Resp: 18  Temp: (!) 95.3 F (35.2 C)  SpO2: 98%  Weight: 116 lb 3.2 oz (52.7 kg)  Height: 4\' 11"  (1.499 m)   Body mass index is 23.47 kg/m. Wt Readings from Last 3 Encounters:  04/05/23 116 lb 3.2 oz (52.7 kg)  12/21/22 113 lb (51.3 kg)  09/21/22 128 lb (58.1 kg)     Physical Exam Constitutional:      Appearance: Normal appearance. She is normal weight. She is not ill-appearing or toxic-appearing.  Cardiovascular:     Rate and Rhythm: Normal rate and regular rhythm.     Pulses: Normal pulses.     Heart sounds: Normal heart sounds.  Pulmonary:     Effort: Pulmonary effort is normal.     Breath sounds: Normal breath sounds.  Abdominal:     General: Bowel sounds are normal.     Palpations: Abdomen is soft.  Skin:    General: Skin is warm and dry.  Neurological:     Mental Status: She is alert. Mental status is at baseline.  Psychiatric:        Behavior: Behavior normal.     Comments: At baseline     Labs reviewed: Basic Metabolic Panel: Recent Labs    05/04/22 1135 08/04/22 2349 09/21/22 1508 10/02/22 2258  NA 142 140 141 138  K 4.7 3.5 4.6 3.8  CL 106 110 106 107  CO2 22 18* 27 21*  GLUCOSE 82 99 94 103*  BUN 16 25* 28* 40*  CREATININE 1.01* 1.35* 0.95 1.00  CALCIUM 9.6 9.5 9.4 9.2  TSH 2.53 5.745*  --  2.365   Liver Function Tests: Recent Labs    08/04/22 2349 09/21/22 1508 10/02/22 2258  AST 37 21 39  ALT 14 14 20   ALKPHOS 48  --  57  BILITOT 0.9 0.3 0.6  PROT 7.0 6.5 6.6  ALBUMIN 3.8  --  3.8   No results for input(s): "LIPASE", "AMYLASE" in the last 8760 hours. No results for input(s): "AMMONIA" in the last 8760 hours. CBC: Recent Labs    09/21/22 1508 10/02/22 2258 12/21/22 1430  WBC 6.9 9.0 5.1  NEUTROABS 4,023 5.1 2,596  HGB 12.0 10.7* 12.2  HCT 36.5 32.9* 37.1  MCV 90.6 93.7 90.9  PLT 291 232 248   Lipid Panel: No results for input(s): "CHOL", "HDL", "LDLCALC", "TRIG", "CHOLHDL", "LDLDIRECT" in the last 8760 hours. TSH: Recent Labs    05/04/22 1135 08/04/22 2349 10/02/22 2258  TSH 2.53 5.745* 2.365   A1C: Lab Results  Component Value Date   HGBA1C 5.9 (H) 03/24/2015   Assessment/Plan 1. Weight loss -Stable -Gained 3lbs since last visit -Continue high-calorie/high-protein meals;  snacks in between meals -- incorporate healthier options as able  2. Moderate vascular dementia with anxiety (HCC) -Stable, no changes in mood -Continue care with psychiatry   3. Need for influenza vaccination - Flu Vaccine Trivalent High Dose (Fluad)  Return in about 4 months (around 08/05/2023) for routine follow up.  Rollen Sox, FNP-MSN Student -I personally  was present during the history, physical exam and medical decision-making activities of this service and have verified that the service and findings are accurately documented in the student's note Abbey Chatters, NP

## 2023-07-12 ENCOUNTER — Encounter: Payer: Self-pay | Admitting: Nurse Practitioner

## 2023-07-12 ENCOUNTER — Ambulatory Visit (INDEPENDENT_AMBULATORY_CARE_PROVIDER_SITE_OTHER): Payer: PPO | Admitting: Nurse Practitioner

## 2023-07-12 VITALS — BP 110/68 | HR 60 | Temp 97.9°F | Resp 17 | Ht 59.0 in | Wt 115.8 lb

## 2023-07-12 DIAGNOSIS — R634 Abnormal weight loss: Secondary | ICD-10-CM | POA: Diagnosis not present

## 2023-07-12 DIAGNOSIS — F5101 Primary insomnia: Secondary | ICD-10-CM

## 2023-07-12 DIAGNOSIS — M1991 Primary osteoarthritis, unspecified site: Secondary | ICD-10-CM | POA: Diagnosis not present

## 2023-07-12 DIAGNOSIS — F01B4 Vascular dementia, moderate, with anxiety: Secondary | ICD-10-CM | POA: Diagnosis not present

## 2023-07-12 DIAGNOSIS — D509 Iron deficiency anemia, unspecified: Secondary | ICD-10-CM | POA: Diagnosis not present

## 2023-07-12 DIAGNOSIS — M7989 Other specified soft tissue disorders: Secondary | ICD-10-CM

## 2023-07-12 DIAGNOSIS — F419 Anxiety disorder, unspecified: Secondary | ICD-10-CM

## 2023-07-12 NOTE — Progress Notes (Signed)
 Careteam: Patient Care Team: [name], NP as PCP - General (Geriatric Medicine) [name], NP (Geriatric Medicine)  PLACE OF SERVICE:  [facility]  Advanced Directive information Does Patient Have a Medical Advance Directive?: No, Would patient like information on creating a medical advance directive?: No - Patient declined  No Known Allergies  Chief Complaint  Patient presents with   Medical Management of Chronic Issues    4 month follow up and discuss dexa scan     HPI: Patient is a 83 y.o. female for routine follow up Here today with granddaughter She reports she eats well. Pt reports she is dizzy and will fall out of the bed when she first wakes up.   She she's a psychiatrist who manages her medication for her dementia with behaviors. She was having hallucinations and paranoia.  Behaviors have gotten better since her labii was increased to 5 mg  She sleeps well at night  Granddaughter noticed she was sleeping on her left hand when she got her this morning and now swollen. No injury noted or reported. Pt denies pain.   Review of Systems:  Review of Systems  Constitutional:  Negative for chills, fever and weight loss.  HENT:  Negative for tinnitus.   Respiratory:  Negative for cough, sputum production and shortness of breath.   Cardiovascular:  Negative for chest pain, palpitations and leg swelling.  Gastrointestinal:  Negative for abdominal pain, constipation, diarrhea and heartburn.  Genitourinary:  Negative for dysuria, frequency and urgency.  Musculoskeletal:  Negative for back pain, falls, joint pain and myalgias.  Skin: Negative.   Neurological:  Positive for dizziness. Negative for headaches.  Psychiatric/Behavioral:  Positive for memory loss. Negative for depression. The patient does not have insomnia.     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 She has never used smokeless tobacco. She reports that she does not drink alcohol and does not use drugs.  Family History  Problem Relation Age of Onset   Diabetes Mother    Hypertension Mother    Heart disease Father    Colon cancer Father    Breast cancer Sister     Medications: Patient's Medications  New Prescriptions   No medications on file  Previous Medications   ARIPIPRAZOLE (ABILIFY) 5 MG TABLET    Take 5 mg by mouth daily.   BUSPIRONE  (BUSPAR ) 10 MG TABLET    Take 1 tablet (10 mg total) by mouth 2 (two) times daily.   CYANOCOBALAMIN (VITAMIN B12) 1000 MCG TABLET    Take 1 tablet (1,000 mcg total) by mouth daily.   ENSURE (ENSURE)    Take 237 mLs by mouth daily.  MULTIPLE VITAMINS-MINERALS (HAIR SKIN AND NAILS FORMULA PO)    Take 1 tablet by mouth daily.   PAROXETINE  (PAXIL ) 40 MG TABLET    Take 40 mg by mouth at bedtime.   PRAZOSIN  (MINIPRESS ) 1 MG CAPSULE    Take 2 mg by mouth at bedtime.   TEMAZEPAM  (RESTORIL ) 30 MG CAPSULE    Take 30 mg by mouth at bedtime.   TYLENOL  500 MG TABLET    Take 500 mg by mouth every 6 (six) hours as needed for mild pain or headache.  Modified Medications   No medications on file  Discontinued Medications   No medications on file    Physical Exam:  Vitals:   07/12/23 1540  BP: 110/68  Pulse:  60  Resp: 17  Temp: 97.9 F (36.6 C)  SpO2: 96%  Weight: 115 lb 12.8 oz (52.5 kg)  Height: 4' 11 (1.499 m)   Body mass index is 23.39 kg/m. Wt Readings from Last 3 Encounters:  07/12/23 115 lb 12.8 oz (52.5 kg)  04/05/23 116 lb 3.2 oz (52.7 kg)  12/21/22 113 lb (51.3 kg)    Physical Exam Constitutional:      General: She is not in acute distress.    Appearance: She is well-developed. She is not diaphoretic.  HENT:     Head: Normocephalic and atraumatic.     Mouth/Throat:     Pharynx: No oropharyngeal exudate.  Eyes:     Conjunctiva/sclera: Conjunctivae normal.     Pupils: Pupils are equal, round, and reactive to light.  Cardiovascular:     Rate and Rhythm: Normal rate and regular rhythm.     Heart sounds: Normal heart sounds.  Pulmonary:     Effort: Pulmonary effort is normal.     Breath sounds: Normal breath sounds.  Abdominal:     General: Bowel sounds are normal.     Palpations: Abdomen is soft.  Musculoskeletal:     Cervical back: Normal range of motion and neck supple.     Right lower leg: No edema.     Left lower leg: No edema.  Skin:    General: Skin is warm and dry.  Neurological:     Mental Status: She is alert.  Psychiatric:        Mood and Affect: Mood normal.     Labs reviewed: Basic Metabolic Panel: Recent Labs    08/04/22 2349 09/21/22 1508 10/02/22 2258  NA 140 141 138  K 3.5 4.6 3.8  CL 110 106 107  CO2 18* 27 21*  GLUCOSE 99 94 103*  BUN 25* 28* 40*  CREATININE 1.35* 0.95 1.00  CALCIUM 9.5 9.4 9.2  TSH 5.745*  --  2.365   Liver Function Tests: Recent Labs    08/04/22 2349 09/21/22 1508 10/02/22 2258  AST 37 21 39  ALT 14 14 20   ALKPHOS 48  --  57  BILITOT 0.9 0.3 0.6  PROT 7.0 6.5 6.6  ALBUMIN 3.8  --  3.8   No results for input(s): LIPASE, AMYLASE in the last 8760 hours. No results for input(s): AMMONIA in the last 8760 hours. CBC: Recent Labs    09/21/22 1508 10/02/22 2258 12/21/22 1430  WBC 6.9 9.0 5.1   NEUTROABS 4,023 5.1 2,596  HGB 12.0 10.7* 12.2  HCT 36.5 32.9* 37.1  MCV 90.6 93.7 90.9  PLT 291 232 248   Lipid Panel: No results for input(s): CHOL, HDL, LDLCALC, TRIG, CHOLHDL, LDLDIRECT in the last 8760 hours. TSH: Recent  Labs    08/04/22 2349 10/02/22 2258  TSH 5.745* 2.365   A1C: Lab Results  Component Value Date   HGBA1C 5.9 (H) 03/24/2015     Assessment/Plan 1. Weight loss (Primary) Has been stable encouraged to liberalize diet. To have protein supplement in addition to smallest meal of the day. - COMPLETE METABOLIC PANEL WITH GFR - CBC with Differential/Platelet  2. Moderate vascular dementia with anxiety (HCC) -Stable, no acute changes in cognitive or functional status, continue supportive care.   3. Primary osteoarthritis, unspecified site Stable, tylenol  PRN  4. Iron deficiency anemia, unspecified iron deficiency anemia type Stable on last labs - COMPLETE METABOLIC PANEL WITH GFR - CBC with Differential/Platelet  5. Primary insomnia Doing well, no trouble sleeping, she is on temazepam - discussed GDR to discuss with psychiatrist   6. Anxiety Stable on buspar  and paxil    7. Swelling of left hand - granddaughter reports she woke up with hand swelling She was sleeping on hand Will have her elevate and monitor  No pain or redness noted. Full ROM  Return in about 6 months (around 01/09/2024) for routine follow up .  Donnamaria Shands K. Caro BODILY [facility] [phone]

## 2023-07-12 NOTE — Patient Instructions (Addendum)
 Ask about coming down on the temazepam she can take 15 mg- and possibly stopping if she tolerates a dose reduction.

## 2023-07-13 LAB — CBC WITH DIFFERENTIAL/PLATELET
Absolute Lymphocytes: 1358 {cells}/uL (ref 850–3900)
Absolute Monocytes: 672 {cells}/uL (ref 200–950)
Basophils Absolute: 42 {cells}/uL (ref 0–200)
Basophils Relative: 0.6 %
Eosinophils Absolute: 91 {cells}/uL (ref 15–500)
Eosinophils Relative: 1.3 %
HCT: 37.3 % (ref 35.0–45.0)
Hemoglobin: 11.9 g/dL (ref 11.7–15.5)
MCH: 29.2 pg (ref 27.0–33.0)
MCHC: 31.9 g/dL — ABNORMAL LOW (ref 32.0–36.0)
MCV: 91.6 fL (ref 80.0–100.0)
MPV: 9.7 fL (ref 7.5–12.5)
Monocytes Relative: 9.6 %
Neutro Abs: 4837 {cells}/uL (ref 1500–7800)
Neutrophils Relative %: 69.1 %
Platelets: 314 10*3/uL (ref 140–400)
RBC: 4.07 10*6/uL (ref 3.80–5.10)
RDW: 13 % (ref 11.0–15.0)
Total Lymphocyte: 19.4 %
WBC: 7 10*3/uL (ref 3.8–10.8)

## 2023-07-13 LAB — COMPLETE METABOLIC PANEL WITH GFR
AG Ratio: 1.5 (calc) (ref 1.0–2.5)
ALT: 26 U/L (ref 6–29)
AST: 64 U/L — ABNORMAL HIGH (ref 10–35)
Albumin: 3.4 g/dL — ABNORMAL LOW (ref 3.6–5.1)
Alkaline phosphatase (APISO): 56 U/L (ref 37–153)
BUN: 21 mg/dL (ref 7–25)
CO2: 28 mmol/L (ref 20–32)
Calcium: 9 mg/dL (ref 8.6–10.4)
Chloride: 107 mmol/L (ref 98–110)
Creat: 0.88 mg/dL (ref 0.60–0.95)
Globulin: 2.3 g/dL (ref 1.9–3.7)
Glucose, Bld: 141 mg/dL — ABNORMAL HIGH (ref 65–139)
Potassium: 4.1 mmol/L (ref 3.5–5.3)
Sodium: 142 mmol/L (ref 135–146)
Total Bilirubin: 0.4 mg/dL (ref 0.2–1.2)
Total Protein: 5.7 g/dL — ABNORMAL LOW (ref 6.1–8.1)
eGFR: 65 mL/min/{1.73_m2} (ref >=60)

## 2023-12-06 ENCOUNTER — Ambulatory Visit: Payer: Self-pay

## 2023-12-06 NOTE — Telephone Encounter (Signed)
 Copied from CRM [id number]. Topic: Clinical - Red Word Triage >> Dec 06, 2023  1:10 PM Brittney F wrote: [facility] that prompted transfer to Nurse Triage:   Cognitive decline; incontinence; patient states she is in pain and has fallen a few times  Mr. Roderic [place] her son is on the line   EL2 was recommended by the patient's Acupuncturist Complaint: Falls Symptoms: Cognitive Decline, Physical Decline  Frequency: Acute Pertinent Negatives: Patient denies injury, bruisie,  Disposition: [] ED /[] Urgent Care (no appt availability in office) / [x] Appointment(In office/virtual)/ []  [place] [facility]/ [] Home Care/ [x] Refused Recommended Disposition /[] [place] [facility]/ []  Follow-up with PCP Additional Notes: Triage call received on 12-06-2023 Patient/caregiver reports concerns of Behavioral concern and Other (Falls)  The patient lives with her son, and her son states his kids come over and assist with her care when they are able to do so. The patient has fallen multiple times within the past couple of months, The son states he did not witness the fall and stated "she's fine, that's not why I'm calling. I'm starting to learn to not mention she has fallen."  Assessment conducted based on subjective report and clinical judgment within nursing scope.   Spoke with Son for triage, reports a decline in mental status and needing an EL2 per the Child psychotherapist, for Medicare/Medicaid.  Patient advised based on reported symptoms, risk factors, and urgency of presentation. Recommended the patient see a provider soon, offered available appointments. Son chose to keep July 9th with Ralston Burkes.   Contacted [facility], Spoke with Micheal to discuss encounter and patient's history. Encounter to be reviewed.  Reason for Disposition  Suspicious history for the fall  Answer Assessment - Initial Assessment Questions 1. MECHANISM: "How did the fall happen?"     Slipped out of the bed  2. DOMESTIC VIOLENCE AND  ELDER ABUSE SCREENING: "Did you fall because someone pushed you or tried to hurt you?" If Yes, ask: "Are you safe now?"     Unsure  3. ONSET: "When did the fall happen?" (e.g., minutes, hours, or days ago)     A couple of times in the past couple of months, most recent a week ago  4. LOCATION: "What part of the body hit the ground?" (e.g., back, buttocks, head, hips, knees, hands, head, stomach)     The son is unsure, he did not witness the fall  5. INJURY: "Did you hurt (injure) yourself when you fell?" If Yes, ask: "What did you injure? Tell me more about this?" (e.g., body area; type of injury; pain severity)"     Denies injury  6. PAIN: "Is there any pain?" If Yes, ask: "How bad is the pain?" (e.g., Scale 1-10; or mild,  moderate, severe)   - NONE (0): No pain   - MILD (1-3): Doesn't interfere with normal activities    - MODERATE (4-7): Interferes with normal activities or awakens from sleep    - SEVERE (8-10): Excruciating pain, unable to do any normal activities      Denies pain  7. SIZE: For cuts, bruises, or swelling, ask: "How large is it?" (e.g., inches or centimeters)      Denies swelling   8. PREGNANCY: "Is there any chance you are pregnant?" "When was your last menstrual period?"     No and No  9. OTHER SYMPTOMS: "Do you have any other symptoms?" (e.g., dizziness, fever, weakness; new onset or worsening).      Incontinence (Bowel,  bladder)  10. CAUSE: "What do you think caused the fall (or falling)?" (e.g., tripped, dizzy spell)       No exact designated  Protocols used: Falls and [facility]

## 2024-01-12 ENCOUNTER — Ambulatory Visit: Admitting: Nurse Practitioner

## 2024-01-24 ENCOUNTER — Encounter: Payer: Self-pay | Admitting: Nurse Practitioner

## 2024-01-24 ENCOUNTER — Ambulatory Visit (INDEPENDENT_AMBULATORY_CARE_PROVIDER_SITE_OTHER): Payer: PPO | Admitting: Nurse Practitioner

## 2024-01-24 VITALS — BP 108/64 | HR 52 | Temp 97.3°F | Ht 59.0 in | Wt 99.8 lb

## 2024-01-24 DIAGNOSIS — R634 Abnormal weight loss: Secondary | ICD-10-CM

## 2024-01-24 DIAGNOSIS — F01B4 Vascular dementia, moderate, with anxiety: Secondary | ICD-10-CM | POA: Diagnosis not present

## 2024-01-24 DIAGNOSIS — M1991 Primary osteoarthritis, unspecified site: Secondary | ICD-10-CM | POA: Diagnosis not present

## 2024-01-24 DIAGNOSIS — K056 Periodontal disease, unspecified: Secondary | ICD-10-CM

## 2024-01-24 DIAGNOSIS — D509 Iron deficiency anemia, unspecified: Secondary | ICD-10-CM

## 2024-01-24 DIAGNOSIS — F5101 Primary insomnia: Secondary | ICD-10-CM

## 2024-01-24 DIAGNOSIS — F419 Anxiety disorder, unspecified: Secondary | ICD-10-CM

## 2024-01-24 NOTE — Patient Instructions (Addendum)
 Decrease abilify to 1 mg daily for 3 days then dc - this was for behaviors.   Decrease prazocin to 1 mg daily for 3 days then dc- this was for nightmares  Decrease buspar  to once daily in the evening for 3 days then half tablet in the evening for 3 days then dc- this is for anxiety  Decrease paxil  decrease 20 mg daily- this is for anxiety/depression    Will need to gradually reduce temazepam  so let me know via mychart how she does with above medication

## 2024-01-24 NOTE — Progress Notes (Signed)
 "   Careteam: Patient Care Team: [name], NP as PCP - General (Geriatric Medicine) [name], NP (Geriatric Medicine)  PLACE OF SERVICE:  [facility]  Advanced Directive information    No Known Allergies  Chief Complaint  Patient presents with   Medical Management of Chronic Issues    6 month follow up. EL2 inquiry.     HPI:  Discussed the use of AI scribe software for clinical note transcription with the patient, who gave verbal consent to proceed.  History of Present Illness Courtney Avila is an 83 year old female with dementia who presents for a six-month follow-up. She is accompanied by her daughter, who is her primary caregiver.  She has dementia with increased care needs, requiring assistance with bathing, dressing, and feeding. She is mostly in a wheelchair or on the couch and is incontinent of bowel and bladder, necessitating changes at least twice a day. Her daughter, who is a engineer, civil (consulting), assists with her care.  She has experienced significant weight loss, losing 16 pounds in the last 6 months. Her daughter is attempting to increase her caloric intake with high-calorie foods such as coconut oil, eggs, and oatmeal, as well as weight gain shakes like Ensure. She is also receiving vitamin D, fish oil, and supplements.  Her current medications include Abilify , Buspar , Paxil , and Prazosin . Abilify  was initially prescribed for behavioral issues related to dementia. Buspar  is administered twice daily, sometimes both doses at night, depending on her nervousness. Paxil  is currently at 40 mg, and Prazosin  is taken as 2 mg at bedtime for nightmares, which are no longer occurring.  She has iron deficiency anemia, which is being monitored with blood work. She is not experiencing any pain but does have stiffness from arthritis, for which Tylenol  is occasionally used.  There are concerns about her dental health due to a few remaining teeth. However unsure if she would  tolerate going to the dentist  Review of Systems:  Review of Systems  Unable to perform ROS: Dementia    Past Medical History:  Diagnosis Date   Allergy    Anxiety    Arthritis    Dementia (HCC)    GERD (gastroesophageal reflux disease)    Past Surgical History:  Procedure Laterality Date   ABDOMINAL HYSTERECTOMY     ANKLE SURGERY     growth lipoma removed   COLONOSCOPY     MULTIPLE TOOTH EXTRACTIONS     REVERSE SHOULDER ARTHROPLASTY Right 04/09/2016   REVERSE SHOULDER ARTHROPLASTY Right 04/09/2016   Procedure: RIGHT REVERSE SHOULDER ARTHROPLASTY;  Surgeon: Franky Pointer, MD;  Location: [facility] OR;  Service: Orthopedics;  Laterality: Right;   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 She has never used smokeless tobacco. She reports that she does not drink alcohol and does not use drugs.  Family History  Problem Relation Age of Onset   Diabetes Mother    Hypertension Mother    Heart disease Father    Colon cancer Father    Breast cancer Sister     Medications: Patient's Medications  New Prescriptions   No medications on file  Previous Medications   ARIPIPRAZOLE  (ABILIFY ) 2 MG TABLET    Take 2 mg by mouth at bedtime.   ARIPIPRAZOLE  (ABILIFY ) 5 MG TABLET    Take 5  mg by mouth daily.   BUSPIRONE  (BUSPAR ) 10 MG TABLET    Take 1 tablet (10 mg total) by mouth 2 (two) times daily.   CYANOCOBALAMIN (VITAMIN B12) 1000 MCG TABLET    Take 1 tablet (1,000 mcg total) by mouth daily.   ENSURE (ENSURE)    Take 237 mLs by mouth daily.   MULTIPLE VITAMINS-MINERALS (HAIR SKIN AND NAILS FORMULA PO)    Take 1 tablet by mouth daily.   PAROXETINE  (PAXIL ) 40 MG TABLET    Take 40 mg by mouth at bedtime.   PRAZOSIN  (MINIPRESS ) 1 MG CAPSULE    Take 2 mg by mouth at bedtime.   TEMAZEPAM  (RESTORIL ) 30 MG CAPSULE     Take 30 mg by mouth at bedtime.   TYLENOL  500 MG TABLET    Take 500 mg by mouth every 6 (six) hours as needed for mild pain or headache.  Modified Medications   No medications on file  Discontinued Medications   No medications on file    Physical Exam:  Vitals:   01/24/24 1505  BP: 108/64  Pulse: (!) 52  Temp: (!) 97.3 F (36.3 C)  SpO2: 98%  Weight: 99 lb 12.8 oz (45.3 kg)  Height: 4' 11 (1.499 m)   Body mass index is 20.16 kg/m. Wt Readings from Last 3 Encounters:  01/24/24 99 lb 12.8 oz (45.3 kg)  07/12/23 115 lb 12.8 oz (52.5 kg)  04/05/23 116 lb 3.2 oz (52.7 kg)    Physical Exam Constitutional:      General: She is not in acute distress.    Appearance: She is well-developed. She is not diaphoretic.     Comments: Thin female  HENT:     Head: Normocephalic and atraumatic.     Mouth/Throat:     Pharynx: No oropharyngeal exudate.  Eyes:     Conjunctiva/sclera: Conjunctivae normal.     Pupils: Pupils are equal, round, and reactive to light.  Cardiovascular:     Rate and Rhythm: Normal rate and regular rhythm.     Heart sounds: Normal heart sounds.  Pulmonary:     Effort: Pulmonary effort is normal.     Breath sounds: Normal breath sounds.  Abdominal:     General: Bowel sounds are normal.     Palpations: Abdomen is soft.  Musculoskeletal:     Cervical back: Normal range of motion and neck supple.     Right lower leg: No edema.     Left lower leg: No edema.  Skin:    General: Skin is warm and dry.  Neurological:     Mental Status: She is alert. She is disoriented.     Motor: Weakness present.     Gait: Gait abnormal.  Psychiatric:        Mood and Affect: Mood normal.     Labs reviewed: Basic Metabolic Panel: Recent Labs    07/12/23 1605  NA 142  K 4.1  CL 107  CO2 28  GLUCOSE 141*  BUN 21  CREATININE 0.88  CALCIUM 9.0   Liver Function Tests: Recent Labs    07/12/23 1605  AST 64*  ALT 26  BILITOT 0.4  PROT 5.7*   No results for  input(s): LIPASE, AMYLASE in the last 8760 hours. No results for input(s): AMMONIA in the last 8760 hours. CBC: Recent Labs    07/12/23 1605  WBC 7.0  NEUTROABS 4,837  HGB 11.9  HCT 37.3  MCV 91.6  PLT 314   Lipid Panel:  No results for input(s): CHOL, HDL, LDLCALC, TRIG, CHOLHDL, LDLDIRECT in the last 8760 hours. TSH: No results for input(s): TSH in the last 8760 hours. A1C: Lab Results  Component Value Date   HGBA1C 5.9 (H) 03/24/2015     Assessment/Plan Assessment and Plan Assessment & Plan Dementia Advanced dementia with significant weight loss and need for assistance with daily activities. Discussed progression and potential need for skilled nursing care, palliative care, or hospice. - Consider skilled nursing facility placement. - Initiate social work referral for accepting facilities. - Discuss palliative care and hospice options with family. - Encourage high-calorie diet and consider weight gain supplements like Ensure. discussed reducing or discontinuing to minimize side effects and interactions. Emphasized gradual tapering to monitor adverse effects. - Decrease Abilify  to 1 mg daily for 3 days, then discontinue, monitor for worsening behaviors - Decrease Prazosin  to 1 mg daily for 3 days, then discontinue- notify for worsening nightmares  Anxiety Overall well controlled at this time - Decrease Buspar  to once daily in the evening for 3 days, then half tablet in the evening for 3 days, then discontinue. - Decrease Paxil  to 20 mg daily. - Consider reducing Restoril  dose after other medications are adjusted- will need new Rx - Monitor for changes in behavior or symptoms during medication adjustments.  Iron Deficiency Anemia Iron deficiency anemia noted. Plan to recheck blood work. - Order blood work to recheck iron levels.  Periodontal Disease Periodontal disease with poor dental condition. Discussed challenges of dental care in elderly with  dementia and advised against sedation. - Encourage regular tooth brushing to prevent worsening. - Consult with a dentist regarding potential treatment options.  Weight loss encouraged to liberalize diet. To have protein supplement in addition to smallest meal of the day.  Goals of Care Opted for natural course of care with no resuscitation or hospital visits unless necessary for comfort. - Document decision for natural course of care with no resuscitation or hospital visits unless for comfort. DNR  Follow-up Plan to monitor condition and medication adjustments through virtual visits and MyChart messages. - Schedule virtual follow-up visit in 3 months. - MyChart message in 1 month to update on medication adjustments and condition. - Order blood work today.  Return in about 3 months (around 04/25/2024) for mood-virtual .  Courtney Avila [facility] [phone]  "

## 2024-01-25 ENCOUNTER — Ambulatory Visit: Payer: Self-pay | Admitting: Nurse Practitioner

## 2024-01-25 ENCOUNTER — Telehealth: Payer: Self-pay

## 2024-01-25 LAB — COMPREHENSIVE METABOLIC PANEL WITH GFR
AG Ratio: 1.6 (calc) (ref 1.0–2.5)
ALT: 9 U/L (ref 6–29)
AST: 16 U/L (ref 10–35)
Albumin: 3.4 g/dL — ABNORMAL LOW (ref 3.6–5.1)
Alkaline phosphatase (APISO): 47 U/L (ref 37–153)
BUN: 22 mg/dL (ref 7–25)
CO2: 29 mmol/L (ref 20–32)
Calcium: 8.9 mg/dL (ref 8.6–10.4)
Chloride: 106 mmol/L (ref 98–110)
Creat: 0.74 mg/dL (ref 0.60–0.95)
Globulin: 2.1 g/dL (ref 1.9–3.7)
Glucose, Bld: 110 mg/dL — ABNORMAL HIGH (ref 65–99)
Potassium: 4 mmol/L (ref 3.5–5.3)
Sodium: 142 mmol/L (ref 135–146)
Total Bilirubin: 0.3 mg/dL (ref 0.2–1.2)
Total Protein: 5.5 g/dL — ABNORMAL LOW (ref 6.1–8.1)
eGFR: 80 mL/min/1.73m2 (ref >=60)

## 2024-01-25 LAB — IRON,TIBC AND FERRITIN PANEL
%SAT: 25 % (ref 16–45)
Ferritin: 102 ng/mL (ref 16–288)
Iron: 63 ug/dL (ref 45–160)
TIBC: 248 ug/dL — ABNORMAL LOW (ref 250–450)

## 2024-01-25 LAB — CBC WITH DIFFERENTIAL/PLATELET
Absolute Lymphocytes: 1387 {cells}/uL (ref 850–3900)
Absolute Monocytes: 444 {cells}/uL (ref 200–950)
Basophils Absolute: 31 {cells}/uL (ref 0–200)
Basophils Relative: 0.6 %
Eosinophils Absolute: 102 {cells}/uL (ref 15–500)
Eosinophils Relative: 2 %
HCT: 33.9 % — ABNORMAL LOW (ref 35.0–45.0)
Hemoglobin: 10.8 g/dL — ABNORMAL LOW (ref 11.7–15.5)
MCH: 30.6 pg (ref 27.0–33.0)
MCHC: 31.9 g/dL — ABNORMAL LOW (ref 32.0–36.0)
MCV: 96 fL (ref 80.0–100.0)
MPV: 9.2 fL (ref 7.5–12.5)
Monocytes Relative: 8.7 %
Neutro Abs: 3137 {cells}/uL (ref 1500–7800)
Neutrophils Relative %: 61.5 %
Platelets: 285 Thousand/uL (ref 140–400)
RBC: 3.53 Million/uL — ABNORMAL LOW (ref 3.80–5.10)
RDW: 14.6 % (ref 11.0–15.0)
Total Lymphocyte: 27.2 %
WBC: 5.1 Thousand/uL (ref 3.8–10.8)

## 2024-01-25 LAB — TEST AUTHORIZATION

## 2024-01-25 NOTE — Progress Notes (Unsigned)
 Complex Care Management Note Care Guide Note  01/25/2024 Name: Courtney Avila MRN: [id number] DOB: 1941-03-20   Complex Care Management Outreach Attempts: An unsuccessful telephone outreach was attempted today to offer the patient information about available complex care management services.  Follow Up Plan:  Additional outreach attempts will be made to offer the patient complex care management information and services.   Encounter Outcome:  No Answer  Leotis Rase [facility], [facility] Guide  Direct Dial: [phone]  Fax [phone]

## 2024-01-26 NOTE — Progress Notes (Unsigned)
 Complex Care Management Note Care Guide Note  01/26/2024 Name: Courtney Avila MRN: [id number] DOB: [birth year]   Complex Care Management Outreach Attempts: A second unsuccessful outreach was attempted today to offer the patient with information about available complex care management services.  Follow Up Plan:  Additional outreach attempts will be made to offer the patient complex care management information and services.   Encounter Outcome:  No Answer  .br

## 2024-01-27 NOTE — Progress Notes (Signed)
 Complex Care Management Note Care Guide Note  01/27/2024 Name: Courtney Avila MRN: [id number] DOB: 1941/03/24   Complex Care Management Outreach Attempts: A third unsuccessful outreach was attempted today to offer the patient with information about available complex care management services.  Follow Up Plan:  No further outreach attempts will be made at this time. We have been unable to contact the patient to offer or enroll patient in complex care management services.  Encounter Outcome:  No Answer  Leotis Rase [facility], [facility] Guide  Direct Dial: [phone]  Fax [phone]

## 2024-02-10 ENCOUNTER — Encounter: Payer: Self-pay | Admitting: Nurse Practitioner

## 2024-02-10 ENCOUNTER — Ambulatory Visit: Payer: PPO | Admitting: Nurse Practitioner

## 2024-02-10 ENCOUNTER — Telehealth: Payer: Self-pay | Admitting: *Deleted

## 2024-02-10 DIAGNOSIS — M1991 Primary osteoarthritis, unspecified site: Secondary | ICD-10-CM | POA: Diagnosis not present

## 2024-02-10 DIAGNOSIS — F01B4 Vascular dementia, moderate, with anxiety: Secondary | ICD-10-CM | POA: Diagnosis not present

## 2024-02-10 DIAGNOSIS — Z0000 Encounter for general adult medical examination without abnormal findings: Secondary | ICD-10-CM | POA: Diagnosis not present

## 2024-02-10 NOTE — Progress Notes (Signed)
  This service is provided via telemedicine  No vital signs collected/recorded due to the encounter was a telemedicine visit.   Location of patient (ex: home, work):  Home  Patient consents to a telephone visit:  Yes  Location of the provider (ex: office, home):  Office [place].   Name of any referring provider:  na  Names of all persons participating in the telemedicine service and their role in the encounter:  Montel Seip, Patient, Donzell Beal, CMA, Harlene An, NP  Time spent on call:  7:10

## 2024-02-10 NOTE — Telephone Encounter (Signed)
 Ms. Courtney Avila, Courtney Avila are scheduled for a virtual visit with your provider today.    Just as we do with appointments in the office, we must obtain your consent to participate.  Your consent will be active for this visit and any virtual visit you Courtney Avila have with one of our providers in the next 365 days.    If you have a MyChart account, I can also send a copy of this consent to you electronically.  All virtual visits are billed to your insurance company just like a traditional visit in the office.  As this is a virtual visit, video technology does not allow for your provider to perform a traditional examination.  This Courtney Avila limit your provider's ability to fully assess your condition.  If your provider identifies any concerns that need to be evaluated in person or the need to arrange testing such as labs, EKG, etc, we will make arrangements to do so.    Although advances in technology are sophisticated, we cannot ensure that it will always work on either your end or our end.  If the connection with a video visit is poor, we Courtney Avila have to switch to a telephone visit.  With either a video or telephone visit, we are not always able to ensure that we have a secure connection.   I need to obtain your verbal consent now.   Are you willing to proceed with your visit today?   Courtney Avila has provided verbal consent on 02/10/2024 for a virtual visit (video or telephone).   MayDonzell LABOR, CMA 02/10/2024  10:55 AM

## 2024-02-10 NOTE — Patient Instructions (Signed)
  Ms. Courtney Avila , Thank you for taking time to come for your Medicare Wellness Visit. I appreciate your ongoing commitment to your health goals. Please review the following plan we discussed and let me know if I can assist you in the future. This is a list of the screening recommended for you and due dates:    FLU Vaccine due to in September/October     Health Maintenance  Topic Date Due   Flu Shot  02/04/2024   Zoster (Shingles) Vaccine (1 of 2) 07/06/2028*   Medicare Annual Wellness Visit  02/09/2025   DTaP/Tdap/Td vaccine (2 - Td or Tdap) 10/02/2032   Pneumococcal Vaccine for age over 65  Completed   Hepatitis B Vaccine  Aged Out   HPV Vaccine  Aged Out   Meningitis B Vaccine  Aged Out   DEXA scan (bone density measurement)  Discontinued   COVID-19 Vaccine  Discontinued  *Topic was postponed. The date shown is not the original due date.

## 2024-02-10 NOTE — Progress Notes (Signed)
 Subjective:   Courtney Avila is a 83 y.o. female who presents for Medicare Annual (Subsequent) preventive examination.  Visit Complete: Virtual I connected with  [name] on 02/10/24 by a video and audio enabled telemedicine application and verified that I am speaking with the correct person using two identifiers.  Patient Location: Home  Provider Location: Office/Clinic  I discussed the limitations of evaluation and management by telemedicine. The patient expressed understanding and agreed to proceed.  Vital Signs: Because this visit was a virtual/telehealth visit, some criteria may be missing or patient reported. Any vitals not documented were not able to be obtained and vitals that have been documented are patient reported.         Objective:    There were no vitals filed for this visit. There is no height or weight on file to calculate BMI.     02/10/2024   10:51 AM 07/12/2023    3:37 PM 04/05/2023    3:31 PM 02/04/2023   10:55 AM 10/02/2022    9:36 PM 09/21/2022    2:37 PM 08/04/2022    8:40 PM  Advanced Directives  Does Patient Have a Medical Advance Directive? Yes No Yes Yes No Yes No  Type of Estate agent of [place];Out of facility DNR (pink MOST or yellow form)  Healthcare Power of [employer] Power of [employer] Power of Attorney   Does patient want to make changes to medical advance directive? No - Patient declined  No - Patient declined No - Patient declined  No - Patient declined   Copy of Healthcare Power of Attorney in Chart? No - copy requested  No - copy requested No - copy requested  No - copy requested   Would patient like information on creating a medical advance directive?  No - Patient declined   No - Patient declined  No - Patient declined    Current Medications (verified) Outpatient Encounter Medications as of 02/10/2024  Medication Sig   ARIPiprazole (ABILIFY) 2 MG tablet Take 2 mg by mouth at bedtime.    busPIRone  (BUSPAR ) 10 MG tablet Take 1 tablet (10 mg total) by mouth 2 (two) times daily.   Ensure (ENSURE) Take 237 mLs by mouth daily.   Multiple Vitamins-Minerals (HAIR SKIN AND NAILS FORMULA PO) Take 1 tablet by mouth daily.   PARoxetine  (PAXIL ) 40 MG tablet Take 40 mg by mouth at bedtime.   prazosin  (MINIPRESS ) 1 MG capsule Take 2 mg by mouth at bedtime.   temazepam  (RESTORIL ) 30 MG capsule Take 30 mg by mouth at bedtime.   TYLENOL  500 MG tablet Take 500 mg by mouth every 6 (six) hours as needed for mild pain or headache.   No facility-administered encounter medications on file as of 02/10/2024.    Allergies (verified) Patient has no known allergies.   History: Past Medical History:  Diagnosis Date   Allergy    Anxiety    Arthritis    Dementia (HCC)    GERD (gastroesophageal reflux disease)    Past Surgical History:  Procedure Laterality Date   ABDOMINAL HYSTERECTOMY     ANKLE SURGERY     growth lipoma removed   COLONOSCOPY     MULTIPLE TOOTH EXTRACTIONS     REVERSE SHOULDER ARTHROPLASTY Right 04/09/2016   REVERSE SHOULDER ARTHROPLASTY Right 04/09/2016   Procedure: RIGHT REVERSE SHOULDER ARTHROPLASTY;  Surgeon: Franky Pointer, MD;  Location: [facility];  Service: Orthopedics;  Laterality: Right;   TOTAL KNEE ARTHROPLASTY  Right 12/07/2016   Procedure: RIGHT TOTAL KNEE ARTHROPLASTY;  Surgeon: Melodi Lerner, MD;  Location: [facility];  Service: Orthopedics;  Laterality: Right;   Family History  Problem Relation Age of Onset   Diabetes Mother    Hypertension Mother    Heart disease Father    Colon cancer Father    Breast cancer Sister    Social History   Socioeconomic History   Marital status: Divorced    Spouse name: Not on file   Number of children: Not on file   Years of education: Not on file   Highest education level: Not on file  Occupational History   Not on file  Tobacco Use   Smoking status: Never   Smokeless tobacco: Never  Vaping Use   Vaping status: Never Used   Substance and Sexual Activity   Alcohol use: Never   Drug use: Never   Sexual activity: Not Currently  Other Topics Concern   Not on file  Social History Narrative   ** Merged History Encounter **       Right handed Lives with her son [name] Two story home Drinks no caffeine retired   Chief Executive Officer [employer] of Corporate investment banker Strain: Not on file  Food Insecurity: No Food Insecurity (02/10/2024)   Hunger Vital Sign    Worried About Running Out of Food in the Last Year: Never true    Ran Out of Food in the Last Year: Never true  Transportation Needs: No Transportation Needs (02/10/2024)   PRAPARE - Administrator, Civil Service (Medical): No    Lack of Transportation (Non-Medical): No  Physical Activity: Not on file  Stress: Not on file  Social Connections: Not on file    Tobacco Counseling Counseling given: Not Answered   Clinical Intake:                        Activities of Daily Living     No data to display          Patient Care Team: [name], NP as PCP - General (Geriatric Medicine) [name], NP (Geriatric Medicine)  Indicate any recent Medical Services you may have received from other than [facility] providers in the past year (date may be approximate).     Assessment:   This is a routine wellness examination for Courtney Avila.  Hearing/Vision screen Vision Screening - Comments:: No Eye Dr.    Mattie Addressed   None    Depression Screen    02/10/2024   10:51 AM 07/12/2023    3:35 PM 04/05/2023    3:31 PM 02/04/2023   10:55 AM 12/21/2022    2:05 PM 11/02/2016    3:35 PM 03/24/2015    3:48 PM  PHQ 2/9 Scores  PHQ - 2 Score 0 1 0 0 0 0 4  PHQ- 9 Score   0    13  Exception Documentation     Other- indicate reason in comment box    Not completed     Established with [facility]      Fall Risk    02/10/2024   10:50 AM 07/12/2023    3:35 PM 04/05/2023    3:32 PM 02/04/2023   10:55 AM 12/21/2022    2:05 PM  Fall Risk    Falls in the past year? 1 1 0 1 1  Number falls in past yr: 1 1 0 1 0  Injury with Fall?  1 0 0 0 0  Risk for fall due to : Impaired balance/gait;Impaired mobility  History of fall(s)  No Fall Risks  Follow up Falls evaluation completed  Falls evaluation completed  Falls evaluation completed    MEDICARE RISK AT HOME:    TIMED UP AND GO:  Was the test performed?  No    Cognitive Function:    05/27/2022   10:00 AM 05/04/2022   11:01 AM  MMSE - Mini Mental State Exam  Orientation to time 0 1  Orientation to Place 0 2  Registration 0 3  Attention/ Calculation 0 1  Recall 0 0  Language- name 2 objects 2 2  Language- repeat 1 1  Language- follow 3 step command 0 2  Language- read & follow direction 1 1  Write a sentence 0 0  Copy design 0 0  Total score 4 13        02/10/2024   10:52 AM 02/04/2023   10:58 AM  6CIT Screen  What Year? 4 points 4 points  What month? 3 points 3 points  What time? 0 points 3 points  Count back from 20 4 points 2 points  Months in reverse 4 points 4 points  Repeat phrase 10 points 10 points  Total Score 25 points 26 points    Immunizations Immunization History  Administered Date(s) Administered   Fluad Trivalent(High Dose 65+) 04/05/2023   PNEUMOCOCCAL CONJUGATE-20 05/04/2022   Tdap 10/03/2022    TDAP status: Up to date  Flu Vaccine status: due  Pneumococcal vaccine status: Up to date  Covid-19 vaccine status: Information provided on how to obtain vaccines.   Qualifies for Shingles Vaccine? Yes   Zostavax completed No   Shingrix Completed?: No.    Education has been provided regarding the importance of this vaccine. Patient has been advised to call insurance company to determine out of pocket expense if they have not yet received this vaccine. Advised may also receive vaccine at local pharmacy or Health Dept. Verbalized acceptance and understanding.  Screening Tests Health Maintenance  Topic Date Due   DEXA SCAN  Never done    Medicare Annual Wellness (AWV)  02/04/2024   INFLUENZA VACCINE  02/04/2024   Zoster Vaccines- Shingrix (1 of 2) 07/06/2028 (Originally 03/04/1991)   DTaP/Tdap/Td (2 - Td or Tdap) 10/02/2032   Pneumococcal Vaccine: 50+ Years  Completed   Hepatitis B Vaccines  Aged Out   HPV VACCINES  Aged Out   Meningococcal B Vaccine  Aged Out   COVID-19 Vaccine  Discontinued    Health Maintenance  Health Maintenance Due  Topic Date Due   DEXA SCAN  Never done   Medicare Annual Wellness (AWV)  02/04/2024   INFLUENZA VACCINE  02/04/2024    Colorectal cancer screening: No longer required.   Mammogram status: No longer required due to age.  Lung Cancer Screening: (Low Dose CT Chest recommended if Age 44-80 years, 20 pack-year currently smoking OR have quit w/in 15years.) does not qualify.   Lung Cancer Screening Referral:  na  Additional Screening:  Hepatitis C Screening: does not qualify  Vision Screening: Recommended annual ophthalmology exams for early detection of glaucoma and other disorders of the eye. Is the patient up to date with their annual eye exam?  No  Who is the provider or what is the name of the office in which the patient attends annual eye exams? Does not have eye doctor, does not wish to go   Dental Screening: Recommended annual  dental exams for proper oral hygiene   Community Resource Referral / Chronic Care Management: CRR required this visit?  No   CCM required this visit?  No     Plan:     I have personally reviewed and noted the following in the patient's chart:   Medical and social history Use of alcohol, tobacco or illicit drugs  Current medications and supplements including opioid prescriptions. Patient is not currently taking opioid prescriptions. Functional ability and status Nutritional status Physical activity Advanced directives List of other physicians Hospitalizations, surgeries, and ER visits in previous 12 months Vitals Screenings to  include cognitive, depression, and falls Referrals and appointments  In addition, I have reviewed and discussed with patient certain preventive protocols, quality metrics, and best practice recommendations. A written personalized care plan for preventive services as well as general preventive health recommendations were provided to patient.     Harlene MARLA An, NP   02/10/2024   After Visit Summary: (MyChart) Due to this being a telephonic visit, the after visit summary with patients personalized plan was offered to patient via MyChart

## 2024-02-10 NOTE — Progress Notes (Addendum)
 Patient DME for wheelchair printed and placed into outgoing mail box.

## 2024-03-08 ENCOUNTER — Telehealth: Payer: Self-pay

## 2024-03-08 NOTE — Telephone Encounter (Signed)
 Spoke with patient's son this afternoon in regards that he has received a DME order for a wheelchair in the mail and wanted to ask [name], NP does the order needs to be sign because patient's son was concerned.  Message sent to [name], NP

## 2024-03-08 NOTE — Telephone Encounter (Signed)
 Patient son [name] on the phone parked at 38, I have notified [place].B/CMA and she is assisting son on call.

## 2024-03-08 NOTE — Telephone Encounter (Signed)
 Left a detail message for patient's son to callback about concerns for the wheel chair and need more information of his concern.

## 2024-03-08 NOTE — Telephone Encounter (Signed)
 Copied from CRM #[id number]. Topic: General - Other >> Mar 07, 2024  4:06 PM Mercer PEDLAR wrote: Reason for CRM: [name] Cline (son) called stating that he receieved the DME order for wheelchair in the mail but is unsure if he needs to do something with it. He would like a callback to let him know how to proceed.    CB: [phone]

## 2024-03-09 NOTE — Telephone Encounter (Signed)
 Should be okay, let us  know if we need to fax over signed Rx

## 2024-03-09 NOTE — Telephone Encounter (Signed)
 Called patients son to seek clarity about message.  1.) I asked who did the order come from, response: Us .   2.) I asked when did he receive the order, response: 2-3 weeks ago.  3.) I asked did he request the order, response- yes  4.) [name] then asked, what is he suppose to do with order and emphasized it has an area for the provider to sign and that was left blank by our office.  5.) I informed [name] that we [employer]) will fax to [facility], F [phone] and they will be in contact with them or us  (if there are any issues). [name] is aware that if [facility] is unable to process we can take an alternate route of submitting through a DME company ([employer] website)  This message will be further addressed, if needed by the clinical intake assistant!

## 2024-04-06 NOTE — Telephone Encounter (Signed)
 Copied from CRM #[id number]. Topic: General - Other >> Mar 07, 2024  4:06 PM Mercer PEDLAR wrote: Reason for CRM: [name] Cline (son) called stating that he receieved the DME order for wheelchair in the mail but is unsure if he needs to do something with it. He would like a callback to let him know how to proceed.    CB: [phone] >> Apr 05, 2024  4:51 PM Graeme ORN wrote: Patient grandson called. States order for wheel chair needs to be sent Not showing on DPR. Just wanted to leave message - stated may be under [name] but that is the son. [facility]: Fax [phone]. [facility] Fax: [phone]. Piece of paper that says Rx needs to be sent there.

## 2024-04-06 NOTE — Telephone Encounter (Signed)
 Left message on voicemail for patient/son to return call when available. The message that we received from the [employer] agent is convoluted (2 different agency's are listed) and it appears this wheelchair request is ongoing x 1 month or longer. Information to be shared/answered when call returned:   1.) I need to know which company are we sending the order to?  2.) What happened with [facility] (where order was previously sent)?  3.) Most company's require a recent office visit to support order request and patient was last seen 2 months ago for an annual wellness visit. We may need to schedule a video or in person visit, however we can wait to see that the company says once we send order

## 2024-04-07 NOTE — Telephone Encounter (Signed)
 [name] Cline (son) is returning call.

## 2024-04-11 ENCOUNTER — Telehealth: Payer: Self-pay

## 2024-04-11 DIAGNOSIS — F01B4 Vascular dementia, moderate, with anxiety: Secondary | ICD-10-CM

## 2024-04-11 NOTE — Telephone Encounter (Signed)
 Courtney Avila called to say that order for wheelchair needs to be sent to [facility], F [phone], P, [phone].  Courtney Avila was made aware that order may require an updated face-to-face visit, however I will submit with last 2 office notes and the medical equipment company will let us  know if any additional information is needed.  Courtney Avila emphasized that he is unable to bring mother in and that is why he needs the wheelchair, I sympathized with Courtney Avila, although we would be unable to over ride insurance requirements.  Order fro August sent as requested

## 2024-04-17 NOTE — Telephone Encounter (Signed)
 Order was previously faxed 04/11/2024. I have reprinted order and faxed again as requested.

## 2024-04-17 NOTE — Telephone Encounter (Unsigned)
 Copied from CRM [id number]. Topic: General - Other >> Apr 17, 2024 12:58 PM Chiquita SQUIBB wrote: Reason for CRM: Tersa from [facility] is calling in stating they need the clinical notes and what the need for the wheelchair is due to insurance, the medical necessity for the wheelchair. Please fax to [phone] and a good call back number is [phone]

## 2024-05-01 ENCOUNTER — Encounter: Payer: Self-pay | Admitting: Nurse Practitioner

## 2024-05-01 ENCOUNTER — Telehealth: Admitting: Nurse Practitioner

## 2024-05-01 DIAGNOSIS — F5101 Primary insomnia: Secondary | ICD-10-CM | POA: Diagnosis not present

## 2024-05-01 DIAGNOSIS — R634 Abnormal weight loss: Secondary | ICD-10-CM | POA: Diagnosis not present

## 2024-05-01 DIAGNOSIS — D509 Iron deficiency anemia, unspecified: Secondary | ICD-10-CM

## 2024-05-01 DIAGNOSIS — F01B4 Vascular dementia, moderate, with anxiety: Secondary | ICD-10-CM

## 2024-05-01 NOTE — Progress Notes (Signed)
 Careteam: Patient Care Team: [name], NP as PCP - General (Geriatric Medicine) [name], NP (Geriatric Medicine)  Advanced Directive information    No Known Allergies  Chief Complaint  Patient presents with   Medical Management of Chronic Issues    No questions or concerns at this time  Patient grandson stated she hasn't received her flu shot yet.    Discussed the use of AI scribe software for clinical note transcription with the patient, who gave verbal consent to proceed.  HPI: Patient is a 83 y.o. female for follow up  Discussed the use of AI scribe software for clinical note transcription with the patient, who gave verbal consent to proceed.  History of Present Illness Courtney Avila is an 83 year old female with dementia who presents for a three-month follow-up.  Over the summer, she experienced weight loss, and there was an attempt to adjust her medications. Recently, her psychiatrist advised discontinuing Abilify. She continues to take Buspar  10 mg twice daily and Paxil  40 mg daily. She is also on Minipress  (prazosin ) 1 mg at bedtime.   Her grandson reports that she has been eating better, consuming more food such as sandwiches and bread. She can feed herself but requires assistance with liquids. Her grandson often feeds her oatmeal in the morning and provides Boost or Ensure supplements.   She sleeps well at night.  She was noted to be anemic on last labs, iron supplement recommended, son is managing medication   There are no current concerns about pain, and her son did not express any additional concerns during the visit.   Review of Systems:  Review of Systems  Unable to perform ROS: Dementia    Past Medical History:  Diagnosis Date   Allergy    Anxiety    Arthritis    Dementia (HCC)    GERD (gastroesophageal reflux disease)    Past Surgical History:  Procedure Laterality Date   ABDOMINAL HYSTERECTOMY     ANKLE SURGERY      growth lipoma removed   COLONOSCOPY     MULTIPLE TOOTH EXTRACTIONS     REVERSE SHOULDER ARTHROPLASTY Right 04/09/2016   REVERSE SHOULDER ARTHROPLASTY Right 04/09/2016   Procedure: RIGHT REVERSE SHOULDER ARTHROPLASTY;  Surgeon: Franky Pointer, MD;  Location: [facility] OR;  Service: Orthopedics;  Laterality: Right;   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 She has never used smokeless tobacco. She reports that she does not drink alcohol and does not use drugs.  Family History  Problem Relation Age of Onset   Diabetes Mother    Hypertension Mother    Heart disease Father    Colon cancer Father    Breast cancer Sister     Medications: Patient's Medications  New Prescriptions   No medications on file  Previous Medications   ARIPIPRAZOLE (ABILIFY) 2 MG TABLET    Take 2 mg by mouth at bedtime.   BUSPIRONE  (BUSPAR ) 10 MG TABLET    Take 1 tablet (10 mg total) by mouth 2 (two) times daily.   ENSURE (ENSURE)    Take 237 mLs by mouth daily.   MULTIPLE VITAMINS-MINERALS (HAIR SKIN AND NAILS FORMULA PO)    Take 1 tablet by mouth daily.   PAROXETINE  (PAXIL ) 40 MG TABLET    Take 40  mg by mouth at bedtime.   PRAZOSIN  (MINIPRESS ) 1 MG CAPSULE    Take 2 mg by mouth at bedtime.   TEMAZEPAM  (RESTORIL ) 30 MG CAPSULE    Take 30 mg by mouth at bedtime.   TYLENOL  500 MG TABLET    Take 500 mg by mouth every 6 (six) hours as needed for mild pain or headache.  Modified Medications   No medications on file  Discontinued Medications   No medications on file    Physical Exam:  There were no vitals filed for this visit. There is no height or weight on file to calculate BMI. Wt Readings from Last 3 Encounters:  01/24/24 99 lb 12.8 oz (45.3 kg)  07/12/23 115 lb 12.8 oz (52.5 kg)  04/05/23 116 lb 3.2 oz (52.7 kg)    Physical  Exam Constitutional:      Appearance: Normal appearance.  Pulmonary:     Effort: Pulmonary effort is normal.  Neurological:     Mental Status: She is alert. Mental status is at baseline.  Psychiatric:        Mood and Affect: Mood normal.     Labs reviewed: Basic Metabolic Panel: Recent Labs    07/12/23 1605 01/24/24 1540  NA 142 142  K 4.1 4.0  CL 107 106  CO2 28 29  GLUCOSE 141* 110*  BUN 21 22  CREATININE 0.88 0.74  CALCIUM 9.0 8.9   Liver Function Tests: Recent Labs    07/12/23 1605 01/24/24 1540  AST 64* 16  ALT 26 9  BILITOT 0.4 0.3  PROT 5.7* 5.5*   No results for input(s): LIPASE, AMYLASE in the last 8760 hours. No results for input(s): AMMONIA in the last 8760 hours. CBC: Recent Labs    07/12/23 1605 01/24/24 1540  WBC 7.0 5.1  NEUTROABS 4,837 3,137  HGB 11.9 10.8*  HCT 37.3 33.9*  MCV 91.6 96.0  PLT 314 285   Lipid Panel: No results for input(s): CHOL, HDL, LDLCALC, TRIG, CHOLHDL, LDLDIRECT in the last 8760 hours. TSH: No results for input(s): TSH in the last 8760 hours. A1C: Lab Results  Component Value Date   HGBA1C 5.9 (H) 03/24/2015     Assessment/Plan 1. Weight loss (Primary) -encouraged to liberalize diet. To have protein supplement in addition to smallest meal of the day.  2. Moderate vascular dementia with anxiety (HCC) -progressive decline noted but has help from family with ADLs.  Anxiety and mood well controlled. Followed by a psychiatrist to help with medication adjustments .  3. Primary insomnia Well controlled at this time, continues on prazosin  1 mg at bedtime   4. Iron deficiency anemia, unspecified iron deficiency anemia type -continue on supplement, follow up cbc with next in office visit.   Next appt: 07/31/2024 for follow up and labs Courtney Avila  [facility] [phone]    Virtual Visit via video  I connected with patient on 05/01/24 at 11:40 AM  EDT by mychart and verified that I am speaking with the correct person using two identifiers.  Location: Patient: home Provider[facility]   I discussed the limitations, risks, security and privacy concerns of performing an evaluation and management service by telephone and the availability of in person appointments. I also discussed with the patient that there may be a patient responsible charge related to this service. The patient expressed understanding and agreed to proceed.   I discussed the assessment and treatment plan with the patient. The patient  was provided an opportunity to ask questions and all were answered. The patient agreed with the plan and demonstrated an understanding of the instructions.   The patient was advised to call back or seek an in-person evaluation if the symptoms worsen or if the condition fails to improve as anticipated.  I provided 25 minutes of non-face-to-face time during this encounter.  Shaniah Baltes K. Caro Avila Avs printed and mailed

## 2024-05-01 NOTE — Progress Notes (Signed)
 This service is provided via telemedicine  No vital signs collected/recorded due to the encounter was a telemedicine visit.   Location of patient (ex: home, work):  home  Patient consents to a telephone visit: yes  Location of the provider (ex: office, home):  [facility]   Name of any referring provider:  N/A  Names of all persons participating in the telemedicine service and their role in the encounter:  Evertt Pereyra CMA, Caro Raisin, NP and Patient, Patient son.  Time spent on call: 5

## 2024-06-12 ENCOUNTER — Ambulatory Visit: Payer: Self-pay

## 2024-06-12 NOTE — Telephone Encounter (Signed)
 Tried calling Patient and [place] to offer an appointment to be seen tomorrow. LMOM to return call.

## 2024-06-12 NOTE — Telephone Encounter (Signed)
 FYI Only or Action Required?: Action required by provider: request for appointment, clinical question for provider, update on patient condition, and Grandson might take patient to Urgent Care tomorrow but states he doesn't think he can take her to an appointment today.  Patient was last seen in primary care on 05/01/2024 by [name], NP.  Called Nurse Triage reporting Urinary Frequency.  Symptoms began yesterday.  Interventions attempted: OTC medications: Tylenol , Rest, hydration, or home remedies, and Other: water, cranberry juice.  Symptoms are: gradually worsening.  Triage Disposition: See HCP Within 4 Hours (Or PCP Triage)  Patient/caregiver understands and will follow disposition?: No, wishes to speak with PCP                Copied from CRM #[id number]. Topic: Clinical - Red Word Triage >> Jun 12, 2024  3:24 PM Fredrica W wrote: Red Word that prompted transfer to Nurse Triage: worsening Pain - possible uti Reason for Disposition  [1] SEVERE pain with urination (e.g., excruciating) AND [2] not improved after 2 hours of pain medicine  Answer Assessment - Initial Assessment Questions Pain since yesterday--bladder pain, dark urine, burning pain, frequent urination,  Hydrating and cranberry juice but grandson thinks they aren't helping that much Grandson states that she has dementia and she hasn't had any major changes in cognition She has been having normal bowel movements Denies vomiting, known fevers Had Tylenol  for pain  Darden is advised that with her symptoms, it is advised that the patient be seen today within the next four hours No openings in PCP office He states that he doesn't think he can get her out of bed to take her to Urgent Care at this time He states he may take her to Urgent Care tomorrow but asked about an appointment with the PCP office  Patient's grandson is advised to call us  back if anything changes or with any further  questions/concerns. He is advised that if anything worsens to call 911. He verbalized understanding.  Protocols used: Urination Pain - Female-A-AH

## 2024-06-13 NOTE — Telephone Encounter (Signed)
 Spoke with Courtney Avila. Per Brand patient did not go to Urgent Care yesterday, however they are planning to take her today.

## 2024-06-15 ENCOUNTER — Emergency Department (HOSPITAL_COMMUNITY)

## 2024-06-15 ENCOUNTER — Inpatient Hospital Stay (HOSPITAL_COMMUNITY)
Admission: EM | Admit: 2024-06-15 | Discharge: 2024-07-15 | DRG: 956 | Disposition: A | Discharge status: Hospice | Attending: Internal Medicine | Admitting: Internal Medicine

## 2024-06-15 DIAGNOSIS — G4700 Insomnia, unspecified: Secondary | ICD-10-CM | POA: Diagnosis present

## 2024-06-15 DIAGNOSIS — D62 Acute posthemorrhagic anemia: Secondary | ICD-10-CM | POA: Diagnosis not present

## 2024-06-15 DIAGNOSIS — F02C4 Dementia in other diseases classified elsewhere, severe, with anxiety: Secondary | ICD-10-CM | POA: Diagnosis present

## 2024-06-15 DIAGNOSIS — R64 Cachexia: Secondary | ICD-10-CM | POA: Diagnosis present

## 2024-06-15 DIAGNOSIS — L89611 Pressure ulcer of right heel, stage 1: Secondary | ICD-10-CM | POA: Diagnosis present

## 2024-06-15 DIAGNOSIS — S72141G Displaced intertrochanteric fracture of right femur, subsequent encounter for closed fracture with delayed healing: Secondary | ICD-10-CM

## 2024-06-15 DIAGNOSIS — R627 Adult failure to thrive: Secondary | ICD-10-CM | POA: Diagnosis present

## 2024-06-15 DIAGNOSIS — Z803 Family history of malignant neoplasm of breast: Secondary | ICD-10-CM

## 2024-06-15 DIAGNOSIS — Z833 Family history of diabetes mellitus: Secondary | ICD-10-CM

## 2024-06-15 DIAGNOSIS — M159 Polyosteoarthritis, unspecified: Secondary | ICD-10-CM | POA: Diagnosis present

## 2024-06-15 DIAGNOSIS — Z90710 Acquired absence of both cervix and uterus: Secondary | ICD-10-CM

## 2024-06-15 DIAGNOSIS — L89896 Pressure-induced deep tissue damage of other site: Secondary | ICD-10-CM | POA: Diagnosis not present

## 2024-06-15 DIAGNOSIS — Z79899 Other long term (current) drug therapy: Secondary | ICD-10-CM

## 2024-06-15 DIAGNOSIS — G309 Alzheimer's disease, unspecified: Secondary | ICD-10-CM | POA: Diagnosis present

## 2024-06-15 DIAGNOSIS — S065XAA Traumatic subdural hemorrhage with loss of consciousness status unknown, initial encounter: Secondary | ICD-10-CM | POA: Diagnosis present

## 2024-06-15 DIAGNOSIS — M179 Osteoarthritis of knee, unspecified: Secondary | ICD-10-CM | POA: Diagnosis present

## 2024-06-15 DIAGNOSIS — W19XXXA Unspecified fall, initial encounter: Principal | ICD-10-CM

## 2024-06-15 DIAGNOSIS — L89316 Pressure-induced deep tissue damage of right buttock: Secondary | ICD-10-CM | POA: Diagnosis present

## 2024-06-15 DIAGNOSIS — Z800 Family history of malignant neoplasm of digestive organs: Secondary | ICD-10-CM

## 2024-06-15 DIAGNOSIS — Z96611 Presence of right artificial shoulder joint: Secondary | ICD-10-CM | POA: Diagnosis present

## 2024-06-15 DIAGNOSIS — S72141A Displaced intertrochanteric fracture of right femur, initial encounter for closed fracture: Secondary | ICD-10-CM

## 2024-06-15 DIAGNOSIS — M4856XA Collapsed vertebra, not elsewhere classified, lumbar region, initial encounter for fracture: Secondary | ICD-10-CM | POA: Diagnosis present

## 2024-06-15 DIAGNOSIS — W1830XA Fall on same level, unspecified, initial encounter: Secondary | ICD-10-CM | POA: Diagnosis present

## 2024-06-15 DIAGNOSIS — Z1152 Encounter for screening for COVID-19: Secondary | ICD-10-CM

## 2024-06-15 DIAGNOSIS — Z96651 Presence of right artificial knee joint: Secondary | ICD-10-CM | POA: Diagnosis present

## 2024-06-15 DIAGNOSIS — F419 Anxiety disorder, unspecified: Secondary | ICD-10-CM

## 2024-06-15 DIAGNOSIS — Z7189 Other specified counseling: Secondary | ICD-10-CM

## 2024-06-15 DIAGNOSIS — K219 Gastro-esophageal reflux disease without esophagitis: Secondary | ICD-10-CM | POA: Diagnosis present

## 2024-06-15 DIAGNOSIS — R4182 Altered mental status, unspecified: Secondary | ICD-10-CM

## 2024-06-15 DIAGNOSIS — Z515 Encounter for palliative care: Secondary | ICD-10-CM

## 2024-06-15 DIAGNOSIS — Z8249 Family history of ischemic heart disease and other diseases of the circulatory system: Secondary | ICD-10-CM

## 2024-06-15 DIAGNOSIS — Z66 Do not resuscitate: Secondary | ICD-10-CM | POA: Diagnosis present

## 2024-06-15 DIAGNOSIS — N390 Urinary tract infection, site not specified: Secondary | ICD-10-CM | POA: Diagnosis present

## 2024-06-15 DIAGNOSIS — S72041A Displaced fracture of base of neck of right femur, initial encounter for closed fracture: Secondary | ICD-10-CM

## 2024-06-15 DIAGNOSIS — F32A Depression, unspecified: Secondary | ICD-10-CM | POA: Diagnosis present

## 2024-06-15 DIAGNOSIS — F02C18 Dementia in other diseases classified elsewhere, severe, with other behavioral disturbance: Secondary | ICD-10-CM | POA: Diagnosis present

## 2024-06-15 DIAGNOSIS — S32010A Wedge compression fracture of first lumbar vertebra, initial encounter for closed fracture: Secondary | ICD-10-CM | POA: Diagnosis present

## 2024-06-15 DIAGNOSIS — I10 Essential (primary) hypertension: Secondary | ICD-10-CM | POA: Diagnosis present

## 2024-06-15 DIAGNOSIS — F02C3 Dementia in other diseases classified elsewhere, severe, with mood disturbance: Secondary | ICD-10-CM | POA: Diagnosis present

## 2024-06-15 DIAGNOSIS — Z6821 Body mass index (BMI) 21.0-21.9, adult: Secondary | ICD-10-CM

## 2024-06-15 DIAGNOSIS — F0390 Unspecified dementia without behavioral disturbance: Secondary | ICD-10-CM | POA: Diagnosis present

## 2024-06-15 LAB — CBC WITH DIFFERENTIAL/PLATELET
Abs Immature Granulocytes: 0.07 K/uL (ref 0.00–0.07)
Basophils Absolute: 0 K/uL (ref 0.0–0.1)
Basophils Relative: 0 %
Eosinophils Absolute: 0 K/uL (ref 0.0–0.5)
Eosinophils Relative: 0 %
HCT: 33 % — ABNORMAL LOW (ref 36.0–46.0)
Hemoglobin: 10.5 g/dL — ABNORMAL LOW (ref 12.0–15.0)
Immature Granulocytes: 1 %
Lymphocytes Relative: 9 %
Lymphs Abs: 0.8 K/uL (ref 0.7–4.0)
MCH: 30.4 pg (ref 26.0–34.0)
MCHC: 31.8 g/dL (ref 30.0–36.0)
MCV: 95.7 fL (ref 80.0–100.0)
Monocytes Absolute: 0.9 K/uL (ref 0.1–1.0)
Monocytes Relative: 9 %
Neutro Abs: 7.5 K/uL (ref 1.7–7.7)
Neutrophils Relative %: 81 %
Platelets: 320 K/uL (ref 150–400)
RBC: 3.45 MIL/uL — ABNORMAL LOW (ref 3.87–5.11)
RDW: 13.6 % (ref 11.5–15.5)
WBC: 9.3 K/uL (ref 4.0–10.5)
nRBC: 0 % (ref 0.0–0.2)

## 2024-06-15 LAB — COMPREHENSIVE METABOLIC PANEL WITH GFR
ALT: 24 U/L (ref 0–44)
AST: 50 U/L — ABNORMAL HIGH (ref 15–41)
Albumin: 3.2 g/dL — ABNORMAL LOW (ref 3.5–5.0)
Alkaline Phosphatase: 94 U/L (ref 38–126)
Anion gap: 10 (ref 5–15)
BUN: 35 mg/dL — ABNORMAL HIGH (ref 8–23)
CO2: 24 mmol/L (ref 22–32)
Calcium: 9 mg/dL (ref 8.9–10.3)
Chloride: 104 mmol/L (ref 98–111)
Creatinine, Ser: 0.72 mg/dL (ref 0.44–1.00)
GFR, Estimated: >60 mL/min (ref >60)
Glucose, Bld: 91 mg/dL (ref 70–99)
Potassium: 4.6 mmol/L (ref 3.5–5.1)
Sodium: 138 mmol/L (ref 135–145)
Total Bilirubin: 0.4 mg/dL (ref 0.0–1.2)
Total Protein: 6.3 g/dL — ABNORMAL LOW (ref 6.5–8.1)

## 2024-06-15 LAB — URINALYSIS, ROUTINE W REFLEX MICROSCOPIC
Bacteria, UA: NONE SEEN
Bilirubin Urine: NEGATIVE
Glucose, UA: NEGATIVE mg/dL
Hgb urine dipstick: NEGATIVE
Ketones, ur: NEGATIVE mg/dL
Leukocytes,Ua: NEGATIVE
Nitrite: NEGATIVE
Protein, ur: 30 mg/dL — AB
Specific Gravity, Urine: 1.029 (ref 1.005–1.030)
pH: 5 (ref 5.0–8.0)

## 2024-06-15 LAB — I-STAT CG4 LACTIC ACID, ED: Lactic Acid, Venous: 1.5 mmol/L (ref 0.5–1.9)

## 2024-06-15 LAB — TROPONIN T, HIGH SENSITIVITY: Troponin T High Sensitivity: 48 ng/L — ABNORMAL HIGH (ref 0–19)

## 2024-06-15 LAB — CK: Total CK: 481 U/L — ABNORMAL HIGH (ref 38–234)

## 2024-06-15 LAB — SALICYLATE LEVEL: Salicylate Lvl: <7 mg/dL — ABNORMAL LOW (ref 7.0–30.0)

## 2024-06-15 LAB — ACETAMINOPHEN LEVEL: Acetaminophen (Tylenol), Serum: <10 ug/mL — ABNORMAL LOW (ref 10–30)

## 2024-06-15 LAB — LIPASE, BLOOD: Lipase: 74 U/L — ABNORMAL HIGH (ref 11–51)

## 2024-06-15 MED ORDER — SODIUM CHLORIDE 0.9 % IV BOLUS
1000.0000 mL | Freq: Once | INTRAVENOUS | Status: AC
  Administered 2024-06-15: 1000 mL via INTRAVENOUS

## 2024-06-15 MED ORDER — LORAZEPAM 2 MG/ML IJ SOLN
1.0000 mg | Freq: Once | INTRAMUSCULAR | Status: AC
  Administered 2024-06-15: 1 mg via INTRAVENOUS
  Filled 2024-06-15: qty 1

## 2024-06-15 MED ORDER — LORAZEPAM 1 MG PO TABS
1.0000 mg | ORAL_TABLET | Freq: Once | ORAL | Status: AC
  Administered 2024-06-15: 1 mg via ORAL
  Filled 2024-06-15: qty 1

## 2024-06-15 MED ORDER — IOHEXOL 300 MG/ML  SOLN
80.0000 mL | Freq: Once | INTRAMUSCULAR | Status: AC | PRN
  Administered 2024-06-15: 80 mL via INTRAVENOUS

## 2024-06-15 NOTE — ED Provider Notes (Signed)
 [place] EMERGENCY DEPARTMENT AT [facility] Provider Note   CSN: [id number] Arrival date & time: 06/15/24  1835     Patient presents with: uti   Courtney Avila is a 83 y.o. female with history of dementia presents from home via EMS with concerns for possible UTI.  Patient reports that she has been urinating more frequently.  Denies any dysuria or abdominal pain.  She is without any other complaints.  History primarily obtained via telephone call with son, [name].  He reports that she has been more confused over the past few days.   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 Authorizing Provider  ARIPiprazole (ABILIFY) 2 MG tablet Take 2 mg by mouth at bedtime. 01/01/24   [provider]  busPIRone  (BUSPAR ) 10 MG tablet Take 1 tablet (10 mg total) by mouth 2 (two) times daily. 09/21/22   [name], NP  Ensure (ENSURE) Take 237 mLs by mouth daily.    [provider]  Multiple Vitamins-Minerals (HAIR SKIN AND NAILS FORMULA PO) Take 1 tablet by mouth daily.    [provider]  PARoxetine  (PAXIL ) 40 MG tablet Take 40 mg by mouth at bedtime. 09/30/22   [provider]  prazosin   (MINIPRESS ) 1 MG capsule Take 1 mg by mouth at bedtime.    [provider]  temazepam  (RESTORIL ) 30 MG capsule Take 30 mg by mouth at bedtime. 09/26/22   [provider]  TYLENOL  500 MG tablet Take 500 mg by mouth every 6 (six) hours as needed for mild pain or headache.    [provider]    Allergies: Patient has no known allergies.    Review of Systems  Genitourinary:  Positive for frequency.    Updated Vital Signs BP 117/64 (BP Location: Left Arm)   Pulse 72   Temp 99.5 F (37.5 C) (Rectal)   Resp 17   SpO2 93%   Physical Exam Vitals and nursing note reviewed.  Constitutional:      General: She is not in acute distress.    Appearance: She is well-developed.  HENT:     Head: Normocephalic and atraumatic.  Eyes:     Conjunctiva/sclera: Conjunctivae normal.  Cardiovascular:     Rate and Rhythm: Normal rate and regular rhythm.     Heart sounds: No murmur heard. Pulmonary:     Effort: Pulmonary effort is normal. No respiratory distress.     Breath sounds: Normal breath sounds.  Abdominal:     Palpations: Abdomen is soft.     Tenderness: There is no abdominal tenderness.  Musculoskeletal:        General:  No swelling.     Cervical back: Neck supple.  Skin:    General: Skin is warm and dry.     Capillary Refill: Capillary refill takes less than 2 seconds.  Neurological:     General: No focal deficit present.     Mental Status: She is alert.  Psychiatric:        Mood and Affect: Mood normal.     (all labs ordered are listed, but only abnormal results are displayed) Labs Reviewed  CBC WITH DIFFERENTIAL/PLATELET - Abnormal; Notable for the following components:      Result Value   RBC 3.45 (*)    Hemoglobin 10.5 (*)    HCT 33.0 (*)    All other components within normal limits  URINALYSIS, ROUTINE W REFLEX MICROSCOPIC - Abnormal; Notable for the following components:   APPearance HAZY (*)    Protein, ur 30 (*)    All other components  within normal limits  CULTURE, BLOOD (ROUTINE X 2)  CULTURE, BLOOD (ROUTINE X 2)  RESP PANEL BY RT-PCR (RSV, FLU A&B, COVID)  RVPGX2  COMPREHENSIVE METABOLIC PANEL WITH GFR  LIPASE, BLOOD  ACETAMINOPHEN  LEVEL  SALICYLATE LEVEL  CK  I-STAT CG4 LACTIC ACID, ED  I-STAT CG4 LACTIC ACID, ED  TROPONIN T, HIGH SENSITIVITY    EKG: None  Radiology: DG Chest Portable 1 View Result Date: 06/15/2024 EXAM: 1 VIEW(S) XRAY OF THE CHEST 06/15/2024 08:00:00 PM COMPARISON: 08/04/2022 CLINICAL HISTORY: AMS FINDINGS: LUNGS AND PLEURA: No focal pulmonary opacity. No pleural effusion. No pneumothorax. HEART AND MEDIASTINUM: No acute abnormality of the cardiac and mediastinal silhouettes. BONES AND SOFT TISSUES: No acute osseous abnormality. IMPRESSION: 1. No acute cardiopulmonary process identified. Electronically signed by: Franky Crease MD 06/15/2024 08:04 PM EST RP Workstation: [id number]     Procedures   Medications Ordered in the ED  LORazepam  (ATIVAN ) tablet 1 mg (has no administration in time range)  sodium chloride  0.9 % bolus 1,000 mL (1,000 mLs Intravenous New Bag/Given 06/15/24 2154)    Clinical Course as of 06/15/24 2226  Thu Jun 15, 2024  1946 Patient evaluated for concerns for possible UTI and increased confusion over the past few days.  Upon arrival patient is hemodynamically stable.  Patient is alert to place and self.  Her exam is benign.  Will obtain routine labs for further evaluation. [JT]  2017 CBC with Differential(!) No leukocytosis, hemoglobin stayed [JT]  2123 I-Stat CG4 Lactic Acid Within normal limits [JT]  2209 Sent by family for concern for UTI. Family reported fall 2 days ago, reporting groin pain and not walking, but is not guarding and no peroneal injury noted, legs in constant flexed position. Has some ecchymosis at L knee. Needing imaging, providing antipsychotics. Dementia at baseline, but is able to talk normally. However has been increasingly confused. A/ox 2 to  self and place. Anticipate admit. Has good family support at home.   [CB]  2215 Discussed patient with grandson.  Apparently she had a fall 3 days ago in which she found her on the ground.  Up to her into the bed.  She has been minimally ambulatory since.  She was complaining of groin pain which is why he felt that she had a UTI.  He noted some bruising on her knee as well.  States that typically she is talkative and able to maintain a normal conversation.  Chart review demonstrates that she has intermittently had episodes of alteration, attributed to her dementia.  Given the acuity of her  symptoms following a fall we will obtain additional imaging. [JT]    Clinical Course User Index [CB] Beola Terrall RAMAN, PA-C [JT] Donnajean Lynwood DEL, PA-C                                 Medical Decision Making Amount and/or Complexity of Data Reviewed Labs: ordered. Decision-making details documented in ED Course. Radiology: ordered.   This patient presents to the ED with chief complaint(s) of UTI .  The complaint involves an extensive differential diagnosis and also carries with it a high risk of complications and morbidity.   Pertinent past medical history as listed in HPI  The differential diagnosis includes  UTI, pyelonephritis, pneumonia Additional history obtained: Records reviewed Care Everywhere/External Records  Disposition:   Signout given to [facility] [place], PA-C.  Please see his note for the visit.  Disposition pending workup.  Social Determinants of Health:   none  This note was dictated with voice recognition software.  Despite best efforts at proofreading, errors may have occurred which can change the documentation meaning.       Final diagnoses:  Fall, initial encounter  Altered mental status, unspecified altered mental status type    ED Discharge Orders     None          Donnajean Lynwood DEL DEVONNA 06/15/24 2226    Patt Alm Macho, MD 06/15/24 2303

## 2024-06-15 NOTE — ED Triage Notes (Signed)
 Pt bib ems from home due to son calling out from pt c/o of generalized pain - pt fell 2 days ago - uti symptoms starting 2 days ago - denies thinners  Hx dementia unsure of baseline -currently  a/o self and place BP 180/80 CBG 186

## 2024-06-16 ENCOUNTER — Inpatient Hospital Stay (HOSPITAL_COMMUNITY)

## 2024-06-16 ENCOUNTER — Inpatient Hospital Stay (HOSPITAL_COMMUNITY): Admitting: Anesthesiology

## 2024-06-16 ENCOUNTER — Encounter (HOSPITAL_COMMUNITY)
Admission: EM | Disposition: A | Discharge status: Hospice | Payer: Self-pay | Source: Home / Self Care | Attending: Internal Medicine

## 2024-06-16 ENCOUNTER — Encounter (HOSPITAL_COMMUNITY): Payer: Self-pay | Admitting: Internal Medicine

## 2024-06-16 DIAGNOSIS — I10 Essential (primary) hypertension: Secondary | ICD-10-CM | POA: Diagnosis present

## 2024-06-16 DIAGNOSIS — S72041A Displaced fracture of base of neck of right femur, initial encounter for closed fracture: Secondary | ICD-10-CM

## 2024-06-16 DIAGNOSIS — F0390 Unspecified dementia without behavioral disturbance: Secondary | ICD-10-CM | POA: Diagnosis present

## 2024-06-16 DIAGNOSIS — S065XAA Traumatic subdural hemorrhage with loss of consciousness status unknown, initial encounter: Secondary | ICD-10-CM | POA: Diagnosis present

## 2024-06-16 DIAGNOSIS — Z01810 Encounter for preprocedural cardiovascular examination: Secondary | ICD-10-CM | POA: Diagnosis not present

## 2024-06-16 DIAGNOSIS — N390 Urinary tract infection, site not specified: Secondary | ICD-10-CM | POA: Diagnosis present

## 2024-06-16 DIAGNOSIS — Z8249 Family history of ischemic heart disease and other diseases of the circulatory system: Secondary | ICD-10-CM | POA: Diagnosis not present

## 2024-06-16 DIAGNOSIS — L89896 Pressure-induced deep tissue damage of other site: Secondary | ICD-10-CM | POA: Diagnosis not present

## 2024-06-16 DIAGNOSIS — Z515 Encounter for palliative care: Secondary | ICD-10-CM | POA: Diagnosis not present

## 2024-06-16 DIAGNOSIS — G4700 Insomnia, unspecified: Secondary | ICD-10-CM | POA: Diagnosis present

## 2024-06-16 DIAGNOSIS — Z66 Do not resuscitate: Secondary | ICD-10-CM | POA: Diagnosis present

## 2024-06-16 DIAGNOSIS — K219 Gastro-esophageal reflux disease without esophagitis: Secondary | ICD-10-CM | POA: Diagnosis present

## 2024-06-16 DIAGNOSIS — R64 Cachexia: Secondary | ICD-10-CM | POA: Diagnosis present

## 2024-06-16 DIAGNOSIS — S72141G Displaced intertrochanteric fracture of right femur, subsequent encounter for closed fracture with delayed healing: Secondary | ICD-10-CM | POA: Diagnosis not present

## 2024-06-16 DIAGNOSIS — F32A Depression, unspecified: Secondary | ICD-10-CM | POA: Diagnosis present

## 2024-06-16 DIAGNOSIS — S72141A Displaced intertrochanteric fracture of right femur, initial encounter for closed fracture: Secondary | ICD-10-CM | POA: Diagnosis present

## 2024-06-16 DIAGNOSIS — S32010A Wedge compression fracture of first lumbar vertebra, initial encounter for closed fracture: Secondary | ICD-10-CM | POA: Diagnosis present

## 2024-06-16 DIAGNOSIS — W19XXXA Unspecified fall, initial encounter: Secondary | ICD-10-CM | POA: Diagnosis not present

## 2024-06-16 DIAGNOSIS — L89611 Pressure ulcer of right heel, stage 1: Secondary | ICD-10-CM | POA: Diagnosis present

## 2024-06-16 DIAGNOSIS — L89316 Pressure-induced deep tissue damage of right buttock: Secondary | ICD-10-CM | POA: Diagnosis present

## 2024-06-16 DIAGNOSIS — R627 Adult failure to thrive: Secondary | ICD-10-CM | POA: Diagnosis present

## 2024-06-16 DIAGNOSIS — F02C0 Dementia in other diseases classified elsewhere, severe, without behavioral disturbance, psychotic disturbance, mood disturbance, and anxiety: Secondary | ICD-10-CM | POA: Diagnosis not present

## 2024-06-16 DIAGNOSIS — M4856XA Collapsed vertebra, not elsewhere classified, lumbar region, initial encounter for fracture: Secondary | ICD-10-CM | POA: Diagnosis present

## 2024-06-16 DIAGNOSIS — F02C18 Dementia in other diseases classified elsewhere, severe, with other behavioral disturbance: Secondary | ICD-10-CM | POA: Diagnosis present

## 2024-06-16 DIAGNOSIS — F02C3 Dementia in other diseases classified elsewhere, severe, with mood disturbance: Secondary | ICD-10-CM | POA: Diagnosis present

## 2024-06-16 DIAGNOSIS — G309 Alzheimer's disease, unspecified: Secondary | ICD-10-CM | POA: Diagnosis present

## 2024-06-16 DIAGNOSIS — D62 Acute posthemorrhagic anemia: Secondary | ICD-10-CM | POA: Diagnosis not present

## 2024-06-16 DIAGNOSIS — Z96611 Presence of right artificial shoulder joint: Secondary | ICD-10-CM | POA: Diagnosis present

## 2024-06-16 DIAGNOSIS — F02C4 Dementia in other diseases classified elsewhere, severe, with anxiety: Secondary | ICD-10-CM | POA: Diagnosis present

## 2024-06-16 DIAGNOSIS — Z1152 Encounter for screening for COVID-19: Secondary | ICD-10-CM | POA: Diagnosis not present

## 2024-06-16 DIAGNOSIS — W1830XA Fall on same level, unspecified, initial encounter: Secondary | ICD-10-CM | POA: Diagnosis present

## 2024-06-16 LAB — RESP PANEL BY RT-PCR (RSV, FLU A&B, COVID)  RVPGX2
Influenza A by PCR: NEGATIVE
Influenza B by PCR: NEGATIVE
Resp Syncytial Virus by PCR: NEGATIVE
SARS Coronavirus 2 by RT PCR: NEGATIVE

## 2024-06-16 LAB — BLOOD CULTURE ID PANEL (REFLEXED) - BCID2

## 2024-06-16 LAB — CBC WITH DIFFERENTIAL/PLATELET
Abs Immature Granulocytes: 0.03 K/uL (ref 0.00–0.07)
Basophils Absolute: 0 K/uL (ref 0.0–0.1)
Basophils Relative: 1 %
Eosinophils Absolute: 0.1 K/uL (ref 0.0–0.5)
Eosinophils Relative: 2 %
HCT: 28.2 % — ABNORMAL LOW (ref 36.0–46.0)
Hemoglobin: 9.1 g/dL — ABNORMAL LOW (ref 12.0–15.0)
Immature Granulocytes: 0 %
Lymphocytes Relative: 14 %
Lymphs Abs: 1 K/uL (ref 0.7–4.0)
MCH: 30.6 pg (ref 26.0–34.0)
MCHC: 32.3 g/dL (ref 30.0–36.0)
MCV: 94.9 fL (ref 80.0–100.0)
Monocytes Absolute: 0.8 K/uL (ref 0.1–1.0)
Monocytes Relative: 11 %
Neutro Abs: 5.2 K/uL (ref 1.7–7.7)
Neutrophils Relative %: 72 %
Platelets: 335 K/uL (ref 150–400)
RBC: 2.97 MIL/uL — ABNORMAL LOW (ref 3.87–5.11)
RDW: 13.5 % (ref 11.5–15.5)
WBC: 7.1 K/uL (ref 4.0–10.5)
nRBC: 0 % (ref 0.0–0.2)

## 2024-06-16 LAB — COMPREHENSIVE METABOLIC PANEL WITH GFR
ALT: 23 U/L (ref 0–44)
ALT: 22 U/L (ref 0–44)
AST: 50 U/L — ABNORMAL HIGH (ref 15–41)
AST: 46 U/L — ABNORMAL HIGH (ref 15–41)
Albumin: 3 g/dL — ABNORMAL LOW (ref 3.5–5.0)
Albumin: 3.1 g/dL — ABNORMAL LOW (ref 3.5–5.0)
Alkaline Phosphatase: 86 U/L (ref 38–126)
Alkaline Phosphatase: 68 U/L (ref 38–126)
Anion gap: 9 (ref 5–15)
Anion gap: 8 (ref 5–15)
BUN: 22 mg/dL (ref 8–23)
BUN: 20 mg/dL (ref 8–23)
CO2: 24 mmol/L (ref 22–32)
CO2: 25 mmol/L (ref 22–32)
Calcium: 8.6 mg/dL — ABNORMAL LOW (ref 8.9–10.3)
Calcium: 8.4 mg/dL — ABNORMAL LOW (ref 8.9–10.3)
Chloride: 106 mmol/L (ref 98–111)
Chloride: 104 mmol/L (ref 98–111)
Creatinine, Ser: 0.58 mg/dL (ref 0.44–1.00)
Creatinine, Ser: 0.67 mg/dL (ref 0.44–1.00)
GFR, Estimated: >60 mL/min (ref >60)
GFR, Estimated: >60 mL/min (ref >60)
Glucose, Bld: 113 mg/dL — ABNORMAL HIGH (ref 70–99)
Glucose, Bld: 158 mg/dL — ABNORMAL HIGH (ref 70–99)
Potassium: 4 mmol/L (ref 3.5–5.1)
Potassium: 4.8 mmol/L (ref 3.5–5.1)
Sodium: 139 mmol/L (ref 135–145)
Sodium: 137 mmol/L (ref 135–145)
Total Bilirubin: 0.5 mg/dL (ref 0.0–1.2)
Total Bilirubin: 0.4 mg/dL (ref 0.0–1.2)
Total Protein: 5.9 g/dL — ABNORMAL LOW (ref 6.5–8.1)
Total Protein: 5.3 g/dL — ABNORMAL LOW (ref 6.5–8.1)

## 2024-06-16 LAB — TROPONIN T, HIGH SENSITIVITY
Troponin T High Sensitivity: 49 ng/L — ABNORMAL HIGH (ref 0–19)
Troponin T High Sensitivity: 53 ng/L — ABNORMAL HIGH (ref 0–19)

## 2024-06-16 LAB — PROTIME-INR
INR: 1.1 (ref 0.8–1.2)
Prothrombin Time: 14.7 s (ref 11.4–15.2)

## 2024-06-16 LAB — CK: Total CK: 461 U/L — ABNORMAL HIGH (ref 38–234)

## 2024-06-16 MED ORDER — METHOCARBAMOL 500 MG PO TABS
500.0000 mg | ORAL_TABLET | Freq: Four times a day (QID) | ORAL | Status: DC | PRN
  Administered 2024-06-17 – 2024-07-14 (×34): 500 mg via ORAL
  Filled 2024-06-16 (×29): qty 1

## 2024-06-16 MED ORDER — LIDOCAINE HCL (PF) 2 % IJ SOLN
INTRAMUSCULAR | Status: DC | PRN
  Administered 2024-06-16: 20 mg via INTRADERMAL

## 2024-06-16 MED ORDER — MORPHINE SULFATE (PF) 2 MG/ML IV SOLN
2.0000 mg | INTRAVENOUS | Status: DC | PRN
  Filled 2024-06-16: qty 1

## 2024-06-16 MED ORDER — PROPOFOL 500 MG/50ML IV EMUL
INTRAVENOUS | Status: DC | PRN
  Administered 2024-06-16: 100 ug/kg/min via INTRAVENOUS

## 2024-06-16 MED ORDER — HYDROCODONE-ACETAMINOPHEN 5-325 MG PO TABS
1.0000 | ORAL_TABLET | ORAL | Status: DC | PRN
  Administered 2024-06-17: 2 via ORAL
  Administered 2024-06-17: 1 via ORAL
  Filled 2024-06-16: qty 2
  Filled 2024-06-16: qty 1

## 2024-06-16 MED ORDER — SODIUM CHLORIDE 0.9 % IR SOLN
Status: DC | PRN
  Administered 2024-06-16: 1000 mL

## 2024-06-16 MED ORDER — ARIPIPRAZOLE 2 MG PO TABS
2.0000 mg | ORAL_TABLET | Freq: Every day | ORAL | Status: DC
  Administered 2024-06-17: 2 mg via ORAL
  Filled 2024-06-16: qty 1

## 2024-06-16 MED ORDER — HYDROCODONE-ACETAMINOPHEN 7.5-325 MG PO TABS: 1.0000 | ORAL_TABLET | ORAL | Status: DC | PRN

## 2024-06-16 MED ORDER — ONDANSETRON HCL 4 MG/2ML IJ SOLN
4.0000 mg | Freq: Four times a day (QID) | INTRAMUSCULAR | Status: DC | PRN
  Administered 2024-06-16: 4 mg via INTRAVENOUS
  Filled 2024-06-16: qty 2

## 2024-06-16 MED ORDER — ALBUMIN HUMAN 5 % IV SOLN
INTRAVENOUS | Status: AC
  Filled 2024-06-16: qty 500

## 2024-06-16 MED ORDER — SODIUM CHLORIDE 0.9 % IV SOLN
1.0000 g | INTRAVENOUS | Status: AC
  Administered 2024-06-16 – 2024-06-18 (×3): 1 g via INTRAVENOUS
  Filled 2024-06-16 (×3): qty 10

## 2024-06-16 MED ORDER — ROCURONIUM BROMIDE 10 MG/ML (PF) SYRINGE
PREFILLED_SYRINGE | INTRAVENOUS | Status: DC | PRN
  Administered 2024-06-16: 30 mg via INTRAVENOUS
  Administered 2024-06-16: 20 mg via INTRAVENOUS
  Administered 2024-06-16 (×2): 10 mg via INTRAVENOUS

## 2024-06-16 MED ORDER — TRANEXAMIC ACID-NACL 1000-0.7 MG/100ML-% IV SOLN
1000.0000 mg | INTRAVENOUS | Status: AC
  Administered 2024-06-16: 1000 mg via INTRAVENOUS

## 2024-06-16 MED ORDER — ONDANSETRON HCL 4 MG/2ML IJ SOLN
INTRAMUSCULAR | Status: DC | PRN
  Administered 2024-06-16: 4 mg via INTRAVENOUS

## 2024-06-16 MED ORDER — BUSPIRONE HCL 10 MG PO TABS
10.0000 mg | ORAL_TABLET | Freq: Two times a day (BID) | ORAL | Status: DC
  Administered 2024-06-17 – 2024-07-15 (×58): 10 mg via ORAL
  Filled 2024-06-16 (×2): qty 1
  Filled 2024-06-16: qty 2
  Filled 2024-06-16 (×3): qty 1
  Filled 2024-06-16: qty 2
  Filled 2024-06-16: qty 1
  Filled 2024-06-16: qty 2
  Filled 2024-06-16: qty 1
  Filled 2024-06-16: qty 2
  Filled 2024-06-16 (×5): qty 1
  Filled 2024-06-16: qty 2
  Filled 2024-06-16: qty 1
  Filled 2024-06-16 (×3): qty 2
  Filled 2024-06-16: qty 1
  Filled 2024-06-16: qty 2
  Filled 2024-06-16: qty 1
  Filled 2024-06-16 (×2): qty 2
  Filled 2024-06-16 (×2): qty 1
  Filled 2024-06-16: qty 2
  Filled 2024-06-16 (×9): qty 1
  Filled 2024-06-16: qty 2
  Filled 2024-06-16 (×5): qty 1

## 2024-06-16 MED ORDER — CEFAZOLIN SODIUM-DEXTROSE 2-4 GM/100ML-% IV SOLN
2.0000 g | INTRAVENOUS | Status: AC
  Administered 2024-06-16: 2 g via INTRAVENOUS

## 2024-06-16 MED ORDER — PROPOFOL 1000 MG/100ML IV EMUL
INTRAVENOUS | Status: AC
  Filled 2024-06-16: qty 100

## 2024-06-16 MED ORDER — FENTANYL CITRATE (PF) 100 MCG/2ML IJ SOLN
INTRAMUSCULAR | Status: AC
  Filled 2024-06-16: qty 2

## 2024-06-16 MED ORDER — BISACODYL 10 MG RE SUPP: 10.0000 mg | Freq: Once | RECTAL | Status: DC

## 2024-06-16 MED ORDER — DEXTROSE IN LACTATED RINGERS 5 % IV SOLN: INTRAVENOUS | Status: AC

## 2024-06-16 MED ORDER — ONDANSETRON HCL 4 MG/2ML IJ SOLN
INTRAMUSCULAR | Status: AC
  Filled 2024-06-16: qty 2

## 2024-06-16 MED ORDER — PROPOFOL 10 MG/ML IV BOLUS
INTRAVENOUS | Status: DC | PRN
  Administered 2024-06-16: 60 mg via INTRAVENOUS

## 2024-06-16 MED ORDER — LACTATED RINGERS IV SOLN: INTRAVENOUS | Status: DC | PRN

## 2024-06-16 MED ORDER — DEXMEDETOMIDINE HCL IN NACL 80 MCG/20ML IV SOLN
INTRAVENOUS | Status: DC | PRN
  Administered 2024-06-16: 8 ug via INTRAVENOUS

## 2024-06-16 MED ORDER — TRANEXAMIC ACID-NACL 1000-0.7 MG/100ML-% IV SOLN
INTRAVENOUS | Status: AC
  Filled 2024-06-16: qty 100

## 2024-06-16 MED ORDER — AMISULPRIDE (ANTIEMETIC) 5 MG/2ML IV SOLN: 10.0000 mg | Freq: Once | INTRAVENOUS | Status: DC | PRN

## 2024-06-16 MED ORDER — DOCUSATE SODIUM 100 MG PO CAPS
100.0000 mg | ORAL_CAPSULE | Freq: Two times a day (BID) | ORAL | Status: DC
  Administered 2024-06-17 – 2024-07-15 (×49): 100 mg via ORAL
  Filled 2024-06-16 (×42): qty 1

## 2024-06-16 MED ORDER — PHENOL 1.4 % MT LIQD
1.0000 | OROMUCOSAL | Status: DC | PRN
  Administered 2024-06-24: 1 via OROMUCOSAL

## 2024-06-16 MED ORDER — FENTANYL CITRATE (PF) 50 MCG/ML IJ SOSY
25.0000 ug | PREFILLED_SYRINGE | INTRAMUSCULAR | Status: DC | PRN
  Administered 2024-06-16 (×2): 25 ug via INTRAVENOUS

## 2024-06-16 MED ORDER — ALBUMIN HUMAN 5 % IV SOLN: INTRAVENOUS | Status: DC | PRN

## 2024-06-16 MED ORDER — SODIUM CHLORIDE 0.9 % IV SOLN: INTRAVENOUS | Status: DC

## 2024-06-16 MED ORDER — ACETAMINOPHEN 650 MG RE SUPP: 650.0000 mg | Freq: Four times a day (QID) | RECTAL | Status: DC | PRN

## 2024-06-16 MED ORDER — CEFAZOLIN SODIUM-DEXTROSE 2-4 GM/100ML-% IV SOLN
2.0000 g | Freq: Four times a day (QID) | INTRAVENOUS | Status: AC
  Administered 2024-06-16 – 2024-06-17 (×2): 2 g via INTRAVENOUS
  Filled 2024-06-16 (×2): qty 100

## 2024-06-16 MED ORDER — STERILE WATER FOR IRRIGATION IR SOLN
Status: DC | PRN
  Administered 2024-06-16: 2000 mL

## 2024-06-16 MED ORDER — FENTANYL CITRATE (PF) 100 MCG/2ML IJ SOLN
INTRAMUSCULAR | Status: DC | PRN
  Administered 2024-06-16: 25 ug via INTRAVENOUS
  Administered 2024-06-16 (×3): 50 ug via INTRAVENOUS
  Administered 2024-06-16: 25 ug via INTRAVENOUS

## 2024-06-16 MED ORDER — TEMAZEPAM 15 MG PO CAPS
30.0000 mg | ORAL_CAPSULE | Freq: Every day | ORAL | Status: DC
  Administered 2024-06-17 – 2024-07-14 (×27): 30 mg via ORAL
  Filled 2024-06-16 (×3): qty 2
  Filled 2024-06-16: qty 1
  Filled 2024-06-16 (×4): qty 2
  Filled 2024-06-16: qty 1
  Filled 2024-06-16 (×11): qty 2

## 2024-06-16 MED ORDER — DEXAMETHASONE SOD PHOSPHATE PF 10 MG/ML IJ SOLN
INTRAMUSCULAR | Status: DC | PRN
  Administered 2024-06-16: 10 mg via INTRAVENOUS

## 2024-06-16 MED ORDER — ONDANSETRON HCL 4 MG PO TABS: 4.0000 mg | ORAL_TABLET | Freq: Four times a day (QID) | ORAL | Status: DC | PRN

## 2024-06-16 MED ORDER — PAROXETINE HCL 20 MG PO TABS
40.0000 mg | ORAL_TABLET | Freq: Every day | ORAL | Status: DC
  Administered 2024-06-17 – 2024-07-14 (×29): 40 mg via ORAL
  Filled 2024-06-16 (×22): qty 2

## 2024-06-16 MED ORDER — CEFAZOLIN SODIUM-DEXTROSE 2-4 GM/100ML-% IV SOLN
INTRAVENOUS | Status: AC
  Filled 2024-06-16: qty 100

## 2024-06-16 MED ORDER — SUGAMMADEX SODIUM 200 MG/2ML IV SOLN
INTRAVENOUS | Status: DC | PRN
  Administered 2024-06-16: 100 mg via INTRAVENOUS

## 2024-06-16 MED ORDER — MENTHOL 3 MG MT LOZG: 1.0000 | LOZENGE | OROMUCOSAL | Status: DC | PRN

## 2024-06-16 MED ORDER — MORPHINE SULFATE (PF) 2 MG/ML IV SOLN
0.5000 mg | INTRAVENOUS | Status: DC | PRN
  Administered 2024-06-16: 0.5 mg via INTRAVENOUS
  Filled 2024-06-16: qty 1

## 2024-06-16 MED ORDER — PHENYLEPHRINE 80 MCG/ML (10ML) SYRINGE FOR IV PUSH (FOR BLOOD PRESSURE SUPPORT)
PREFILLED_SYRINGE | INTRAVENOUS | Status: DC | PRN
  Administered 2024-06-16: 160 ug via INTRAVENOUS
  Administered 2024-06-16: 80 ug via INTRAVENOUS

## 2024-06-16 MED ORDER — METOCLOPRAMIDE HCL 5 MG PO TABS: 5.0000 mg | ORAL_TABLET | Freq: Three times a day (TID) | ORAL | Status: DC | PRN

## 2024-06-16 MED ORDER — ACETAMINOPHEN 325 MG PO TABS: 325.0000 mg | ORAL_TABLET | Freq: Four times a day (QID) | ORAL | Status: DC | PRN

## 2024-06-16 MED ORDER — ACETAMINOPHEN 325 MG PO TABS
650.0000 mg | ORAL_TABLET | Freq: Four times a day (QID) | ORAL | Status: DC | PRN
  Administered 2024-07-03: 650 mg via ORAL
  Filled 2024-06-16 (×2): qty 2

## 2024-06-16 MED ORDER — FENTANYL CITRATE (PF) 50 MCG/ML IJ SOSY
PREFILLED_SYRINGE | INTRAMUSCULAR | Status: AC
  Filled 2024-06-16: qty 1

## 2024-06-16 MED ORDER — METOCLOPRAMIDE HCL 5 MG/ML IJ SOLN: 5.0000 mg | Freq: Three times a day (TID) | INTRAMUSCULAR | Status: DC | PRN

## 2024-06-16 MED ORDER — METHOCARBAMOL 1000 MG/10ML IJ SOLN
500.0000 mg | Freq: Four times a day (QID) | INTRAMUSCULAR | Status: DC | PRN
  Administered 2024-06-17: 500 mg via INTRAVENOUS
  Filled 2024-06-16: qty 10

## 2024-06-16 MED ADMIN — Sodium Chloride Irrigation Soln 0.9%: 1000 mL | NDC 99999050048

## 2024-06-16 MED FILL — Sugammadex Sodium IV 200 MG/2ML (Base Equivalent): INTRAVENOUS | Qty: 2 | Status: AC

## 2024-06-16 MED FILL — Lidocaine HCl Local Preservative Free (PF) Inj 2%: INTRAMUSCULAR | Qty: 5 | Status: AC

## 2024-06-16 MED FILL — Rocuronium Bromide IV Soln Pref Syr 100 MG/10ML (10 MG/ML): INTRAVENOUS | Qty: 10 | Status: AC

## 2024-06-16 NOTE — Anesthesia Preprocedure Evaluation (Addendum)
 Anesthesia Evaluation  Patient identified by MRN, date of birth, ID band Patient confused    Reviewed: Allergy & Precautions, NPO status , Patient's Chart, lab work & pertinent test results  Airway Mallampati: II  TM Distance: >3 FB Neck ROM: Full    Dental  (+) Dental Advisory Given   Pulmonary neg pulmonary ROS   breath sounds clear to auscultation       Cardiovascular negative cardio ROS  Rhythm:Regular Rate:Normal     Neuro/Psych       Dementia negative neurological ROS     GI/Hepatic Neg liver ROS,GERD  ,,  Endo/Other  negative endocrine ROS    Renal/GU negative Renal ROS     Musculoskeletal  (+) Arthritis ,    Abdominal   Peds  Hematology  (+) Blood dyscrasia, anemia   Anesthesia Other Findings   Reproductive/Obstetrics                              Anesthesia Physical Anesthesia Plan  ASA: 3  Anesthesia Plan: General   Post-op Pain Management: Ofirmev  IV (intra-op)*   Induction: Intravenous  PONV Risk Score and Plan: 3 and Dexamethasone , Ondansetron , Treatment may vary due to age or medical condition, Propofol  infusion and TIVA  Airway Management Planned: Oral ETT  Additional Equipment:   Intra-op Plan:   Post-operative Plan: Extubation in OR  Informed Consent: I have reviewed the patients History and Physical, chart, labs and discussed the procedure including the risks, benefits and alternatives for the proposed anesthesia with the patient or authorized representative who has indicated his/her understanding and acceptance.   Patient has DNR.  Discussed DNR with power of attorney and Continue DNR.   Dental advisory given and Consent reviewed with POA  Plan Discussed with:   Anesthesia Plan Comments:          Anesthesia Quick Evaluation

## 2024-06-16 NOTE — Op Note (Signed)
 Operative Note  Date of operation: 06/16/2024 Preoperative diagnosis: Right hip low/base femoral neck fracture Postoperative diagnosis: Same  Procedure: Right direct anterior total hip arthroplasty  Implants: Implant Name Type Inv. Item Serial No. Manufacturer Lot No. LRB No. Used Action  CUP ACET PINNACLE SECTR - [id number] Joint CUP ACET PINNACLE SECTR  [employer] [id number] Right 1 Implanted  SCREW 6.5MMX25MM - [id number] Screw SCREW 6.5MMX25MM  [employer]PU[employer] [id number] Right 1 Implanted  SCREW 6.5MMX25MM - [id number] Screw SCREW 6.5MMX25MM  [employer]PU[employer] [id number] Right 1 Implanted  LINER ACET 32X48 - [id number] Liner LINER ACET 32X48  [employer]PU[employer] [id number] Right 1 Implanted  STEM CORAIL KA12 - [id number] Stem STEM CORAIL KA12  DEPU[employer] [id number] Right 1 Implanted  HEAD FEM STD 32X+1 STRL - [id number] Hips HEAD FEM STD 32X+1 STRL  [employer] [id number] Right 1 Implanted   Surgeon: Lonni GRADE. Vernetta, MD Assistant: Tory Gaskins, PA-C  Anesthesia: General Antibiotics: IV Ancef  EBL: 500 cc Complications: None  Indications: The patient is an 83 year old with dementia but she does ambulate minimally and lives at home with her son.  According to her grandson that I talked to she does take steps and gets on the couch and sits down.  Her mobility is minimal but she does mobilize.  She apparently had a mechanical fall potentially a few days ago.  Given her dementia she only complained of a burning sensation and they thought maybe she had a urinary tract infection.  She is brought to the emergency room last evening and found to have a subdural hematoma that was stable as well as a small spine fracture and a low right femoral neck fracture that was almost intertrochanteric in nature.  This was seen with plain films and CT scan.  I was asked to take over her care since I am operating at [facility] today.  When reviewing her films I did feel that there was  potential for an intramedullary nail construct to treat this fracture with an interlocking compression screw/lag screw construct that would help with rotation.  We felt it was appropriate to proceed to surgery for fixation as described.  The risk and benefits of this were discussed with family and informed consent was obtained over the phone with her son.  Procedure description: After informed consent was obtained and the appropriate right hip was marked, the patient was brought to the operating room and general anesthesia was obtained while she was on the stretcher.  A traction boot was placed on her right foot and then she was placed supine on the fracture table with a perineal post in place and her right operative leg in inline skeletal traction in the left hip flexed and abducted.  It was noted she had significant contractures all of her body and anesthesia had to provide muscle relaxant as well.  We then assessed her fracture radiographically before proceeding with surgery.  We pulled maximum traction is much as we could on her right hip assessing the fracture under fluoroscopy and the fracture would not reduce.  It looks like that the femoral neck section was impacted into the trochanteric area and likely the patient been walking on this fracture for a while and there is significant contraction.  Based on seeing this, there is no way this type of injury can be fixed with an intramedullary rod and hip screw and would be too difficult.  Thus it was felt the best treatment for this type of fracture would be a hemiarthroplasty  versus a total hip replacement.  I did get the patient's grandson on the phone and explained this to him in detail.  Her son was apparently going into A-fib and could not answer the phone and so we thought was appropriate to obtain consent from her grandson.  He felt it was appropriate that we proceed with a hip replacement since his grandmother does walk.  He is a very aware of her  situation and the care and what we are describing in terms of risk and benefits of the surgery and still wishes for us  to proceed.  We then kept her on the fracture table and added the well leg holder for performing anterior hip surgery to her left side.  We then prepped the right hip with DuraPrep and sterile drapes.  A timeout was called and she is notified as correct patient and correct right hip.  We then made incision just inferior and posterior to the ASIS and carried slightly obliquely down the leg for an anterior approach to the hip.  We dissected down the tensor fascia lata and divided the tensor fascia longitudinally to proceed with an anterior approach.  We then identified and cauterized circumflex vessels.  We defy the hip capsule and open of the hip capsule finding significant impaction of a low femoral neck fracture with some components of involving the trochanteric area.  It took a while to free all this up to get to the femoral head but we were able to do this and used a oscillating saw to make the cut just at the level of the calcar.  It took some time remove the femoral head we had to remove it in pieces.  Again there is significant traction going on side believe this fracture is been going on for a while.  We then reamed the acetabulum after removing debris and labrum from the acetabular.  Reaming was all done under direct fluoroscopy and visualization.  We did place bone graft material from the femoral head deep into the acetabulum and reamed up to a size 46 reamer and then placed a size 48 DePuy sector GRIPTION asked our component followed by 2 dome screws.  We then placed a 32+0 polythene liner.  Attention was then turned the femur.  With the right leg externally rotated to 130 degrees, extended and adducted, medial retractors placed medially and Hohmann tractor by the greater trochanter.  The lateral joint capsule was released and a box cutting osteotome was used in the femoral canal.  We then  broached using the Corail broaching system from De[employer] and broached up to a size 12 femoral component.  We then trialed a 32+1 trial head ball.  It was very difficult to reduce due to her contracture but we were able to reduce the hip and then viewed this under direct fluoroscopy and we are pleased with the reduction and the component placement.  We then dislocated the hip through the trial components.  We then placed the real Corail femoral component with standard offset size 12 and the real 32+1 metal head ball.  Again this was very difficult to reduce in the pelvis I did leave along we felt that is appropriate for someone who has such significant dementia.  Again the final reduction and implant placement was verified under fluoroscopy.  The soft tissue was then irrigated with normal saline solution.  Parts of the joint capsule were closed with interrupted #1 Ethibond suture followed by #1 Vicryl to close the tensor fascia.  0 Vicryl is used to close deep tissue and 2-0 Vicryl used to close subcutaneous tissue.  The skin was closed with staples.  An Aquacel dressing was applied.  We did place a knee immobilizer knee.  She was taken off the Hana table, awakened, extubated and taken the recovery room.  Tory Gaskins, PA-C did assist during the crucial parts of this case especially with implant placement and his assistance was crucial and medically necessary for completing this case.

## 2024-06-16 NOTE — Anesthesia Procedure Notes (Signed)
 Procedure Name: Intubation Date/Time: 06/16/2024 2:35 PM  Performed by: Zulema Leita PARAS, CRNAPre-anesthesia Checklist: Patient identified, Emergency Drugs available, Suction available and Patient being monitored Patient Re-evaluated:Patient Re-evaluated prior to induction Oxygen Delivery Method: Circle system utilized Preoxygenation: Pre-oxygenation with 100% oxygen Induction Type: IV induction Ventilation: Mask ventilation without difficulty Laryngoscope Size: Mac and 4 Grade View: Grade I Tube type: Oral Tube size: 7.0 mm Number of attempts: 1 Airway Equipment and Method: Stylet Placement Confirmation: ETT inserted through vocal cords under direct vision, positive ETCO2 and breath sounds checked- equal and bilateral Secured at: 21 cm Tube secured with: Tape Dental Injury: Teeth and Oropharynx as per pre-operative assessment

## 2024-06-16 NOTE — Progress Notes (Signed)
 PHARMACY - PHYSICIAN COMMUNICATION CRITICAL VALUE ALERT - BLOOD CULTURE IDENTIFICATION (BCID)  Courtney Avila is an 83 y.o. female who presented to [facility] on 06/15/2024 with a chief complaint of UTI  Assessment:  1/2 blood culture bottles growing GPC clusters, BCID showing staph epi (mecA resistance detected)  Name of physician (or Provider) Contacted: Lavanda Horns  Current antibiotics: periop cefazolin , ceftriaxone  Changes to prescribed antibiotics recommended:  Given improved WBC, afebrile, and possibility for contaminant, recommend continuing with current therapy and monitoring patient's clinical status   Results for orders placed or performed during the hospital encounter of 06/15/24  Blood Culture ID Panel (Reflexed) (Collected: 06/15/2024  8:10 PM)  Result Value Ref Range   Enterococcus faecalis NOT DETECTED NOT DETECTED   Enterococcus Faecium NOT DETECTED NOT DETECTED   Listeria monocytogenes NOT DETECTED NOT DETECTED   Staphylococcus species DETECTED (A) NOT DETECTED   Staphylococcus aureus (BCID) NOT DETECTED NOT DETECTED   Staphylococcus epidermidis DETECTED (A) NOT DETECTED   Staphylococcus lugdunensis NOT DETECTED NOT DETECTED   Streptococcus species NOT DETECTED NOT DETECTED   Streptococcus agalactiae NOT DETECTED NOT DETECTED   Streptococcus pneumoniae NOT DETECTED NOT DETECTED   Streptococcus pyogenes NOT DETECTED NOT DETECTED   A.calcoaceticus-baumannii NOT DETECTED NOT DETECTED   Bacteroides fragilis NOT DETECTED NOT DETECTED   Enterobacterales NOT DETECTED NOT DETECTED   Enterobacter cloacae complex NOT DETECTED NOT DETECTED   Escherichia coli NOT DETECTED NOT DETECTED   Klebsiella aerogenes NOT DETECTED NOT DETECTED   Klebsiella oxytoca NOT DETECTED NOT DETECTED   Klebsiella pneumoniae NOT DETECTED NOT DETECTED   Proteus species NOT DETECTED NOT DETECTED   Salmonella species NOT DETECTED NOT DETECTED   Serratia marcescens NOT DETECTED NOT DETECTED    Haemophilus influenzae NOT DETECTED NOT DETECTED   Neisseria meningitidis NOT DETECTED NOT DETECTED   Pseudomonas aeruginosa NOT DETECTED NOT DETECTED   Stenotrophomonas maltophilia NOT DETECTED NOT DETECTED   Candida albicans NOT DETECTED NOT DETECTED   Candida auris NOT DETECTED NOT DETECTED   Candida glabrata NOT DETECTED NOT DETECTED   Candida krusei NOT DETECTED NOT DETECTED   Candida parapsilosis NOT DETECTED NOT DETECTED   Candida tropicalis NOT DETECTED NOT DETECTED   Cryptococcus neoformans/gattii NOT DETECTED NOT DETECTED   Methicillin resistance mecA/C DETECTED (A) NOT DETECTED    Lacinda Moats, PharmD Clinical Pharmacist  12/12/20259:09 PM

## 2024-06-16 NOTE — ED Provider Notes (Signed)
 Physical Exam  BP 121/71 (BP Location: Left Arm)   Pulse 91   Temp 98.6 F (37 C) (Oral)   Resp 17   SpO2 96%   Physical Exam Vitals and nursing note reviewed.  HENT:     Head: Normocephalic.  Eyes:     General:        Right eye: No discharge.        Left eye: No discharge.     Conjunctiva/sclera: Conjunctivae normal.  Cardiovascular:     Rate and Rhythm: Normal rate.  Pulmonary:     Effort: Pulmonary effort is normal. No respiratory distress.  Neurological:     Mental Status: She is alert. Mental status is at baseline.     Procedures  Procedures  ED Course / MDM   Clinical Course as of 06/16/24 0052  Thu Jun 15, 2024  1946 Patient evaluated for concerns for possible UTI and increased confusion over the past few days.  Upon arrival patient is hemodynamically stable.  Patient is alert to place and self.  Her exam is benign.  Will obtain routine labs for further evaluation. [JT]  2017 CBC with Differential(!) No leukocytosis, hemoglobin stayed [JT]  2123 I-Stat CG4 Lactic Acid Within normal limits [JT]  2209 Sent by family for concern for UTI. Family reported fall 3 days ago, reporting groin pain and not walking, but is not guarding and no peroneal injury noted, legs in constant flexed position. Has some ecchymosis at L knee. Needing imaging, providing antipsychotics. Dementia at baseline, but is able to talk normally. However has been increasingly confused. A/ox 2 to self and place. Anticipate admit. Has good family support at home.   [CB]  2215 Discussed patient with grandson.  Apparently she had a fall 3 days ago in which she found her on the ground.  Up to her into the bed.  She has been minimally ambulatory since.  She was complaining of groin pain which is why he felt that she had a UTI.  He noted some bruising on her knee as well.  States that typically she is talkative and able to maintain a normal conversation.  Chart review demonstrates that she has intermittently  had episodes of alteration, attributed to her dementia.  Given the acuity of her symptoms following a fall we will obtain additional imaging. [JT]  2315 Repeat ECG after IV ativan   [CB]  2359 Spoke to Radiology,Intratrochanteric right hip fracture as well as a mild superior endplate compression fracture at L1 with minimal retropulsion, L 7 mm subdural. Acute on chronic versus acute on subacute with no midline shift. [CB]  Fri Jun 16, 2024  0008 Spoke with Dr. Reyne with orthopedic surgery who requested to keep patient NPO, and noted that he will see her in the morning. [CB]  0014 Spoke to [facility] with neurosurgery who said that she should have repeat CT in 8 hours, and can use a TLSO for low back pain if she is having back pain.  Otherwise no further recommendations at this time. [CB]    Clinical Course User Index [CB] Beola Terrall RAMAN, PA-C [JT] Donnajean Lynwood DEL, PA-C   Medical Decision Making Amount and/or Complexity of Data Reviewed Labs: ordered. Decision-making details documented in ED Course. Radiology: ordered. ECG/medicine tests: ordered.  Risk Prescription drug management. Decision regarding hospitalization.   See previous provider note.  Patient care transferred over from [facility] [place].  At time of handoff, awaiting imaging.  Patient is an 83 year old female presenting ED  today after fall 3 days ago.  Reportedly having some right groin pain brought in for evaluation for UTI per family with patient notably having fluctuating baseline mentation, alert noted x 1-2.  Waiting on imaging, which showed left subdural, spoke with Johnanna with neurosurgery recommended repeat CT in 8 hours and TLSO if needed for back pain, had no preference on admission site with patient likely to be nonoperable.  Spoke with Dr. Reyne with orthopedic surgery who recommended making her n.p.o. and will see her in the morning with no further recommendations at this time.  Spoke to son,  [name] Cline and updated him on his mother's status as well as confirm that she is still a DNR.  Poke with Dr. Geronimo with hospitalist who accepted patient care at this time.       Beola Terrall RAMAN, PA-C 06/16/24 0056    Raford Lenis, MD 06/16/24 [phone]

## 2024-06-16 NOTE — ED Notes (Signed)
 Surgery is coming at 1230 per call

## 2024-06-16 NOTE — ED Notes (Signed)
 MD messaged about pain

## 2024-06-16 NOTE — H&P (Signed)
 History and Physical    Patient: Courtney Avila FMW:[id number] DOB: 10-Sep-1940 DOA: 06/15/2024 DOS: the patient was seen and examined on 06/16/2024 PCP: [name], NP  Patient coming from: Home  Chief Complaint:  Chief Complaint  Patient presents with   uti   HPI: Courtney Avila is a 83 y.o. female with medical history significant of Alzheimer's dementia, osteoarthritis, GERD, anxiety disorder, hypertension but not on medication who was brought by EMS from home with concern for possible UTI by family.  Patient apparently has had generalized pain since she had a fall 2 days ago.  She is also having pelvic pain that the family thought was possibly UTI.  She is normally ambulatory and can communicate despite advanced dementia.  She had a fall 2 days ago and since that she has been less ambulatory.  Patient was seen in the ER and evaluated.  She she appears to have a 7 mm subdural hematoma as well as right intertrochanteric fracture.  Patient equally has an L1 compression fracture all presumed to be from the fall.  Orthopedics and neurosurgery were both consulted.  Orthopedics plan possible surgical repair in the morning.  Patient to be kept n.p.o. after midnight.  Neurosurgery recommends repeat head CT in 8 hours.  No plan for possible surgery due to age.  As a result they recommend keeping patient at [place] long.  Patient is not on any blood thinners and she is a DNR.  Review of Systems: As mentioned in the history of present illness. All other systems reviewed and are negative. 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 She has never used smokeless tobacco. She reports that she does not drink alcohol and does not use drugs.  Allergies[1]  Family History  Problem Relation Age of Onset   Diabetes Mother    Hypertension Mother    Heart disease Father    Colon cancer Father    Breast cancer Sister     Prior to Admission medications  Medication Sig Start Date End Date Taking? Authorizing Provider  ARIPiprazole (ABILIFY) 2 MG tablet Take 2 mg by mouth at bedtime. 01/01/24   [provider]  busPIRone  (BUSPAR ) 10 MG tablet Take 1 tablet (10 mg total) by mouth 2 (two) times daily. 09/21/22   [name], NP  Ensure (ENSURE) Take 237 mLs by mouth daily.    [provider]  Multiple Vitamins-Minerals (HAIR SKIN AND NAILS FORMULA PO) Take 1 tablet by mouth daily.    [provider]  PARoxetine  (PAXIL ) 40 MG tablet Take 40 mg by mouth at bedtime. 09/30/22   [provider]  prazosin  (MINIPRESS ) 1 MG capsule Take 1 mg by mouth  at bedtime.    [provider]  temazepam  (RESTORIL ) 30 MG capsule Take 30 mg by mouth at bedtime. 09/26/22   [provider]  TYLENOL  500 MG tablet Take 500 mg by mouth every 6 (six) hours as needed for mild pain or headache.    [provider]    Physical Exam: Vitals:   06/15/24 1841 06/15/24 2035 06/15/24 2045 06/15/24 2357  BP: (!) 133/90 117/64  121/71  Pulse: 67 72  91  Resp: 18 17  17   Temp: (!) 97.5 F (36.4 C) 98 F (36.7 C) 99.5 F (37.5 C) 98.6 F (37 C)  TempSrc: Oral Oral Rectal Oral  SpO2: 100% 93%  96%   Constitutional: Confused, unable  to communicate, NAD, calm, comfortable Eyes: PERRL, lids and conjunctivae normal ENMT: Mucous membranes are dry. Posterior pharynx clear of any exudate or lesions.Normal dentition.  Neck: normal, supple, no masses, no thyromegaly Respiratory: clear to auscultation bilaterally, no wheezing, no crackles. Normal respiratory effort. No accessory muscle use.  Cardiovascular: Regular rate and rhythm, no murmurs / rubs / gallops. No extremity edema. 2+ pedal pulses. No carotid bruits.  Abdomen: no tenderness, no masses palpated. No hepatosplenomegaly. Bowel sounds positive.  Musculoskeletal: Right lower extremity laterally rotated tender, slightly shortened  Skin: no rashes, lesions, ulcers. No induration Neurologic: CN 2-12 grossly intact. Sensation intact, DTR normal. Strength 5/5 in all 4.  Psychiatric: Awake, disoriented in person and place  Data Reviewed:  Temperature 97.5, blood pressure 133/90, pulse 91, hemoglobin 10.5, BUN 35, creatinine 0.72CK of 481 with initial troponin of 48.  Acute viral screen currently negative.  Urinalysis is negative.  Salicylate level is negative.  EKG showed sinus rhythm.  X-ray of the knee left and right showed no acute findings mainly osteoarthritis.  Head CT without contrast showed mixed density left subdural hematoma which appears acute on chronic 7 mm in thickness.  CT chest abdomen pelvis showed intertrochanteric right hip fracture also mild superior endplate compression fracture deformity at L1 with minimal retropulsion the age is indeterminate.  Patient has moderate rectal stool burden probably fecal impaction.  CT of the cervical spine showed no acute findings.  Assessment and Plan:  #1 left subdural hematoma: Most likely traumatic from the fall.  Patient is not likely to be a candidate for any aggressive neurosurgical intervention.  At this point we will admit and avoid all anticoagulants.  Monitor on telemetry bed.  Repeat head CT in 8 hours neurosurgery.   If stable no further action.  If unstable or worsen subdural hematoma we may have to consider palliation.  #2 right intertrochanteric fracture: Closed fracture.  Admit for pain control.  Orthopedics already consulted.  Plan to do surgery probably tomorrow.  Will keep n.p.o. after midnight.  No anticoagulants.  #3 dementia: At baseline.  Resume home regimen  #4 recurrent falls: High risk.  PT OT consultation.  Continue with further care possibly placement.  #5 elevated blood pressure: Does not appear to be on any medication for hypertension.  No prior diagnosis in the chart.  Will monitor blood pressure and if blood pressure is persistently high we will start treatment.  #6 L1 compression fracture: Possibly acute versus chronic.  Patient having significant pain in the back.  Again conservative measures pain management.  #7 osteoarthritis of multiple joints: Continue prior to admission treatment.  #8 GERD: PPIs.    Advance Care Planning:   Code Status: Limited: Do not attempt resuscitation (DNR) -DNR-LIMITED -Do Not Intubate/DNI  Consults: Neurosurgery PA, Johnanna, Orthopedic surgery, Dr Reyne  Family Communication: Gilmer Lenis over the phone  Severity of Illness: The appropriate patient status for this patient is INPATIENT. Inpatient status is judged to be reasonable and necessary in order to provide the required intensity of service to ensure the patient's safety. The patient's presenting symptoms, physical exam findings, and initial radiographic and laboratory data in the context of their chronic comorbidities is felt to place them at high risk for further clinical deterioration. Furthermore, it is not anticipated that the patient will be medically stable for discharge from the hospital within 2 midnights of admission.   * I certify that at the point of admission it is my clinical judgment that the patient will require inpatient hospital care spanning beyond 2 midnights from the point  of admission due to high intensity of service, high risk for further deterioration and high frequency of surveillance required.*  AuthorBETHA SIM KNOLL, MD 06/16/2024 12:49 AM  For on call review [email or web address].     [1] No Known Allergies

## 2024-06-16 NOTE — Progress Notes (Signed)
 Patient ID: Courtney Avila, female   DOB: 08/04/1940, 83 y.o.   MRN: [id number] I have reviewed all pertinent notes, labs, x-rays and information on this patient.  She has a displaced intertrochanteric femur fracture of the right hip and we are recommending surgical intervention to decrease her pain and improve her quality of life.  This has been discussed over the phone with her son and informed consent has been obtained.  The right operative hip has been marked.

## 2024-06-16 NOTE — Consult Note (Signed)
 Orthopedic Consult  Patient ID: Courtney Avila MRN: [id number] DOB/AGE: 85/30/1942 85 y.o.  Reason for Consult: Right hip fracture Referring Physician: Sim  HPI: Courtney Avila is an 83 y.o. female with a history of dementia.  The patient is currently poorly communicative and so the history is obtained from the chart.  According to the records, she resides at home.  She has advanced Alzheimer's type dementia, does have good family support.  Apparently she had had a ground-level fall approximately 2 days ago.  Since that time she has had pelvic pain.  She is a limited ambulator at baseline but was ambulating less than normal.  Given the persistence of the pain, the family brought her in to the hospital.  During evaluation, the patient was found to have a hip fracture prompting our consultation.  She additionally noted to have an L1 compression fracture as well as a subdural hematoma.  She has been evaluated by neurosurgery for this.  Past Medical History:  Diagnosis Date   Allergy    Anxiety    Arthritis    Dementia (HCC)    GERD (gastroesophageal reflux disease)     Past Surgical History:  Procedure Laterality Date   ABDOMINAL HYSTERECTOMY     ANKLE SURGERY     growth lipoma removed   COLONOSCOPY     MULTIPLE TOOTH EXTRACTIONS     REVERSE SHOULDER ARTHROPLASTY Right 04/09/2016   REVERSE SHOULDER ARTHROPLASTY Right 04/09/2016   Procedure: RIGHT REVERSE SHOULDER ARTHROPLASTY;  Surgeon: Franky Pointer, MD;  Location: [facility] OR;  Service: Orthopedics;  Laterality: Right;   TOTAL KNEE ARTHROPLASTY Right 12/07/2016   Procedure: RIGHT TOTAL KNEE ARTHROPLASTY;  Surgeon: Melodi Lerner, MD;  Location: [facility];  Service: Orthopedics;  Laterality: Right;    Family History  Problem Relation Age of Onset   Diabetes Mother    Hypertension Mother    Heart disease Father    Colon cancer Father    Breast cancer Sister     Social History:  reports that she has never smoked. She has never used smokeless  tobacco. She reports that she does not drink alcohol and does not use drugs.  Allergies: Allergies[1]  Medications: I have reviewed the patient's current medications.  ROS: Unable to be obtained   Exam: Blood pressure 121/71, pulse 91, temperature 98.6 F (37 C), temperature source Oral, resp. rate 17, SpO2 96%. General: Patient is resting in bed.  She is poorly communicative Orientation: Not  oriented Mood and Affect: Arousable but sleepy poorly communicative   Right leg is held flexed.  Does appear to be slightly shortened compared to the left side but this is able to assess.  Grossly moving the bilateral lower extremities No obvious pain or deformities about the right ankle or knee.  No tenderness palpation obvious crepitus defects forming to the left lower extremity or the bilateral upper extremities   Medical Decision Making: Data: Imaging: CT scan including the visualized portions of the pelvis reveal an intertrochanteric right hip fracture  Labs: White cell count 9.3, hemoglobin 10.5, hematocrit 33  Imaging or Labs ordered: AP pelvis and x-rays of the right hip  Medical history and chart was reviewed and case discussed with medical provider.  Assessment/Plan: The patient does have a right in trochanteric hip fracture.  We will get x-rays to better visualize this and for surgical planning.  This typically be treated with surgery and likely an intramedullary rod although again exact interpretation we made once x-rays of the  obtained.  I will discuss this further with the patient's family in the morning.  Will also await clearance for surgery from neurosurgery and from medicine.  Will keep her n.p.o. in the meantime.  New problem w/ workup planned: High complexity diagnosis (Level 5) Surgery w/ risks or Emergency surgery: High complexity Risk (Level 5)  All others are Level 4 with comprehensive musculoskeletal exam.  Cordella Rhein, MD, MS [facility]  Specialist / [place] [phone]      [1] No Known Allergies

## 2024-06-16 NOTE — Progress Notes (Signed)
 [facility] PROGRESS NOTE  Patient: Courtney Avila FMW:[id number]   PCP: [name], NP DOB: 11-Nov-1940   DOA: 06/15/2024   DOS: 06/16/2024    Subjective: Patient was crying in pain earlier in the morning.  Later in the day was comfortable at the time of my evaluation.  Denied any acute pain.  Objective:  Vitals:   06/16/24 0357 06/16/24 0611 06/16/24 0954 06/16/24 1238  BP:  (!) 142/86  (!) 123/57  Pulse:  93  74  Resp:  19  13  Temp: 98 F (36.7 C)  98.1 F (36.7 C) 98.4 F (36.9 C)  TempSrc:   Oral Oral  SpO2:  96%  96%   Drowsy.  Oriented to self only.  Able to follow commands.  Oral mucosa is dry.  Pupils are equal and reactive to light.  Bilateral equal strength although generalized weakness. S1-S2 present.  No murmur. Auscultation was with clear breath sounds. Bowel sounds present. No significant edema of lower extremities seen.  Reordered CBC, CMP, CK, troponin. Troponin remains elevated but flat. CK remains elevated but flat as well. Serum creatinine normal.  Electrolytes are normal.  Hemoglobin dropped 1 g likely in the setting of IV fluid.  CT of the head was ordered as recommended by neurosurgery to ED provider. Discussed with neurosurgery after CT was resulted.  Stable SDH.  No further workup recommended no intervention recommended.  Per neurosurgery OKAY TO INITIATE DVT PROPHYLAXIS 24-HOUR AFTER STABLE CT SCAN WHICH IS ON 12/13.  Discussed with orthopedic surgery.  Recommended from medical point of view, no prohibitive risk from the surgery although patient remains with poor prognosis given her dementia and failure to thrive.  Discussed with son with regards to the current recommendation as well.  Assessment and plan: Hip fracture. Patient scheduled for surgery today. Monitor postop recovery. Initiate DVT prophylaxis on 12/13.  Subdural hematoma. Switch from stepdown to progressive unit. Continue every 4 hour  monitoring of neuro  checks.  Dementia. Prognosis poor. Monitor.  Author: Yetta Blanch, MD [facility] 06/16/2024 5:08 PM   If 7PM-7AM, please contact night-coverage at [email or web address]

## 2024-06-16 NOTE — Transfer of Care (Signed)
 Immediate Anesthesia Transfer of Care Note  Patient: Courtney Avila  Procedure(s) Performed: HEMIARTHROPLASTY, HIP, DIRECT ANTERIOR APPROACH, FOR FRACTURE (Right: Hip)  Patient Location: PACU  Anesthesia Type:General  Level of Consciousness: drowsy and patient cooperative  Airway & Oxygen Therapy: Patient Spontanous Breathing and Patient connected to face mask oxygen  Post-op Assessment: Report given to RN and Post -op Vital signs reviewed and stable  Post vital signs: Reviewed and stable  Last Vitals:  Vitals Value Taken Time  BP 127/56 06/16/24 17:34  Temp    Pulse 61 06/16/24 17:36  Resp 11 06/16/24 17:36  SpO2 100 % 06/16/24 17:36  Vitals shown include unfiled device data.  Last Pain:  Vitals:   06/16/24 1238  TempSrc: Oral         Complications: No notable events documented.

## 2024-06-17 ENCOUNTER — Inpatient Hospital Stay (HOSPITAL_COMMUNITY)

## 2024-06-17 DIAGNOSIS — S065XAA Traumatic subdural hemorrhage with loss of consciousness status unknown, initial encounter: Secondary | ICD-10-CM | POA: Diagnosis not present

## 2024-06-17 LAB — CBC
HCT: 18.9 % — ABNORMAL LOW (ref 36.0–46.0)
HCT: 18.3 % — ABNORMAL LOW (ref 36.0–46.0)
Hemoglobin: 6.1 g/dL — CL (ref 12.0–15.0)
Hemoglobin: 5.9 g/dL — CL (ref 12.0–15.0)
MCH: 30.5 pg (ref 26.0–34.0)
MCH: 30.9 pg (ref 26.0–34.0)
MCHC: 32.3 g/dL (ref 30.0–36.0)
MCHC: 32.2 g/dL (ref 30.0–36.0)
MCV: 94.5 fL (ref 80.0–100.0)
MCV: 95.8 fL (ref 80.0–100.0)
Platelets: 355 K/uL (ref 150–400)
Platelets: 341 K/uL (ref 150–400)
RBC: 2 MIL/uL — ABNORMAL LOW (ref 3.87–5.11)
RBC: 1.91 MIL/uL — ABNORMAL LOW (ref 3.87–5.11)
RDW: 13.4 % (ref 11.5–15.5)
RDW: 13.5 % (ref 11.5–15.5)
WBC: 10.4 K/uL (ref 4.0–10.5)
WBC: 10.9 K/uL — ABNORMAL HIGH (ref 4.0–10.5)
nRBC: 0 % (ref 0.0–0.2)
nRBC: 0 % (ref 0.0–0.2)

## 2024-06-17 LAB — ECHOCARDIOGRAM COMPLETE
AR max vel: 1.04 cm2
AV Area VTI: 1.09 cm2
AV Area mean vel: 0.95 cm2
AV Mean grad: 6 mmHg
AV Peak grad: 10.1 mmHg
Ao pk vel: 1.59 m/s
Area-P 1/2: 4.12 cm2
S' Lateral: 2.6 cm

## 2024-06-17 LAB — BASIC METABOLIC PANEL WITH GFR
Anion gap: 8 (ref 5–15)
BUN: 21 mg/dL (ref 8–23)
CO2: 23 mmol/L (ref 22–32)
Calcium: 8.5 mg/dL — ABNORMAL LOW (ref 8.9–10.3)
Chloride: 106 mmol/L (ref 98–111)
Creatinine, Ser: 0.74 mg/dL (ref 0.44–1.00)
GFR, Estimated: >60 mL/min (ref >60)
Glucose, Bld: 150 mg/dL — ABNORMAL HIGH (ref 70–99)
Potassium: 4.5 mmol/L (ref 3.5–5.1)
Sodium: 137 mmol/L (ref 135–145)

## 2024-06-17 LAB — PREPARE RBC (CROSSMATCH)

## 2024-06-17 LAB — MAGNESIUM: Magnesium: 2 mg/dL (ref 1.7–2.4)

## 2024-06-17 MED ORDER — OXYCODONE HCL 5 MG PO TABS
5.0000 mg | ORAL_TABLET | ORAL | Status: DC | PRN
  Administered 2024-06-17 – 2024-06-18 (×3): 5 mg via ORAL
  Filled 2024-06-17 (×3): qty 1

## 2024-06-17 MED ORDER — ACETAMINOPHEN 500 MG PO TABS
1000.0000 mg | ORAL_TABLET | Freq: Three times a day (TID) | ORAL | Status: DC
  Administered 2024-06-17 – 2024-07-15 (×80): 1000 mg via ORAL
  Filled 2024-06-17 (×64): qty 2

## 2024-06-17 MED ORDER — SODIUM CHLORIDE 0.9% IV SOLUTION: Freq: Once | INTRAVENOUS | Status: AC

## 2024-06-17 MED ORDER — DEXTROSE IN LACTATED RINGERS 5 % IV SOLN: INTRAVENOUS | Status: DC

## 2024-06-17 MED ORDER — MORPHINE SULFATE (PF) 2 MG/ML IV SOLN
1.0000 mg | INTRAVENOUS | Status: DC | PRN
  Administered 2024-06-17 – 2024-06-19 (×2): 1 mg via INTRAVENOUS
  Filled 2024-06-17 (×2): qty 1

## 2024-06-17 MED ORDER — TRAMADOL HCL 50 MG PO TABS
50.0000 mg | ORAL_TABLET | Freq: Four times a day (QID) | ORAL | Status: DC | PRN
  Administered 2024-06-17 – 2024-06-22 (×5): 50 mg via ORAL
  Filled 2024-06-17 (×6): qty 1

## 2024-06-17 MED ORDER — MORPHINE SULFATE (PF) 2 MG/ML IV SOLN: 1.0000 mg | INTRAVENOUS | Status: DC | PRN

## 2024-06-17 NOTE — Progress Notes (Signed)
 Echocardiogram 2D Echocardiogram has been performed.  Darreld Hoffer N Kiron Osmun,RDCS 06/17/2024, 10:50 AM

## 2024-06-17 NOTE — Progress Notes (Signed)
 Patient ID: Courtney Avila, female   DOB: 1941/03/09, 83 y.o.   MRN: [id number] The patient is awake and alert but not oriented with obvious dementia which is likely at her baseline.  Her right operative hip dressing is clean and dry.  We do have her in a knee immobilizer secondary to the fact that she contracts her hips and knees and flexes these when she is laying in the bed.  Even her feet and ankle show stiffness and contractures.  When I did speak to family it sounded like she mobilizes minimally but does mobilize some.  The way she looks right now, I have my doubts about what her mobility was like prior to surgery.  I was planning to remove the knee immobilizer, however, I think it is best to leave it on for now up to keep that leg straight given the fact that she likes to flex her hips and knees.  This will likely only be short-term in terms of the knee immobilizer.

## 2024-06-17 NOTE — Plan of Care (Signed)

## 2024-06-17 NOTE — Plan of Care (Signed)

## 2024-06-17 NOTE — Progress Notes (Addendum)
 [facility] Progress Note Patient: Courtney Avila FMW:[id number] DOB: 22-Jun-1941  DOA: 06/15/2024 DOS: the patient was seen and examined on 06/17/2024  Brief Hospital Course: Patient with PMH of dementia, GERD, anxiety, HTN presented to hospital with complaints of an unwitnessed fall. Fall occurred 2 days prior to admission. There are some concern for possible UTI as well. Found to have subdural hematoma as well as right hip fracture. Orthopedic surgery was consulted.  Recommended surgery. Neurosurgery was consulted, recommended conservative measures. Underwent total hip arthroplasty on 12/12.  Assessment and Plan: Subdural hematoma. 2 CT scans 8 hours apart shows stable subdural hematoma. Discussed with neurosurgery, recommend no further workup. Per neurosurgery it is okay to initiate DVT prophylaxis on 12/13. Outpatient follow-up recommended.  Unwitnessed fall. Etiology not clear. With dementia history limited. PT OT consulted. Follow-up recommendation.  Right hip fracture. Underwent total hip arthroplasty on 12/12. Monitor postop recovery. WBAT per orthopedic. Currently holding DVT prophylaxis.  Postop acute blood loss anemia. Expected blood loss 5 cc. Hemoglobin dropped down to 6.1 postoperatively. Given 2 PRBC transfusion. Transfuse for hemoglobin less than than 8. Consent provided by son.  Dementia. Severe. Monitor for now. No evidence of delirium here in the hospital.  Insomnia. On Restoril . Will continue the same for now.  Anxiety and mood disorder. On BuSpar . Continue for now.  Poor p.o. intake. Currently receiving D5 LR. Monitor for now.  DNR.  Concern for a UTI. Urine analysis is unremarkable. Urine culture not performed. Blood cultures were performed. Currently on IV ceftriaxone . Will complete 3-day treatment currently.  Staph epidermidis contamination of blood culture. 1 out of 2 blood cultures positive for Staph epidermidis. Likely  contamination. We will repeat blood culture for now and monitor.  Subjective: Patient denies any acute complaint.  Reports pain well-controlled.  No nausea or vomiting.  Although ROS is limited due to patient's memory issues.  Physical Exam: Clear to auscultation. S1-S2 present. Alert awake and oriented to self only. During my interview patient moans heavily but when I ask her whether she is in pain she denies any pain. Morning does not change upon abdominal palpation. Pupils are equal and regular.  No edema.  Data Reviewed: I have Reviewed nursing notes, Vitals, and Lab results. Since last encounter, pertinent lab results CBC and BMP   . I have ordered test including CBC and BMP  .   Disposition: Status is: Inpatient Remains inpatient appropriate because: Monitor for improvement in postop recovery  SCDs Start: 06/16/24 2152 SCDs Start: 06/16/24 0734   Family Communication: Discussed with son on the phone Level of care: Progressive continue Vitals:   06/17/24 1630 06/17/24 1709 06/17/24 1722 06/17/24 1730  BP: (!) 107/57 (!) 107/57 125/69 125/69  Pulse: 74 74 73 73  Resp:  20    Temp: 98.4 F (36.9 C) 98.4 F (36.9 C) 98.4 F (36.9 C) 98.4 F (36.9 C)  TempSrc: Axillary Axillary Axillary Axillary  SpO2: 100% 100% 100% 100%     Author: Yetta Blanch, MD 06/17/2024 6:27 PM  Please look on [email or web address] to find out who is on call.

## 2024-06-17 NOTE — Evaluation (Signed)
 Physical Therapy Evaluation Patient Details Name: Courtney Avila MRN: [id number] DOB: [birth year] Today's Date: 06/17/2024  History of Present Illness  83 y.o. female was brought by EMS from home with concern for possible UTI by family. Patient apparently has had generalized pain since she had a fall 2 days ago. She is also having pelvic pain that the family thought was possibly UTI. She is normally ambulatory and can communicate despite advanced dementia. She had a fall 2 prior to admission and since that she has been less ambulatory. on Evaluation in ED pt was determined to have a 7 mm subdural hematoma,  right intertrochanteric fracture,  and and L1 compression fracture all presumed to be from the fall. Orthopedics and neurosurgery were both consulted PMH: Alzheimer's dementia, osteoarthritis, GERD, anxiety disorder, hypertension   was brought by EMS from home with concern for possible UTI by family.   Orthopedics and neurosurgery were both consulted. pt is s/p R DA THA  Clinical Impression  Pt admitted with above diagnosis. Pt seen at bed level, mobility limited by pain and cognition. No family present at time of PT eval. Given pt's extremity contractures it does not appear she was overly mobile/ambulatory  prior this admission-unsure of actual functional baseline.  Pt may benefit from Palliative Care consult.  Will follow on a trial basis. Pt has limited rehab potential however may need SNF depending on baseline and family's ability to provide care.    Pt currently with functional limitations due to the deficits listed below (see PT Problem List). Pt will benefit from acute skilled PT to increase their independence and safety with mobility to allow discharge.           If plan is discharge home, recommend the following: Two people to help with bathing/dressing/bathroom;Help with stairs or ramp for entrance;Assist for transportation;Two people to help with walking and/or transfers   Can travel  by private vehicle        Equipment Recommendations Other (comment) (TBD)  Recommendations for Other Services       Functional Status Assessment Patient has had a recent decline in their functional status and demonstrates the ability to make significant improvements in function in a reasonable and predictable amount of time.     Precautions / Restrictions Precautions Precautions: Fall Precaution/Restrictions Comments: KI for now short term per Dr, Vernetta, pt does not have precautions but tends to move into flexion pattern Required Braces or Orthoses: Knee Immobilizer - Right Knee Immobilizer - Right: Other (comment) Restrictions Weight Bearing Restrictions Per Provider Order: No Other Position/Activity Restrictions: WBAT      Mobility  Bed Mobility               General bed mobility comments: deferred/pt unable to d/t pain with any movement    Transfers                        Ambulation/Gait                  Stairs            Wheelchair Mobility     Tilt Bed    Modified Rankin (Stroke Patients Only)       Balance  Pertinent Vitals/Pain Pain Assessment Pain Assessment: Faces Faces Pain Scale: Hurts even more Pain Location: diffuse/LEs--pt grimaces with any movement Pain Descriptors / Indicators: Grimacing, Guarding Pain Intervention(s): Limited activity within patient's tolerance, Monitored during session, Premedicated before session, Repositioned pt was medicated 1 hour prior to session.    Home Living Family/patient expects to be discharged to:: Unsure                        Prior Function Prior Level of Function : Patient poor historian/Family not available             Mobility Comments: pt verbalizes however is unable to give PLOF, given contractures it appears pt was minimally mobile prior to admission       Extremity/Trunk Assessment    Upper Extremity Assessment Upper Extremity Assessment: Defer to OT evaluation    Lower Extremity Assessment Lower Extremity Assessment: RLE deficits/detail;LLE deficits/detail RLE Deficits / Details: KI in place, removed, AAROM knee flexion~ 30 degrees,  hip flexion ~ 30degrees - pt resistant to movement; pf contracture, with time able to passively bring ankle to neutral (difficulty to test d/t cognition) LLE Deficits / Details: knee in flexion,  hip  maintained in flexion and hip adduction, pf contracture. (at one point pt  states  that leg is no good ). pt grimaces with attempts to extend and gently stretch LLE       Communication   Communication Communication: Impaired Factors Affecting Communication: Difficulty expressing self    Cognition Arousal: Alert Behavior During Therapy: Flat affect   PT - Cognitive impairments: History of cognitive impairments                         Following commands: Impaired Following commands impaired: Follows one step commands inconsistently     Cueing Cueing Techniques: Verbal cues, Tactile cues, Gestural cues     General Comments      Exercises     Assessment/Plan    PT Assessment Patient needs continued PT services  PT Problem List Decreased mobility;Decreased activity tolerance       PT Treatment Interventions Therapeutic activities;Functional mobility training;Therapeutic exercise;Patient/family education    PT Goals (Current goals can be found in the Care Plan section)  Acute Rehab PT Goals PT Goal Formulation: Patient unable to participate in goal setting Time For Goal Achievement: 07/01/24 Potential to Achieve Goals: Poor    Frequency Min 2X/week     Co-evaluation               AM-PAC PT 6 Clicks Mobility  Outcome Measure Help needed turning from your back to your side while in a flat bed without using bedrails?: Total Help needed moving from lying on your back to sitting on the side of a flat  bed without using bedrails?: Total Help needed moving to and from a bed to a chair (including a wheelchair)?: Total Help needed standing up from a chair using your arms (e.g., wheelchair or bedside chair)?: Total Help needed to walk in hospital room?: Total Help needed climbing 3-5 steps with a railing? : Total 6 Click Score: 6    End of Session Equipment Utilized During Treatment: Other (comment) (on 3L of O2 during session) Activity Tolerance: Patient limited by pain;Other (comment) (cognition) Patient left: with call bell/phone within reach;with bed alarm set;with nursing/sitter in room   PT Visit Diagnosis: Other abnormalities of gait and mobility (R26.89)    Time: 8873-8859  PT Time Calculation (min) (ACUTE ONLY): 14 min   Charges:   PT Evaluation $PT Eval Low Complexity: 1 Low   PT General Charges $$ ACUTE PT VISIT: 1 Visit         Landri Dorsainvil, PT  Acute Rehab Dept ([facility]) [phone]  06/17/2024   [facility] 06/17/2024, 1:15 PM

## 2024-06-18 LAB — COMPREHENSIVE METABOLIC PANEL WITH GFR
ALT: 23 U/L (ref 0–44)
AST: 95 U/L — ABNORMAL HIGH (ref 15–41)
Albumin: 2.7 g/dL — ABNORMAL LOW (ref 3.5–5.0)
Alkaline Phosphatase: 69 U/L (ref 38–126)
Anion gap: 6 (ref 5–15)
BUN: 19 mg/dL (ref 8–23)
CO2: 26 mmol/L (ref 22–32)
Calcium: 8.3 mg/dL — ABNORMAL LOW (ref 8.9–10.3)
Chloride: 107 mmol/L (ref 98–111)
Creatinine, Ser: 0.63 mg/dL (ref 0.44–1.00)
GFR, Estimated: >60 mL/min (ref >60)
Glucose, Bld: 147 mg/dL — ABNORMAL HIGH (ref 70–99)
Potassium: 4 mmol/L (ref 3.5–5.1)
Sodium: 139 mmol/L (ref 135–145)
Total Bilirubin: 0.5 mg/dL (ref 0.0–1.2)
Total Protein: 5 g/dL — ABNORMAL LOW (ref 6.5–8.1)

## 2024-06-18 LAB — CBC WITH DIFFERENTIAL/PLATELET
Abs Immature Granulocytes: 0.06 K/uL (ref 0.00–0.07)
Basophils Absolute: 0 K/uL (ref 0.0–0.1)
Basophils Relative: 0 %
Eosinophils Absolute: 0.1 K/uL (ref 0.0–0.5)
Eosinophils Relative: 1 %
HCT: 27.3 % — ABNORMAL LOW (ref 36.0–46.0)
Hemoglobin: 9.2 g/dL — ABNORMAL LOW (ref 12.0–15.0)
Immature Granulocytes: 1 %
Lymphocytes Relative: 14 %
Lymphs Abs: 1.2 K/uL (ref 0.7–4.0)
MCH: 31.3 pg (ref 26.0–34.0)
MCHC: 33.7 g/dL (ref 30.0–36.0)
MCV: 92.9 fL (ref 80.0–100.0)
Monocytes Absolute: 1.1 K/uL — ABNORMAL HIGH (ref 0.1–1.0)
Monocytes Relative: 12 %
Neutro Abs: 6.4 K/uL (ref 1.7–7.7)
Neutrophils Relative %: 72 %
Platelets: 298 K/uL (ref 150–400)
RBC: 2.94 MIL/uL — ABNORMAL LOW (ref 3.87–5.11)
RDW: 13.8 % (ref 11.5–15.5)
WBC: 8.8 K/uL (ref 4.0–10.5)
nRBC: 0 % (ref 0.0–0.2)

## 2024-06-18 LAB — CULTURE, BLOOD (ROUTINE X 2): Special Requests: ADEQUATE

## 2024-06-18 LAB — MAGNESIUM: Magnesium: 2.1 mg/dL (ref 1.7–2.4)

## 2024-06-18 NOTE — Progress Notes (Signed)
 [facility] PROGRESS NOTE    Progress Note  Courtney Avila  FMW:[id number] DOB: 1940/11/14 DOA: 06/15/2024 PCP: [name], NP     Brief Narrative:   Courtney Avila is an 83 y.o. 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  Neurosurgery was consulted recommended conservative management.   Assessment/Plan:   Subdural hematoma (HCC) 2 CT scan 8 hours apart showed stable subdural hematoma. Discussed with neurosurgery recommended no further treatment they recommended to initiate DVT prophylaxis for hip surgery on 06/17/2024. Follow-up with them as an outpatient.  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 Currently on DVT prophylaxis. Narcotics and anticoagulation per orthopedic doctor. Orthopedic surgery recommended to keep knee immobilizer on to keep the leg straight short-term.  Unwitnessed fall: Etiology unclear with a history of dementia history is limited. Evaluated by PT will need skilled nursing facility placement.  Postop acute blood loss anemia: Hemoglobin postoperatively dropped to 6.1. Transfused 2 units packed red blood cells. Hemoglobin this morning is 9.2.  Advanced dementia: Severe noted.  Insomnia: Continue Restoril .  Anxiety and mood disorder: Continue BuSpar .  Oral intake: On D5.  Goals of care: She is currently DNR/DNI.  Concern for UTI: UA was unremarkable. Blood cultures remain negative till date she completed a 3-day course of IV antibiotics in house.  1/2 blood cultures positive for staph epi: Likely contaminant.   DVT prophylaxis: scd Family Communication:none Status is: Inpatient Remains inpatient  appropriate because: Right hip fracture    Code Status:     Code Status Orders  (From admission, onward)           Start     Ordered   06/16/24 0049  Do not attempt resuscitation (DNR)- Limited -Do Not Intubate (DNI)  Continuous       Question Answer Comment  If pulseless and not breathing No CPR or chest compressions.   In Pre-Arrest Conditions (Patient Is Breathing and Has A Pulse) Do not intubate. Provide all appropriate non-invasive medical interventions. Avoid ICU transfer unless indicated or required.   Consent: Discussion documented in EHR or advanced directives reviewed      06/16/24 0049           Code Status History     Date Active Date Inactive Code Status Order ID Comments User Context   01/24/2024 1529 06/15/2024 1835 DNR [id number]  [name], NP Outpatient   12/07/2016 1636 12/09/2016 1730 Full Code [id number]  Melodi Lerner, MD Inpatient   04/09/2016 1215 04/10/2016 2031 Full Code [id number]  Garland Sora, PA-C Inpatient         IV Access:   Peripheral IV   Procedures and diagnostic studies:   ECHOCARDIOGRAM COMPLETE Result Date: 06/17/2024    ECHOCARDIOGRAM REPORT   Patient Name:   Courtney Avila Date of Exam: 06/17/2024 Medical Rec #:  [id number]     Height:       59.0 in Accession #:    [id number]    Weight:       99.8 lb Date of Birth:  December 22, 1940     BSA:          1.373 m Patient Age:    83 years  BP:           120/57 mmHg Patient Gender: F             HR:           72 bpm. Exam Location:  Inpatient Procedure: 2D Echo, Cardiac Doppler and Color Doppler (Both Spectral and Color            Flow Doppler were utilized during procedure). Indications:    Preoperaive evaluation  History:        Patient has no prior history of Echocardiogram examinations.                 Risk Factors:Hypertension.  Sonographer:    Logan Shove RDCS Referring Phys: 35 [facility]STOP[facility] Y [facility] IMPRESSIONS  1. Limited study; no subcostal views obtained.  2. Left ventricular  ejection fraction, by estimation, is 60 to 65%. The left ventricle has normal function. The left ventricle has no regional wall motion abnormalities. Left ventricular diastolic parameters are consistent with Grade I diastolic dysfunction (impaired relaxation).  3. Right ventricular systolic function is normal. The right ventricular size is normal.  4. The mitral valve is normal in structure. Trivial mitral valve regurgitation. No evidence of mitral stenosis.  5. The aortic valve has an indeterminant number of cusps. Aortic valve regurgitation is trivial. No aortic stenosis is present. Comparison(s): No prior Echocardiogram. FINDINGS  Left Ventricle: Left ventricular ejection fraction, by estimation, is 60 to 65%. The left ventricle has normal function. The left ventricle has no regional wall motion abnormalities. The left ventricular internal cavity size was normal in size. There is  no left ventricular hypertrophy. Left ventricular diastolic parameters are consistent with Grade I diastolic dysfunction (impaired relaxation). Right Ventricle: The right ventricular size is normal. Right ventricular systolic function is normal. Left Atrium: Left atrial size was normal in size. Right Atrium: Right atrial size was normal in size. Pericardium: There is no evidence of pericardial effusion. Mitral Valve: The mitral valve is normal in structure. Trivial mitral valve regurgitation. No evidence of mitral valve stenosis. Tricuspid Valve: The tricuspid valve is normal in structure. Tricuspid valve regurgitation is trivial. No evidence of tricuspid stenosis. Aortic Valve: The aortic valve has an indeterminant number of cusps. Aortic valve regurgitation is trivial. No aortic stenosis is present. Aortic valve mean gradient measures 6.0 mmHg. Aortic valve peak gradient measures 10.1 mmHg. Pulmonic Valve: The pulmonic valve was not well visualized. Pulmonic valve regurgitation is not visualized. No evidence of pulmonic stenosis.  Aorta: The aortic root is normal in size and structure. Venous: The inferior vena cava was not well visualized. IAS/Shunts: The interatrial septum was not well visualized. Additional Comments: Limited study; no subcostal views obtained.  LEFT VENTRICLE PLAX 2D LVIDd:         4.60 cm   Diastology LVIDs:         2.60 cm   LV e' medial:    7.07 cm/s LV PW:         0.90 cm   LV E/e' medial:  11.1 LV IVS:        0.60 cm   LV e' lateral:   7.96 cm/s LVOT diam:     1.70 cm   LV E/e' lateral: 9.9 LV SV:         43 LV SV Index:   31 LVOT Area:     2.27 cm  RIGHT VENTRICLE RV Basal diam:  3.50 cm RV S prime:     10.70 cm/s  TAPSE (M-mode): 2.3 cm LEFT ATRIUM             Index        RIGHT ATRIUM           Index LA diam:        3.30 cm 2.40 cm/m   RA Area:     15.40 cm LA Vol (A2C):   47.6 ml 34.68 ml/m  RA Volume:   48.00 ml  34.97 ml/m LA Vol (A4C):   37.9 ml 27.61 ml/m LA Biplane Vol: 44.4 ml 32.35 ml/m  AORTIC VALVE AV Area (Vmax):    1.04 cm AV Area (Vmean):   0.95 cm AV Area (VTI):     1.09 cm AV Vmax:           159.00 cm/s AV Vmean:          118.000 cm/s AV VTI:            0.393 m AV Peak Grad:      10.1 mmHg AV Mean Grad:      6.0 mmHg LVOT Vmax:         73.20 cm/s LVOT Vmean:        49.500 cm/s LVOT VTI:          0.189 m LVOT/AV VTI ratio: 0.48 MITRAL VALVE MV Area (PHT): 4.12 cm     SHUNTS MV Decel Time: 184 msec     Systemic VTI:  0.19 m MV E velocity: 78.50 cm/s   Systemic Diam: 1.70 cm MV A velocity: 108.00 cm/s MV E/A ratio:  0.73 Redell Shallow MD Electronically signed by Redell Shallow MD Signature Date/Time: 06/17/2024/12:30:59 PM    Final    DG HIP UNILAT WITH PELVIS 1V RIGHT Result Date: 06/16/2024 CLINICAL DATA:  Right hip replacement EXAM: DG HIP (WITH OR WITHOUT PELVIS) 1V*R* COMPARISON:  06/16/2024 FINDINGS: Three low resolution intraoperative spot views of the right hip. Total fluoroscopy time was 33 seconds, fluoroscopic dose of 3.7891 mGy. The images demonstrate a right hip replacement  with anatomic alignment IMPRESSION: Intraoperative fluoroscopic assistance provided during right hip replacement. Electronically Signed   By: Luke Bun M.D.   On: 06/16/2024 21:14   DG Pelvis Portable Result Date: 06/16/2024 CLINICAL DATA:  Post hip replacement EXAM: DG PORTABLE PELVIS COMPARISON:  06/16/2024 FINDINGS: Interval right hip replacement with intact hardware and normal alignment. Gas in the soft tissues and cutaneous staples. Pubic symphysis appears intact. Moderate retained feces at the rectum IMPRESSION: Status post right hip replacement with expected postsurgical change. Electronically Signed   By: Luke Bun M.D.   On: 06/16/2024 21:13   DG HIP UNILAT WITH PELVIS 2-3 VIEWS RIGHT Result Date: 06/16/2024 CLINICAL DATA:  Elective surgery EXAM: DG HIP (WITH OR WITHOUT PELVIS) 2-3V RIGHT COMPARISON:  06/16/2024 FINDINGS: Three low resolution intraoperative spot views of the right hip. Total fluoroscopy time was 8 seconds, fluoroscopic dose of 1.7 mGy. The images demonstrate a right intertrochanteric fracture with displacement and angulation. IMPRESSION: Intraoperative fluoroscopic assistance provided during attempted right hip surgery. Electronically Signed   By: Luke Bun M.D.   On: 06/16/2024 21:13   DG C-Arm 1-60 Min-No Report Result Date: 06/16/2024 Fluoroscopy was utilized by the requesting physician.  No radiographic interpretation.   DG C-Arm 1-60 Min-No Report Result Date: 06/16/2024 Fluoroscopy was utilized by the requesting physician.  No radiographic interpretation.   DG C-Arm 1-60 Min-No Report Result Date: 06/16/2024 Fluoroscopy was utilized by the requesting physician.  No radiographic interpretation.  DG C-Arm 1-60 Min-No Report Result Date: 06/16/2024 Fluoroscopy was utilized by the requesting physician.  No radiographic interpretation.     Medical Consultants:   None.   Subjective:    Courtney Avila no complaints.  Objective:    Vitals:    06/17/24 2017 06/17/24 2032 06/17/24 2343 06/18/24 0501  BP: (!) 106/59 (!) 114/51 122/60 132/61  Pulse: 72 76 70 64  Resp: (!) 24 (!) 24  16  Temp: (!) 97.5 F (36.4 C) 97.9 F (36.6 C) 97.7 F (36.5 C) 97.9 F (36.6 C)  TempSrc: Axillary Axillary Axillary Oral  SpO2:  100% 100% 100%   SpO2: 100 % O2 Flow Rate (L/min): 3 L/min   Intake/Output Summary (Last 24 hours) at 06/18/2024 1107 Last data filed at 06/18/2024 9386 Gross per 24 hour  Intake 3114.52 ml  Output 800 ml  Net 2314.52 ml   There were no vitals filed for this visit.  Exam: General exam: In no acute distress. Respiratory system: Good air movement and clear to auscultation. Cardiovascular system: S1 & S2 heard, RRR. No JVD. Gastrointestinal system: Abdomen is nondistended, soft and nontender.  Extremities: No pedal edema. Skin: No rashes, lesions or ulcers Psychiatry: No judgment or insight of medical condition.   Data Reviewed:    Labs: Basic Metabolic Panel: Recent Labs  Lab 06/15/24 2214 06/16/24 0918 06/16/24 2258 06/17/24 1318 06/18/24 0236  NA 138 139 137 137 139  K 4.6 4.0 4.8 4.5 4.0  CL 104 106 104 106 107  CO2 24 24 25 23 26   GLUCOSE 91 113* 158* 150* 147*  BUN 35* 22 20 21 19   CREATININE 0.72 0.58 0.67 0.74 0.63  CALCIUM 9.0 8.6* 8.4* 8.5* 8.3*  MG  --   --   --  2.0 2.1   GFR CrCl cannot be calculated (Unknown ideal weight.). Liver Function Tests: Recent Labs  Lab 06/15/24 2214 06/16/24 0918 06/16/24 2258 06/18/24 0236  AST 50* 50* 46* 95*  ALT 24 23 22 23   ALKPHOS 94 86 68 69  BILITOT 0.4 0.5 0.4 0.5  PROT 6.3* 5.9* 5.3* 5.0*  ALBUMIN  3.2* 3.0* 3.1* 2.7*   Recent Labs  Lab 06/15/24 2214  LIPASE 74*   No results for input(s): AMMONIA in the last 168 hours. Coagulation profile Recent Labs  Lab 06/16/24 0918  INR 1.1   COVID-19 Labs  No results for input(s): DDIMER, FERRITIN, LDH, CRP in the last 72 hours.  Lab Results  Component Value Date    SARSCOV2NAA NEGATIVE 06/15/2024   SARSCOV2NAA NEGATIVE 08/04/2022    CBC: Recent Labs  Lab 06/15/24 1942 06/16/24 0918 06/17/24 1318 06/17/24 1539 06/18/24 0236  WBC 9.3 7.1 10.4 10.9* 8.8  NEUTROABS 7.5 5.2  --   --  6.4  HGB 10.5* 9.1* 6.1* 5.9* 9.2*  HCT 33.0* 28.2* 18.9* 18.3* 27.3*  MCV 95.7 94.9 94.5 95.8 92.9  PLT 320 335 355 341 298   Cardiac Enzymes: Recent Labs  Lab 06/15/24 2208 06/16/24 0918  CKTOTAL 481* 461*   BNP (last 3 results) No results for input(s): PROBNP in the last 8760 hours. CBG: No results for input(s): GLUCAP in the last 168 hours. D-Dimer: No results for input(s): DDIMER in the last 72 hours. Hgb A1c: No results for input(s): HGBA1C in the last 72 hours. Lipid Profile: No results for input(s): CHOL, HDL, LDLCALC, TRIG, CHOLHDL, LDLDIRECT in the last 72 hours. Thyroid  function studies: No results for input(s): TSH, T4TOTAL, T3FREE, THYROIDAB in the  last 72 hours.  Invalid input(s): FREET3 Anemia work up: No results for input(s): VITAMINB12, FOLATE, FERRITIN, TIBC, IRON, RETICCTPCT in the last 72 hours. Sepsis Labs: Recent Labs  Lab 06/15/24 2018 06/16/24 0918 06/17/24 1318 06/17/24 1539 06/18/24 0236  WBC  --  7.1 10.4 10.9* 8.8  LATICACIDVEN 1.5  --   --   --   --    Microbiology Recent Results (from the past 240 hours)  Blood culture (routine x 2)     Status: None (Preliminary result)   Collection Time: 06/15/24  8:10 PM   Specimen: BLOOD  Result Value Ref Range Status   Specimen Description   Final    BLOOD RIGHT ANTECUBITAL Performed at [facility], [address] [place], [place] [address]    Special Requests   Final    BOTTLES DRAWN AEROBIC AND ANAEROBIC Blood Culture adequate volume Performed at [facility], [address] [place], [place] [address]    Culture  Setup Time   Final    GRAM POSITIVE COCCI IN CLUSTERS IN BOTH AEROBIC  AND ANAEROBIC BOTTLES CRITICAL RESULT CALLED TO, READ BACK BY AND VERIFIED WITH: PHARMD SYDNEY DAVIS [id number] AT 2050 BY EC    Culture   Final    GRAM POSITIVE COCCI CULTURE REINCUBATED FOR BETTER GROWTH Performed at [facility], [address], [place], [place] [address]    Report Status PENDING  Incomplete  Blood Culture ID Panel (Reflexed)     Status: Abnormal   Collection Time: 06/15/24  8:10 PM  Result Value Ref Range Status   Enterococcus faecalis NOT DETECTED NOT DETECTED Final   Enterococcus Faecium NOT DETECTED NOT DETECTED Final   Listeria monocytogenes NOT DETECTED NOT DETECTED Final   Staphylococcus species DETECTED (A) NOT DETECTED Final    Comment: CRITICAL RESULT CALLED TO, READ BACK BY AND VERIFIED WITH: PHARMD SYDNEY DAVIS [id number] AT 2050 BY EC    Staphylococcus aureus (BCID) NOT DETECTED NOT DETECTED Final   Staphylococcus epidermidis DETECTED (A) NOT DETECTED Final    Comment: Methicillin (oxacillin) resistant coagulase negative staphylococcus. Possible blood culture contaminant (unless isolated from more than one blood culture draw or clinical case suggests pathogenicity). No antibiotic treatment is indicated for blood  culture contaminants. REPEAT WITH NEW VIAL OF CTRL PHARMD SYDNEY DAVIS [id number] AT 2050 BY EC    Staphylococcus lugdunensis NOT DETECTED NOT DETECTED Final   Streptococcus species NOT DETECTED NOT DETECTED Final   Streptococcus agalactiae NOT DETECTED NOT DETECTED Final   Streptococcus pneumoniae NOT DETECTED NOT DETECTED Final   Streptococcus pyogenes NOT DETECTED NOT DETECTED Final   A.calcoaceticus-baumannii NOT DETECTED NOT DETECTED Final   Bacteroides fragilis NOT DETECTED NOT DETECTED Final   Enterobacterales NOT DETECTED NOT DETECTED Final   Enterobacter cloacae complex NOT DETECTED NOT DETECTED Final   Escherichia coli NOT DETECTED NOT DETECTED Final   Klebsiella aerogenes NOT DETECTED NOT DETECTED Final   Klebsiella oxytoca NOT  DETECTED NOT DETECTED Final   Klebsiella pneumoniae NOT DETECTED NOT DETECTED Final   Proteus species NOT DETECTED NOT DETECTED Final   Salmonella species NOT DETECTED NOT DETECTED Final   Serratia marcescens NOT DETECTED NOT DETECTED Final   Haemophilus influenzae NOT DETECTED NOT DETECTED Final   Neisseria meningitidis NOT DETECTED NOT DETECTED Final   Pseudomonas aeruginosa NOT DETECTED NOT DETECTED Final   Stenotrophomonas maltophilia NOT DETECTED NOT DETECTED Final   Candida albicans NOT DETECTED NOT DETECTED Final   Candida auris NOT DETECTED  NOT DETECTED Final   Candida glabrata NOT DETECTED NOT DETECTED Final   Candida krusei NOT DETECTED NOT DETECTED Final   Candida parapsilosis NOT DETECTED NOT DETECTED Final   Candida tropicalis NOT DETECTED NOT DETECTED Final   Cryptococcus neoformans/gattii NOT DETECTED NOT DETECTED Final   Methicillin resistance mecA/C DETECTED (A) NOT DETECTED Final    Comment: CRITICAL RESULT CALLED TO, READ BACK BY AND VERIFIED WITH: PHARMD SYDNEY DAVIS [id number] AT 2050 BY EC Performed at [facility], [address], [place], [place] [address]   Resp panel by RT-PCR (RSV, Flu A&B, Covid) Anterior Nasal Swab     Status: None   Collection Time: 06/15/24 10:13 PM   Specimen: Anterior Nasal Swab  Result Value Ref Range Status   SARS Coronavirus 2 by RT PCR NEGATIVE NEGATIVE Final    Comment: (NOTE) SARS-CoV-2 target nucleic acids are NOT DETECTED.  The SARS-CoV-2 RNA is generally detectable in upper respiratory specimens during the acute phase of infection. The lowest concentration of SARS-CoV-2 viral copies this assay can detect is 138 copies/mL. A negative result does not preclude SARS-Cov-2 infection and should not be used as the sole basis for treatment or other patient management decisions. A negative result may occur with  improper specimen collection/handling, submission of specimen other than nasopharyngeal swab, presence of viral  mutation(s) within the areas targeted by this assay, and inadequate number of viral copies(<138 copies/mL). A negative result must be combined with clinical observations, patient history, and epidemiological information. The expected result is Negative.  Fact Sheet for Patients:  bloggercourse.com  Fact Sheet for Healthcare Providers:  seriousbroker.it  This test is no t yet approved or cleared by the [place]  FDA and  has been authorized for detection and/or diagnosis of SARS-CoV-2 by FDA under an Emergency Use Authorization (EUA). This EUA will remain  in effect (meaning this test can be used) for the duration of the COVID-19 declaration under Section 564(b)(1) of the Act, 21 U.S.C.section 360bbb-3(b)(1), unless the authorization is terminated  or revoked sooner.       Influenza A by PCR NEGATIVE NEGATIVE Final   Influenza B by PCR NEGATIVE NEGATIVE Final    Comment: (NOTE) The Xpert Xpress SARS-CoV-2/FLU/RSV plus assay is intended as an aid in the diagnosis of influenza from Nasopharyngeal swab specimens and should not be used as a sole basis for treatment. Nasal washings and aspirates are unacceptable for Xpert Xpress SARS-CoV-2/FLU/RSV testing.  Fact Sheet for Patients: bloggercourse.com  Fact Sheet for Healthcare Providers: seriousbroker.it  This test is not yet approved or cleared by the [place]  FDA and has been authorized for detection and/or diagnosis of SARS-CoV-2 by FDA under an Emergency Use Authorization (EUA). This EUA will remain in effect (meaning this test can be used) for the duration of the COVID-19 declaration under Section 564(b)(1) of the Act, 21 U.S.C. section 360bbb-3(b)(1), unless the authorization is terminated or revoked.     Resp Syncytial Virus by PCR NEGATIVE NEGATIVE Final    Comment: (NOTE) Fact Sheet for  Patients: bloggercourse.com  Fact Sheet for Healthcare Providers: seriousbroker.it  This test is not yet approved or cleared by the [place]  FDA and has been authorized for detection and/or diagnosis of SARS-CoV-2 by FDA under an Emergency Use Authorization (EUA). This EUA will remain in effect (meaning this test can be used) for the duration of the COVID-19 declaration under Section 564(b)(1) of the Act, 21 U.S.C. section 360bbb-3(b)(1), unless the authorization is terminated or revoked.  Performed at [facility], [address], [place], [place] [address]   Culture, blood (Routine X 2) w Reflex to ID Panel     Status: None (Preliminary result)   Collection Time: 06/17/24  1:18 PM   Specimen: BLOOD  Result Value Ref Range Status   Specimen Description   Final    BLOOD SITE NOT SPECIFIED Performed at [facility], [address], [place], [place] [address]    Special Requests   Final    BOTTLES DRAWN AEROBIC ONLY Blood Culture results may not be optimal due to an inadequate volume of blood received in culture bottles Performed at [facility], [address], [place], [place] [address]    Culture   Final    NO GROWTH < 24 HOURS Performed at [facility], [address] [place], [place] [address]    Report Status PENDING  Incomplete  Culture, blood (Routine X 2) w Reflex to ID Panel     Status: None (Preliminary result)   Collection Time: 06/17/24  1:24 PM   Specimen: BLOOD  Result Value Ref Range Status   Specimen Description   Final    BLOOD SITE NOT SPECIFIED Performed at [facility], [address], [place], [place] [address]    Special Requests   Final    BOTTLES DRAWN AEROBIC ONLY Blood Culture adequate volume Performed at [facility], [address] [place], [place] [address]    Culture   Final    NO GROWTH < 24 HOURS Performed at  [facility], [address], [place], [place] [address]    Report Status PENDING  Incomplete     Medications:    acetaminophen   1,000 mg Oral Q8H   busPIRone   10 mg Oral BID   docusate sodium   100 mg Oral BID   PARoxetine   40 mg Oral QHS   temazepam   30 mg Oral QHS   Continuous Infusions:  cefTRIAXone  (ROCEPHIN )  IV Stopped (06/17/24 1121)   dextrose  5% lactated ringers  100 mL/hr at 06/17/24 2350      LOS: 2 days   Courtney Avila  [facility]  06/18/2024, 11:07 AM

## 2024-06-19 ENCOUNTER — Other Ambulatory Visit: Payer: Self-pay

## 2024-06-19 ENCOUNTER — Encounter (HOSPITAL_COMMUNITY): Payer: Self-pay | Admitting: Orthopaedic Surgery

## 2024-06-19 LAB — BPAM RBC
Blood Product Expiration Date: 202601012359
Blood Product Expiration Date: 202601012359
ISSUE DATE / TIME: 202512131656
ISSUE DATE / TIME: 202512132005
Unit Type and Rh: 9500
Unit Type and Rh: 9500

## 2024-06-19 LAB — CBC
HCT: 28 % — ABNORMAL LOW (ref 36.0–46.0)
Hemoglobin: 9.4 g/dL — ABNORMAL LOW (ref 12.0–15.0)
MCH: 31.1 pg (ref 26.0–34.0)
MCHC: 33.6 g/dL (ref 30.0–36.0)
MCV: 92.7 fL (ref 80.0–100.0)
Platelets: 388 K/uL (ref 150–400)
RBC: 3.02 MIL/uL — ABNORMAL LOW (ref 3.87–5.11)
RDW: 13.8 % (ref 11.5–15.5)
WBC: 9.9 K/uL (ref 4.0–10.5)
nRBC: 0 % (ref 0.0–0.2)

## 2024-06-19 LAB — TYPE AND SCREEN
ABO/RH(D): O NEG
Antibody Screen: NEGATIVE
Unit division: 0
Unit division: 0

## 2024-06-19 MED ORDER — MORPHINE SULFATE (PF) 4 MG/ML IV SOLN
4.0000 mg | INTRAVENOUS | Status: DC | PRN
  Administered 2024-06-19 – 2024-06-21 (×5): 4 mg via INTRAVENOUS
  Filled 2024-06-19 (×8): qty 1

## 2024-06-19 MED ORDER — TEMAZEPAM 30 MG PO CAPS: 30.0000 mg | ORAL_CAPSULE | Freq: Every day | ORAL | 0 refills | Status: AC

## 2024-06-19 MED ORDER — ASPIRIN 81 MG PO CHEW: 81.0000 mg | CHEWABLE_TABLET | Freq: Two times a day (BID) | ORAL | 0 refills | Status: AC

## 2024-06-19 MED ORDER — ASPIRIN 81 MG PO CHEW
81.0000 mg | CHEWABLE_TABLET | Freq: Two times a day (BID) | ORAL | Status: DC
  Administered 2024-06-19 – 2024-07-15 (×53): 81 mg via ORAL
  Filled 2024-06-19 (×39): qty 1

## 2024-06-19 MED ORDER — TRAMADOL HCL 50 MG PO TABS: 50.0000 mg | ORAL_TABLET | Freq: Four times a day (QID) | ORAL | 0 refills | Status: DC | PRN

## 2024-06-19 MED ORDER — SULFAMETHOXAZOLE-TRIMETHOPRIM 800-160 MG PO TABS
1.0000 | ORAL_TABLET | Freq: Two times a day (BID) | ORAL | Status: DC
  Administered 2024-06-19 – 2024-06-21 (×5): 1 via ORAL
  Filled 2024-06-19 (×5): qty 1

## 2024-06-19 MED ORDER — SULFAMETHOXAZOLE-TRIMETHOPRIM 800-160 MG PO TABS: 1.0000 | ORAL_TABLET | Freq: Two times a day (BID) | ORAL | 0 refills | Status: DC

## 2024-06-19 NOTE — Consult Note (Signed)
 WOC Nurse Consult Note: Reason for Consult: R buttocks deep tissue pressure injury  Wound type: 1.  Deep tissue Pressure injury R sacrum/buttock purple maroon discoloration with some lifting of epidermis 2.  Stage 1 Pressure Injury R heel  Pressure Injury POA: Yes Measurement: see nursing flowsheet  Wound bed: as above  Drainage (amount, consistency, odor) appear dry  Periwound: erythema  Dressing procedure/placement/frequency:  Cleanse sacrum/R buttock with soap and water , dry and apply Xeroform gauze ([place] [phone]) to wound bed daily. Secure with silicone foam.  Lift foam daily to replace Xeroform, change foam q3 days and prn soiling.  Cleanse R heel with soap and water , dry and apply silicone foam heel protectors. Place foot in Prevalon boot to offload pressure. Gerlean [phone] POC discussed with bedside nurse. WOC team will not follow. Reconsult if further needs arise. Deep Tissue pressure injuries are high risk to deteriorate, please reconsult if develops necrotic tissue/acute changes to wound bed.   Thank you,    Powell Bar MSN, RN-BC, [employer]

## 2024-06-19 NOTE — Progress Notes (Signed)
 [facility] PROGRESS NOTE    Progress Note  Courtney Avila  FMW:[id number] DOB: [birth year] DOA: 06/15/2024 PCP: [name], NP     Brief Narrative:   Courtney Avila is an 83 y.o. 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  Neurosurgery was consulted recommended conservative management.   Assessment/Plan:   Subdural hematoma (HCC) 2 CT scan 8 hours apart showed stable subdural hematoma. Discussed with neurosurgery recommended no further treatment they recommended to initiate DVT prophylaxis for hip surgery on 06/17/2024. Follow-up with them as an outpatient. Anticipate will need skilled nursing facility placement  Right hip fracture: Underwent total hip arthroplasty on 06/16/2024. Narcotics and DVT prophylaxis per orthopedic doctor. Orthopedic surgery recommended to keep knee immobilizer on to keep the leg straight short-term. DVT prophylaxis for outpatient per orthopedic surgery, narcotics per orthopedic surgery  Unwitnessed fall: Etiology unclear with a history of dementia history is limited. Evaluated by PT will need skilled nursing facility placement.  Postop acute blood loss anemia: Hemoglobin postoperatively dropped to 6.1. Transfused 2 units packed red blood cells. Hemoglobin this morning is 9.2.  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 Has been calm throughout her hospital stay no agitation.   Insomnia: Continue Restoril .  Anxiety and mood disorder: Continue BuSpar .  Oral intake: On D5.  Goals of care: She is currently DNR/DNI.  Concern for UTI: UA was unremarkable. Blood cultures remain negative till date she completed a  3-day course of IV antibiotics in house.  1/2 blood cultures positive for staph epi: Likely contaminant.   DVT prophylaxis: scd Family Communication:none Status is: Inpatient Remains inpatient appropriate because: Right hip fracture    Code Status:     Code Status Orders  (From admission, onward)           Start     Ordered   06/16/24 0049  Do not attempt resuscitation (DNR)- Limited -Do Not Intubate (DNI)  Continuous       Question Answer Comment  If pulseless and not breathing No CPR or chest compressions.   In Pre-Arrest Conditions (Patient Is Breathing and Has A Pulse) Do not intubate. Provide all appropriate non-invasive medical interventions. Avoid ICU transfer unless indicated or required.   Consent: Discussion documented in EHR or advanced directives reviewed      06/16/24 0049           Code Status History     Date Active Date Inactive Code Status Order ID Comments User Context   01/24/2024 1529 06/15/2024 1835 DNR [id number]  [name], NP Outpatient   12/07/2016 1636 12/09/2016 1730 Full Code [id number]  Melodi Lerner, MD Inpatient   04/09/2016 1215 04/10/2016 2031 Full Code [id number]  Garland Sora, PA-C Inpatient         IV Access:   Peripheral IV   Procedures and diagnostic studies:   ECHOCARDIOGRAM COMPLETE Result Date: 06/17/2024    ECHOCARDIOGRAM REPORT   Patient Name:   Courtney Avila Date of Exam: 06/17/2024 Medical Rec #:  [id number]     Height:       59.0 in Accession #:    [id number]    Weight:       99.8 lb Date of Birth:  19-Oct-1940  BSA:          1.373 m Patient Age:    83 years      BP:           120/57 mmHg Patient Gender: F             HR:           72 bpm. Exam Location:  Inpatient Procedure: 2D Echo, Cardiac Doppler and Color Doppler (Both Spectral and Color            Flow Doppler were utilized during procedure). Indications:    Preoperaive evaluation  History:        Patient has no prior history of Echocardiogram  examinations.                 Risk Factors:Hypertension.  Sonographer:    Logan Shove RDCS Referring Phys: 101 CHRISTOPHER Y [facility] IMPRESSIONS  1. Limited study; no subcostal views obtained.  2. Left ventricular ejection fraction, by estimation, is 60 to 65%. The left ventricle has normal function. The left ventricle has no regional wall motion abnormalities. Left ventricular diastolic parameters are consistent with Grade I diastolic dysfunction (impaired relaxation).  3. Right ventricular systolic function is normal. The right ventricular size is normal.  4. The mitral valve is normal in structure. Trivial mitral valve regurgitation. No evidence of mitral stenosis.  5. The aortic valve has an indeterminant number of cusps. Aortic valve regurgitation is trivial. No aortic stenosis is present. Comparison(s): No prior Echocardiogram. FINDINGS  Left Ventricle: Left ventricular ejection fraction, by estimation, is 60 to 65%. The left ventricle has normal function. The left ventricle has no regional wall motion abnormalities. The left ventricular internal cavity size was normal in size. There is  no left ventricular hypertrophy. Left ventricular diastolic parameters are consistent with Grade I diastolic dysfunction (impaired relaxation). Right Ventricle: The right ventricular size is normal. Right ventricular systolic function is normal. Left Atrium: Left atrial size was normal in size. Right Atrium: Right atrial size was normal in size. Pericardium: There is no evidence of pericardial effusion. Mitral Valve: The mitral valve is normal in structure. Trivial mitral valve regurgitation. No evidence of mitral valve stenosis. Tricuspid Valve: The tricuspid valve is normal in structure. Tricuspid valve regurgitation is trivial. No evidence of tricuspid stenosis. Aortic Valve: The aortic valve has an indeterminant number of cusps. Aortic valve regurgitation is trivial. No aortic stenosis is present. Aortic valve mean  gradient measures 6.0 mmHg. Aortic valve peak gradient measures 10.1 mmHg. Pulmonic Valve: The pulmonic valve was not well visualized. Pulmonic valve regurgitation is not visualized. No evidence of pulmonic stenosis. Aorta: The aortic root is normal in size and structure. Venous: The inferior vena cava was not well visualized. IAS/Shunts: The interatrial septum was not well visualized. Additional Comments: Limited study; no subcostal views obtained.  LEFT VENTRICLE PLAX 2D LVIDd:         4.60 cm   Diastology LVIDs:         2.60 cm   LV e' medial:    7.07 cm/s LV PW:         0.90 cm   LV E/e' medial:  11.1 LV IVS:        0.60 cm   LV e' lateral:   7.96 cm/s LVOT diam:     1.70 cm   LV E/e' lateral: 9.9 LV SV:         43 LV SV Index:   31 LVOT Area:  2.27 cm  RIGHT VENTRICLE RV Basal diam:  3.50 cm RV S prime:     10.70 cm/s TAPSE (M-mode): 2.3 cm LEFT ATRIUM             Index        RIGHT ATRIUM           Index LA diam:        3.30 cm 2.40 cm/m   RA Area:     15.40 cm LA Vol (A2C):   47.6 ml 34.68 ml/m  RA Volume:   48.00 ml  34.97 ml/m LA Vol (A4C):   37.9 ml 27.61 ml/m LA Biplane Vol: 44.4 ml 32.35 ml/m  AORTIC VALVE AV Area (Vmax):    1.04 cm AV Area (Vmean):   0.95 cm AV Area (VTI):     1.09 cm AV Vmax:           159.00 cm/s AV Vmean:          118.000 cm/s AV VTI:            0.393 m AV Peak Grad:      10.1 mmHg AV Mean Grad:      6.0 mmHg LVOT Vmax:         73.20 cm/s LVOT Vmean:        49.500 cm/s LVOT VTI:          0.189 m LVOT/AV VTI ratio: 0.48 MITRAL VALVE MV Area (PHT): 4.12 cm     SHUNTS MV Decel Time: 184 msec     Systemic VTI:  0.19 m MV E velocity: 78.50 cm/s   Systemic Diam: 1.70 cm MV A velocity: 108.00 cm/s MV E/A ratio:  0.73 Redell Shallow MD Electronically signed by Redell Shallow MD Signature Date/Time: 06/17/2024/12:30:59 PM    Final      Medical Consultants:   None.   Subjective:    Courtney Avila no complaints.  Objective:    Vitals:   06/18/24 0501 06/18/24 1331  06/18/24 2033 06/19/24 0412  BP: 132/61 128/65 102/71 (!) 135/59  Pulse: 64 66 69 63  Resp: 16  19 18   Temp: 97.9 F (36.6 C) 98.1 F (36.7 C) 98.5 F (36.9 C) 98 F (36.7 C)  TempSrc: Oral Oral Oral Oral  SpO2: 100% 99% 100% 100%   SpO2: 100 % O2 Flow Rate (L/min): 3 L/min   Intake/Output Summary (Last 24 hours) at 06/19/2024 0834 Last data filed at 06/19/2024 9392 Gross per 24 hour  Intake 1318 ml  Output 650 ml  Net 668 ml   There were no vitals filed for this visit.  Exam: General exam: In no acute distress. Respiratory system: Good air movement and clear to auscultation. Cardiovascular system: S1 & S2 heard, RRR. No JVD. Gastrointestinal system: Abdomen is nondistended, soft and nontender.  Extremities: No pedal edema. Skin: No rashes, lesions or ulcers Psychiatry: No judgment or insight of medical condition.   Data Reviewed:    Labs: Basic Metabolic Panel: Recent Labs  Lab 06/15/24 2214 06/16/24 0918 06/16/24 2258 06/17/24 1318 06/18/24 0236  NA 138 139 137 137 139  K 4.6 4.0 4.8 4.5 4.0  CL 104 106 104 106 107  CO2 24 24 25 23 26   GLUCOSE 91 113* 158* 150* 147*  BUN 35* 22 20 21 19   CREATININE 0.72 0.58 0.67 0.74 0.63  CALCIUM 9.0 8.6* 8.4* 8.5* 8.3*  MG  --   --   --  2.0 2.1   GFR  CrCl cannot be calculated (Unknown ideal weight.). Liver Function Tests: Recent Labs  Lab 06/15/24 2214 06/16/24 0918 06/16/24 2258 06/18/24 0236  AST 50* 50* 46* 95*  ALT 24 23 22 23   ALKPHOS 94 86 68 69  BILITOT 0.4 0.5 0.4 0.5  PROT 6.3* 5.9* 5.3* 5.0*  ALBUMIN  3.2* 3.0* 3.1* 2.7*   Recent Labs  Lab 06/15/24 2214  LIPASE 74*   No results for input(s): AMMONIA in the last 168 hours. Coagulation profile Recent Labs  Lab 06/16/24 0918  INR 1.1   COVID-19 Labs  No results for input(s): DDIMER, FERRITIN, LDH, CRP in the last 72 hours.  Lab Results  Component Value Date   SARSCOV2NAA NEGATIVE 06/15/2024   SARSCOV2NAA NEGATIVE  08/04/2022    CBC: Recent Labs  Lab 06/15/24 1942 06/16/24 0918 06/17/24 1318 06/17/24 1539 06/18/24 0236  WBC 9.3 7.1 10.4 10.9* 8.8  NEUTROABS 7.5 5.2  --   --  6.4  HGB 10.5* 9.1* 6.1* 5.9* 9.2*  HCT 33.0* 28.2* 18.9* 18.3* 27.3*  MCV 95.7 94.9 94.5 95.8 92.9  PLT 320 335 355 341 298   Cardiac Enzymes: Recent Labs  Lab 06/15/24 2208 06/16/24 0918  CKTOTAL 481* 461*   BNP (last 3 results) No results for input(s): PROBNP in the last 8760 hours. CBG: No results for input(s): GLUCAP in the last 168 hours. D-Dimer: No results for input(s): DDIMER in the last 72 hours. Hgb A1c: No results for input(s): HGBA1C in the last 72 hours. Lipid Profile: No results for input(s): CHOL, HDL, LDLCALC, TRIG, CHOLHDL, LDLDIRECT in the last 72 hours. Thyroid  function studies: No results for input(s): TSH, T4TOTAL, T3FREE, THYROIDAB in the last 72 hours.  Invalid input(s): FREET3 Anemia work up: No results for input(s): VITAMINB12, FOLATE, FERRITIN, TIBC, IRON, RETICCTPCT in the last 72 hours. Sepsis Labs: Recent Labs  Lab 06/15/24 2018 06/16/24 0918 06/17/24 1318 06/17/24 1539 06/18/24 0236  WBC  --  7.1 10.4 10.9* 8.8  LATICACIDVEN 1.5  --   --   --   --    Microbiology Recent Results (from the past 240 hours)  Blood culture (routine x 2)     Status: Abnormal   Collection Time: 06/15/24  8:10 PM   Specimen: BLOOD  Result Value Ref Range Status   Specimen Description   Final    BLOOD RIGHT ANTECUBITAL Performed at [facility], [address] [place], [place] [address]    Special Requests   Final    BOTTLES DRAWN AEROBIC AND ANAEROBIC Blood Culture adequate volume Performed at [facility], [address] [place], [place] [address]    Culture  Setup Time   Final    GRAM POSITIVE COCCI IN CLUSTERS IN BOTH AEROBIC AND ANAEROBIC BOTTLES CRITICAL RESULT CALLED TO, READ BACK BY AND VERIFIED  WITH: PHARMD SYDNEY DAVIS [id number] AT 2050 BY EC    Culture (A)  Final    STAPHYLOCOCCUS HOMINIS STAPHYLOCOCCUS EPIDERMIDIS THE SIGNIFICANCE OF ISOLATING THIS ORGANISM FROM A SINGLE SET OF BLOOD CULTURES WHEN MULTIPLE SETS ARE DRAWN IS UNCERTAIN. PLEASE NOTIFY THE MICROBIOLOGY DEPARTMENT WITHIN ONE WEEK IF SPECIATION AND SENSITIVITIES ARE REQUIRED. Performed at [facility], [address], [place], [place] [address]    Report Status 06/18/2024 FINAL  Final  Blood Culture ID Panel (Reflexed)     Status: Abnormal   Collection Time: 06/15/24  8:10 PM  Result Value Ref Range Status   Enterococcus faecalis NOT DETECTED NOT DETECTED Final  Enterococcus Faecium NOT DETECTED NOT DETECTED Final   Listeria monocytogenes NOT DETECTED NOT DETECTED Final   Staphylococcus species DETECTED (A) NOT DETECTED Final    Comment: CRITICAL RESULT CALLED TO, READ BACK BY AND VERIFIED WITH: PHARMD SYDNEY DAVIS [id number] AT 2050 BY EC    Staphylococcus aureus (BCID) NOT DETECTED NOT DETECTED Final   Staphylococcus epidermidis DETECTED (A) NOT DETECTED Final    Comment: Methicillin (oxacillin) resistant coagulase negative staphylococcus. Possible blood culture contaminant (unless isolated from more than one blood culture draw or clinical case suggests pathogenicity). No antibiotic treatment is indicated for blood  culture contaminants. REPEAT WITH NEW VIAL OF CTRL PHARMD SYDNEY DAVIS [id number] AT 2050 BY EC    Staphylococcus lugdunensis NOT DETECTED NOT DETECTED Final   Streptococcus species NOT DETECTED NOT DETECTED Final   Streptococcus agalactiae NOT DETECTED NOT DETECTED Final   Streptococcus pneumoniae NOT DETECTED NOT DETECTED Final   Streptococcus pyogenes NOT DETECTED NOT DETECTED Final   A.calcoaceticus-baumannii NOT DETECTED NOT DETECTED Final   Bacteroides fragilis NOT DETECTED NOT DETECTED Final   Enterobacterales NOT DETECTED NOT DETECTED Final   Enterobacter cloacae complex NOT DETECTED  NOT DETECTED Final   Escherichia coli NOT DETECTED NOT DETECTED Final   Klebsiella aerogenes NOT DETECTED NOT DETECTED Final   Klebsiella oxytoca NOT DETECTED NOT DETECTED Final   Klebsiella pneumoniae NOT DETECTED NOT DETECTED Final   Proteus species NOT DETECTED NOT DETECTED Final   Salmonella species NOT DETECTED NOT DETECTED Final   Serratia marcescens NOT DETECTED NOT DETECTED Final   Haemophilus influenzae NOT DETECTED NOT DETECTED Final   Neisseria meningitidis NOT DETECTED NOT DETECTED Final   Pseudomonas aeruginosa NOT DETECTED NOT DETECTED Final   Stenotrophomonas maltophilia NOT DETECTED NOT DETECTED Final   Candida albicans NOT DETECTED NOT DETECTED Final   Candida auris NOT DETECTED NOT DETECTED Final   Candida glabrata NOT DETECTED NOT DETECTED Final   Candida krusei NOT DETECTED NOT DETECTED Final   Candida parapsilosis NOT DETECTED NOT DETECTED Final   Candida tropicalis NOT DETECTED NOT DETECTED Final   Cryptococcus neoformans/gattii NOT DETECTED NOT DETECTED Final   Methicillin resistance mecA/C DETECTED (A) NOT DETECTED Final    Comment: CRITICAL RESULT CALLED TO, READ BACK BY AND VERIFIED WITH: PHARMD SYDNEY DAVIS [id number] AT 2050 BY EC Performed at [facility], [address], [place], [place] [address]   Resp panel by RT-PCR (RSV, Flu A&B, Covid) Anterior Nasal Swab     Status: None   Collection Time: 06/15/24 10:13 PM   Specimen: Anterior Nasal Swab  Result Value Ref Range Status   SARS Coronavirus 2 by RT PCR NEGATIVE NEGATIVE Final    Comment: (NOTE) SARS-CoV-2 target nucleic acids are NOT DETECTED.  The SARS-CoV-2 RNA is generally detectable in upper respiratory specimens during the acute phase of infection. The lowest concentration of SARS-CoV-2 viral copies this assay can detect is 138 copies/mL. A negative result does not preclude SARS-Cov-2 infection and should not be used as the sole basis for treatment or other patient management decisions.  A negative result may occur with  improper specimen collection/handling, submission of specimen other than nasopharyngeal swab, presence of viral mutation(s) within the areas targeted by this assay, and inadequate number of viral copies(<138 copies/mL). A negative result must be combined with clinical observations, patient history, and epidemiological information. The expected result is Negative.  Fact Sheet for Patients:  bloggercourse.com  Fact Sheet for Healthcare Providers:  seriousbroker.it  This test is no t  yet approved or cleared by the [place]  FDA and  has been authorized for detection and/or diagnosis of SARS-CoV-2 by FDA under an Emergency Use Authorization (EUA). This EUA will remain  in effect (meaning this test can be used) for the duration of the COVID-19 declaration under Section 564(b)(1) of the Act, 21 U.S.C.section 360bbb-3(b)(1), unless the authorization is terminated  or revoked sooner.       Influenza A by PCR NEGATIVE NEGATIVE Final   Influenza B by PCR NEGATIVE NEGATIVE Final    Comment: (NOTE) The Xpert Xpress SARS-CoV-2/FLU/RSV plus assay is intended as an aid in the diagnosis of influenza from Nasopharyngeal swab specimens and should not be used as a sole basis for treatment. Nasal washings and aspirates are unacceptable for Xpert Xpress SARS-CoV-2/FLU/RSV testing.  Fact Sheet for Patients: bloggercourse.com  Fact Sheet for Healthcare Providers: seriousbroker.it  This test is not yet approved or cleared by the [place]  FDA and has been authorized for detection and/or diagnosis of SARS-CoV-2 by FDA under an Emergency Use Authorization (EUA). This EUA will remain in effect (meaning this test can be used) for the duration of the COVID-19 declaration under Section 564(b)(1) of the Act, 21 U.S.C. section 360bbb-3(b)(1), unless the authorization  is terminated or revoked.     Resp Syncytial Virus by PCR NEGATIVE NEGATIVE Final    Comment: (NOTE) Fact Sheet for Patients: bloggercourse.com  Fact Sheet for Healthcare Providers: seriousbroker.it  This test is not yet approved or cleared by the [place]  FDA and has been authorized for detection and/or diagnosis of SARS-CoV-2 by FDA under an Emergency Use Authorization (EUA). This EUA will remain in effect (meaning this test can be used) for the duration of the COVID-19 declaration under Section 564(b)(1) of the Act, 21 U.S.C. section 360bbb-3(b)(1), unless the authorization is terminated or revoked.  Performed at [facility], [address] [place], [place] [address]   Culture, blood (Routine X 2) w Reflex to ID Panel     Status: None (Preliminary result)   Collection Time: 06/17/24  1:18 PM   Specimen: BLOOD  Result Value Ref Range Status   Specimen Description   Final    BLOOD SITE NOT SPECIFIED Performed at [facility], [address], [place], [place] [address]    Special Requests   Final    BOTTLES DRAWN AEROBIC ONLY Blood Culture results may not be optimal due to an inadequate volume of blood received in culture bottles Performed at [facility], [address], [place], [place] [address]    Culture   Final    NO GROWTH < 24 HOURS Performed at [facility], [address], [place], [place] [address]    Report Status PENDING  Incomplete  Culture, blood (Routine X 2) w Reflex to ID Panel     Status: None (Preliminary result)   Collection Time: 06/17/24  1:24 PM   Specimen: BLOOD  Result Value Ref Range Status   Specimen Description   Final    BLOOD SITE NOT SPECIFIED Performed at [facility], [address], [place], [place] [address]    Special Requests   Final    BOTTLES DRAWN AEROBIC ONLY Blood Culture adequate volume Performed at [facility], [address] [place], [place] [address]    Culture   Final    NO GROWTH < 24 HOURS Performed at [facility], [address], [place], [place] [address]  Report Status PENDING  Incomplete     Medications:    acetaminophen   1,000 mg Oral Q8H   aspirin   81 mg Oral BID   busPIRone   10 mg Oral BID   docusate sodium   100 mg Oral BID   PARoxetine   40 mg Oral QHS   temazepam   30 mg Oral QHS   Continuous Infusions:  dextrose  5% lactated ringers  75 mL/hr at 06/19/24 0350      LOS: 3 days   Courtney Avila  [facility]  06/19/2024, 8:34 AM

## 2024-06-19 NOTE — Progress Notes (Addendum)
 Patient ID: Courtney Avila, female   DOB: January 29, 1941, 83 y.o.   MRN: [id number] The patient is awake and alert but not oriented.  She is likely at her baseline dementia she is now postop day 3 status post a right total hip arthroplasty to treat a significantly displaced and impacted femoral neck fracture.  She is afebrile.  Her blood pressure is normal.  Her white blood cell count is only 9.9.  There was concern about potentially little cellulitis around her right hip.  And I can see where nursing has marked lines around her.  I did change her right hip dressing and her incision is clean and dry.  There is no purulence and this does not look like an infection at all from a hip standpoint and from surgery.  This may be to even ice it has been on the patient's hip.  I am not opposed to an antibiotic but it does not look like the patient needs any type of surgical intervention.  I will stop her knee immobilizer though because I am concerned about skin breakdown and pressure points from the knee immobilizer itself.  The hip itself I believe is stable.  From a DVT prophylaxis standpoint, I will print out a prescription for an 81 mg aspirin  twice daily.  I will also add a prescription for Bactrim  double strength to take twice a day until her follow-up appointment in 2 weeks.  Today's  total administered Morphine  Milligram Equivalents: 32

## 2024-06-19 NOTE — Discharge Instructions (Signed)
 The current dressing should remain in the patient's right hip until her orthopedic outpatient follow-up in 2 weeks. This dressing can get wet in the shower. The patient should only be up with maximum assistance and can weight-bear as tolerated on the right hip.

## 2024-06-20 NOTE — Progress Notes (Signed)
 Physical Therapy Treatment Patient Details Name: Courtney Avila MRN: [id number] DOB: 02-08-1941 Today's Date: 06/20/2024   History of Present Illness 83 y.o. female was brought by EMS from home with concern for possible UTI by family. Patient apparently has had generalized pain since she had a fall 2 days ago. She is also having pelvic pain that the family thought was possibly UTI. She is normally ambulatory and can communicate despite advanced dementia. She had a fall 2 prior to admission and since that she has been less ambulatory. on Evaluation in ED pt was determined to have a 7 mm subdural hematoma,  right intertrochanteric fracture,  and and L1 compression fracture all presumed to be from the fall. Orthopedics and neurosurgery were both consulted PMH: Alzheimer's dementia, osteoarthritis, GERD, anxiety disorder, hypertension   was brought by EMS from home with concern for possible UTI by family.   Orthopedics and neurosurgery were both consulted. pt is s/p R DA THA    PT Comments  Pt reported severe pain with attempted movement of LLE and of RLE, RN administered IV morphine . +2 total assist for supine to sit. Pt sat edge of bed with mod to max assist for trunk support, she had brief periods of being able to maintain her trunk in neutral without assist. Pt unable to actively extend B knees in sitting. L knee appears to be contracted in ~70* flexion. Pt not a reliable historian so was not able to provide information about prior condition of her LEs. Pain and anxiety limiting activity tolerance.    If plan is discharge home, recommend the following: Two people to help with bathing/dressing/bathroom;Help with stairs or ramp for entrance;Assist for transportation;Two people to help with walking and/or transfers   Can travel by private vehicle        Equipment Recommendations  Other (comment) (TBD at next venue)    Recommendations for Other Services       Precautions / Restrictions  Precautions Precautions: Fall Precaution/Restrictions Comments: KI DCed Restrictions Weight Bearing Restrictions Per Provider Order: No RLE Weight Bearing Per Provider Order: Weight bearing as tolerated     Mobility  Bed Mobility Overal bed mobility: Needs Assistance Bed Mobility: Supine to Sit, Sit to Supine     Supine to sit: Total assist, +2 for physical assistance Sit to supine: Total assist, +2 for physical assistance   General bed mobility comments: pt not able to assist 2* anxiety and Pain; pt sat EOB ~8 minutes with min to max assist for trunk control    Transfers                   General transfer comment: NT 2* pain, anxiety, poor sitting balance    Ambulation/Gait                   Stairs             Wheelchair Mobility     Tilt Bed    Modified Rankin (Stroke Patients Only)       Balance Overall balance assessment: Needs assistance Sitting-balance support: Feet supported, Bilateral upper extremity supported Sitting balance-Leahy Scale: Zero Sitting balance - Comments: posterior lean requiring mod to max assist, brief periods of pt holding trunk in neutral without assist; pt unable to reach outside of base of support Postural control: Posterior lean  Communication Communication Communication: Impaired Factors Affecting Communication: Reduced clarity of speech  Cognition Arousal: Alert Behavior During Therapy: Anxious   PT - Cognitive impairments: History of cognitive impairments, No family/caregiver present to determine baseline                         Following commands: Impaired Following commands impaired: Follows one step commands inconsistently    Cueing Cueing Techniques: Verbal cues, Tactile cues, Gestural cues  Exercises General Exercises - Lower Extremity Ankle Circles/Pumps: AAROM, Both, 10 reps, Supine Long Arc Quad: Limitations Long Arc Quad  Limitations: attempted in sitting but pt unable; L knee appears to be contracted in flexed position, knee ext PROM L knee ~ -70* Heel Slides: Limitations Heel Slides Limitations: could not tolerate 2* pain    General Comments        Pertinent Vitals/Pain Pain Assessment Faces Pain Scale: Hurts worst Breathing: noisy labored breathing, long periods of hyperventilation, Cheyne-Stokes respirations Negative Vocalization: repeated troubled calling out, loud moaning/groaning, crying Facial Expression: facial grimacing Body Language: tense, distressed pacing, fidgeting Consolability: distracted or reassured by voice/touch PAINAD Score: 8 Pain Location: L knee pain with attempted movement Pain Descriptors / Indicators: Grimacing, Guarding, Crying Pain Intervention(s): Limited activity within patient's tolerance, Monitored during session, Premedicated before session, Patient requesting pain meds-RN notified, Repositioned    Home Living                          Prior Function            PT Goals (current goals can now be found in the care plan section) Acute Rehab PT Goals PT Goal Formulation: Patient unable to participate in goal setting Time For Goal Achievement: 07/01/24 Potential to Achieve Goals: Poor Progress towards PT goals: Progressing toward goals    Frequency    Min 2X/week      PT Plan      Co-evaluation              AM-PAC PT 6 Clicks Mobility   Outcome Measure  Help needed turning from your back to your side while in a flat bed without using bedrails?: Total Help needed moving from lying on your back to sitting on the side of a flat bed without using bedrails?: Total Help needed moving to and from a bed to a chair (including a wheelchair)?: Total Help needed standing up from a chair using your arms (e.g., wheelchair or bedside chair)?: Total Help needed to walk in hospital room?: Total Help needed climbing 3-5 steps with a railing? :  Total 6 Click Score: 6    End of Session   Activity Tolerance: Patient limited by pain Patient left: in bed;with bed alarm set;with call bell/phone within reach Nurse Communication: Mobility status PT Visit Diagnosis: Other abnormalities of gait and mobility (R26.89)     Time: 8546-8480 PT Time Calculation (min) (ACUTE ONLY): 26 min  Charges:    $Therapeutic Activity: 23-37 mins PT General Charges $$ ACUTE PT VISIT: 1 Visit                     Sylvan Delon Copp PT 06/20/2024  Acute Rehabilitation Services  Office [phone]

## 2024-06-20 NOTE — Progress Notes (Signed)
 [facility] PROGRESS NOTE    Progress Note  Courtney Avila  FMW:[id number] DOB: 09-15-1940 DOA: 06/15/2024 PCP: Courtney Harlene POUR, NP     Brief Narrative:   Courtney Avila is an 83 y.o. 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  Neurosurgery was consulted recommended conservative management.   Assessment/Plan:   Subdural hematoma (HCC) 2 CT scan 8 hours apart showed stable subdural hematoma. Discussed with neurosurgery recommended no further treatment they recommended to initiate DVT prophylaxis for hip surgery on 06/17/2024. Follow-up with them as an outpatient. Medically stable, awaiting skilled nursing facility placement.  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 Narcotics and DVT prophylaxis per orthopedic doctor. Orthopedic surgery recommended to keep knee immobilizer on to keep the leg straight short-term. Aspirin  twice a day for DVT prophylaxis. Orthopedic surgery recommended a round of Bactrim .  Unwitnessed fall: Etiology unclear with a history of dementia history is limited. Evaluated by PT will need skilled nursing facility placement.  Postop acute blood loss anemia: Hemoglobin postoperatively dropped to 6.1. Transfused 2 units packed red blood cells. Hemoglobin this morning is 9.2.  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 Has been calm throughout her hospital stay no agitation.   Insomnia: Continue Restoril .  Anxiety and mood disorder: Continue BuSpar .  Oral intake: On D5.  Goals of care: She is currently DNR/DNI.  Concern for UTI: UA was unremarkable. Blood cultures remain negative till date she  completed a 3-day course of IV antibiotics in house.  1/2 blood cultures positive for staph epi: Likely contaminant.   DVT prophylaxis: scd Family Communication:none Status is: Inpatient Remains inpatient appropriate because: Right hip fracture    Code Status:     Code Status Orders  (From admission, onward)           Start     Ordered   06/16/24 0049  Do not attempt resuscitation (DNR)- Limited -Do Not Intubate (DNI)  Continuous       Question Answer Comment  If pulseless and not breathing No CPR or chest compressions.   In Pre-Arrest Conditions (Patient Is Breathing and Has A Pulse) Do not intubate. Provide all appropriate non-invasive medical interventions. Avoid ICU transfer unless indicated or required.   Consent: Discussion documented in EHR or advanced directives reviewed      06/16/24 0049           Code Status History     Date Active Date Inactive Code Status Order ID Comments User Context   01/24/2024 1529 06/15/2024 1835 DNR [id number]  Courtney Harlene POUR, NP Outpatient   12/07/2016 1636 12/09/2016 1730 Full Code [id number]  Melodi Lerner, MD Inpatient   04/09/2016 1215 04/10/2016 2031 Full Code [id number]  Garland Sora, PA-C Inpatient         IV Access:   Peripheral IV   Procedures and diagnostic studies:   No results found.    Medical Consultants:   None.   Subjective:    Leydi A Hillegass no complaints.  Objective:    Vitals:   06/19/24 0412 06/19/24 1341 06/19/24 2023 06/20/24 0413  BP: (!) 135/59 128/62 (!) 144/52 (!) 150/68  Pulse: 63 73 78 68  Resp: 18 16 17 18   Temp:  98 F (36.7 C) 98.9 F (37.2 C) 98.6 F (37 C) 98.2 F (36.8 C)  TempSrc: Oral Oral Oral Oral  SpO2: 100%  98% 96%   SpO2: 96 % O2 Flow Rate (L/min): 3 L/min   Intake/Output Summary (Last 24 hours) at 06/20/2024 0954 Last data filed at 06/20/2024 0415 Gross per 24 hour  Intake 120 ml  Output 300 ml  Net -180 ml   There were no vitals filed for this  visit.  Exam: General exam: In no acute distress. Respiratory system: Good air movement and clear to auscultation. Cardiovascular system: S1 & S2 heard, RRR. No JVD. Gastrointestinal system: Abdomen is nondistended, soft and nontender.  Extremities: No pedal edema. Skin: No rashes, lesions or ulcers Psychiatry: Judgement and insight appear normal. Mood & affect appropriate.  Data Reviewed:    Labs: Basic Metabolic Panel: Recent Labs  Lab 06/15/24 2214 06/16/24 0918 06/16/24 2258 06/17/24 1318 06/18/24 0236  NA 138 139 137 137 139  K 4.6 4.0 4.8 4.5 4.0  CL 104 106 104 106 107  CO2 24 24 25 23 26   GLUCOSE 91 113* 158* 150* 147*  BUN 35* 22 20 21 19   CREATININE 0.72 0.58 0.67 0.74 0.63  CALCIUM 9.0 8.6* 8.4* 8.5* 8.3*  MG  --   --   --  2.0 2.1   GFR CrCl cannot be calculated (Unknown ideal weight.). Liver Function Tests: Recent Labs  Lab 06/15/24 2214 06/16/24 0918 06/16/24 2258 06/18/24 0236  AST 50* 50* 46* 95*  ALT 24 23 22 23   ALKPHOS 94 86 68 69  BILITOT 0.4 0.5 0.4 0.5  PROT 6.3* 5.9* 5.3* 5.0*  ALBUMIN  3.2* 3.0* 3.1* 2.7*   Recent Labs  Lab 06/15/24 2214  LIPASE 74*   No results for input(s): AMMONIA in the last 168 hours. Coagulation profile Recent Labs  Lab 06/16/24 0918  INR 1.1   COVID-19 Labs  No results for input(s): DDIMER, FERRITIN, LDH, CRP in the last 72 hours.  Lab Results  Component Value Date   SARSCOV2NAA NEGATIVE 06/15/2024   SARSCOV2NAA NEGATIVE 08/04/2022    CBC: Recent Labs  Lab 06/15/24 1942 06/16/24 0918 06/17/24 1318 06/17/24 1539 06/18/24 0236 06/19/24 1516  WBC 9.3 7.1 10.4 10.9* 8.8 9.9  NEUTROABS 7.5 5.2  --   --  6.4  --   HGB 10.5* 9.1* 6.1* 5.9* 9.2* 9.4*  HCT 33.0* 28.2* 18.9* 18.3* 27.3* 28.0*  MCV 95.7 94.9 94.5 95.8 92.9 92.7  PLT 320 335 355 341 298 388   Cardiac Enzymes: Recent Labs  Lab 06/15/24 2208 06/16/24 0918  CKTOTAL 481* 461*   BNP (last 3 results) No results for  input(s): PROBNP in the last 8760 hours. CBG: No results for input(s): GLUCAP in the last 168 hours. D-Dimer: No results for input(s): DDIMER in the last 72 hours. Hgb A1c: No results for input(s): HGBA1C in the last 72 hours. Lipid Profile: No results for input(s): CHOL, HDL, LDLCALC, TRIG, CHOLHDL, LDLDIRECT in the last 72 hours. Thyroid  function studies: No results for input(s): TSH, T4TOTAL, T3FREE, THYROIDAB in the last 72 hours.  Invalid input(s): FREET3 Anemia work up: No results for input(s): VITAMINB12, FOLATE, FERRITIN, TIBC, IRON, RETICCTPCT in the last 72 hours. Sepsis Labs: Recent Labs  Lab 06/15/24 2018 06/16/24 0918 06/17/24 1318 06/17/24 1539 06/18/24 0236 06/19/24 1516  WBC  --    < > 10.4 10.9* 8.8 9.9  LATICACIDVEN 1.5  --   --   --   --   --    < > =  values in this interval not displayed.   Microbiology Recent Results (from the past 240 hours)  Blood culture (routine x 2)     Status: Abnormal   Collection Time: 06/15/24  8:10 PM   Specimen: BLOOD  Result Value Ref Range Status   Specimen Description   Final    BLOOD RIGHT ANTECUBITAL Performed at [facility], [address] [place], [place] [address]    Special Requests   Final    BOTTLES DRAWN AEROBIC AND ANAEROBIC Blood Culture adequate volume Performed at [facility], [address], [place], [place] [address]    Culture  Setup Time   Final    GRAM POSITIVE COCCI IN CLUSTERS IN BOTH AEROBIC AND ANAEROBIC BOTTLES CRITICAL RESULT CALLED TO, READ BACK BY AND VERIFIED WITH: PHARMD SYDNEY DAVIS [id number] AT 2050 BY EC    Culture (A)  Final    STAPHYLOCOCCUS HOMINIS STAPHYLOCOCCUS EPIDERMIDIS THE SIGNIFICANCE OF ISOLATING THIS ORGANISM FROM A SINGLE SET OF BLOOD CULTURES WHEN MULTIPLE SETS ARE DRAWN IS UNCERTAIN. PLEASE NOTIFY THE MICROBIOLOGY DEPARTMENT WITHIN ONE WEEK IF SPECIATION AND SENSITIVITIES ARE  REQUIRED. Performed at [facility], [address], [place], [place] [address]    Report Status 06/18/2024 FINAL  Final  Blood Culture ID Panel (Reflexed)     Status: Abnormal   Collection Time: 06/15/24  8:10 PM  Result Value Ref Range Status   Enterococcus faecalis NOT DETECTED NOT DETECTED Final   Enterococcus Faecium NOT DETECTED NOT DETECTED Final   Listeria monocytogenes NOT DETECTED NOT DETECTED Final   Staphylococcus species DETECTED (A) NOT DETECTED Final    Comment: CRITICAL RESULT CALLED TO, READ BACK BY AND VERIFIED WITH: PHARMD SYDNEY DAVIS [id number] AT 2050 BY EC    Staphylococcus aureus (BCID) NOT DETECTED NOT DETECTED Final   Staphylococcus epidermidis DETECTED (A) NOT DETECTED Final    Comment: Methicillin (oxacillin) resistant coagulase negative staphylococcus. Possible blood culture contaminant (unless isolated from more than one blood culture draw or clinical case suggests pathogenicity). No antibiotic treatment is indicated for blood  culture contaminants. REPEAT WITH NEW VIAL OF CTRL PHARMD SYDNEY DAVIS [id number] AT 2050 BY EC    Staphylococcus lugdunensis NOT DETECTED NOT DETECTED Final   Streptococcus species NOT DETECTED NOT DETECTED Final   Streptococcus agalactiae NOT DETECTED NOT DETECTED Final   Streptococcus pneumoniae NOT DETECTED NOT DETECTED Final   Streptococcus pyogenes NOT DETECTED NOT DETECTED Final   A.calcoaceticus-baumannii NOT DETECTED NOT DETECTED Final   Bacteroides fragilis NOT DETECTED NOT DETECTED Final   Enterobacterales NOT DETECTED NOT DETECTED Final   Enterobacter cloacae complex NOT DETECTED NOT DETECTED Final   Escherichia coli NOT DETECTED NOT DETECTED Final   Klebsiella aerogenes NOT DETECTED NOT DETECTED Final   Klebsiella oxytoca NOT DETECTED NOT DETECTED Final   Klebsiella pneumoniae NOT DETECTED NOT DETECTED Final   Proteus species NOT DETECTED NOT DETECTED Final   Salmonella species NOT DETECTED NOT DETECTED Final    Serratia marcescens NOT DETECTED NOT DETECTED Final   Haemophilus influenzae NOT DETECTED NOT DETECTED Final   Neisseria meningitidis NOT DETECTED NOT DETECTED Final   Pseudomonas aeruginosa NOT DETECTED NOT DETECTED Final   Stenotrophomonas maltophilia NOT DETECTED NOT DETECTED Final   Candida albicans NOT DETECTED NOT DETECTED Final   Candida auris NOT DETECTED NOT DETECTED Final   Candida glabrata NOT DETECTED NOT DETECTED Final   Candida krusei NOT DETECTED NOT DETECTED Final   Candida parapsilosis NOT DETECTED NOT DETECTED Final  Candida tropicalis NOT DETECTED NOT DETECTED Final   Cryptococcus neoformans/gattii NOT DETECTED NOT DETECTED Final   Methicillin resistance mecA/C DETECTED (A) NOT DETECTED Final    Comment: CRITICAL RESULT CALLED TO, READ BACK BY AND VERIFIED WITH: PHARMD SYDNEY DAVIS [id number] AT 2050 BY EC Performed at [facility], [address] [place], [place] [address]   Resp panel by RT-PCR (RSV, Flu A&B, Covid) Anterior Nasal Swab     Status: None   Collection Time: 06/15/24 10:13 PM   Specimen: Anterior Nasal Swab  Result Value Ref Range Status   SARS Coronavirus 2 by RT PCR NEGATIVE NEGATIVE Final    Comment: (NOTE) SARS-CoV-2 target nucleic acids are NOT DETECTED.  The SARS-CoV-2 RNA is generally detectable in upper respiratory specimens during the acute phase of infection. The lowest concentration of SARS-CoV-2 viral copies this assay can detect is 138 copies/mL. A negative result does not preclude SARS-Cov-2 infection and should not be used as the sole basis for treatment or other patient management decisions. A negative result may occur with  improper specimen collection/handling, submission of specimen other than nasopharyngeal swab, presence of viral mutation(s) within the areas targeted by this assay, and inadequate number of viral copies(<138 copies/mL). A negative result must be combined with clinical observations, patient history, and  epidemiological information. The expected result is Negative.  Fact Sheet for Patients:  bloggercourse.com  Fact Sheet for Healthcare Providers:  seriousbroker.it  This test is no t yet approved or cleared by the [place]  FDA and  has been authorized for detection and/or diagnosis of SARS-CoV-2 by FDA under an Emergency Use Authorization (EUA). This EUA will remain  in effect (meaning this test can be used) for the duration of the COVID-19 declaration under Section 564(b)(1) of the Act, 21 U.S.C.section 360bbb-3(b)(1), unless the authorization is terminated  or revoked sooner.       Influenza A by PCR NEGATIVE NEGATIVE Final   Influenza B by PCR NEGATIVE NEGATIVE Final    Comment: (NOTE) The Xpert Xpress SARS-CoV-2/FLU/RSV plus assay is intended as an aid in the diagnosis of influenza from Nasopharyngeal swab specimens and should not be used as a sole basis for treatment. Nasal washings and aspirates are unacceptable for Xpert Xpress SARS-CoV-2/FLU/RSV testing.  Fact Sheet for Patients: bloggercourse.com  Fact Sheet for Healthcare Providers: seriousbroker.it  This test is not yet approved or cleared by the [place]  FDA and has been authorized for detection and/or diagnosis of SARS-CoV-2 by FDA under an Emergency Use Authorization (EUA). This EUA will remain in effect (meaning this test can be used) for the duration of the COVID-19 declaration under Section 564(b)(1) of the Act, 21 U.S.C. section 360bbb-3(b)(1), unless the authorization is terminated or revoked.     Resp Syncytial Virus by PCR NEGATIVE NEGATIVE Final    Comment: (NOTE) Fact Sheet for Patients: bloggercourse.com  Fact Sheet for Healthcare Providers: seriousbroker.it  This test is not yet approved or cleared by the [place]  FDA and has been  authorized for detection and/or diagnosis of SARS-CoV-2 by FDA under an Emergency Use Authorization (EUA). This EUA will remain in effect (meaning this test can be used) for the duration of the COVID-19 declaration under Section 564(b)(1) of the Act, 21 U.S.C. section 360bbb-3(b)(1), unless the authorization is terminated or revoked.  Performed at [facility], [address] [place], [place] [address]   Culture, blood (Routine X 2) w Reflex to ID Panel     Status: None (Preliminary  result)   Collection Time: 06/17/24  1:18 PM   Specimen: BLOOD  Result Value Ref Range Status   Specimen Description   Final    BLOOD SITE NOT SPECIFIED Performed at [facility], [address] [place], [place] [address]    Special Requests   Final    BOTTLES DRAWN AEROBIC ONLY Blood Culture results may not be optimal due to an inadequate volume of blood received in culture bottles Performed at [facility], [address] [place], [place] [address]    Culture   Final    NO GROWTH 2 DAYS Performed at [facility], [address], [place], [place] [address]    Report Status PENDING  Incomplete  Culture, blood (Routine X 2) w Reflex to ID Panel     Status: None (Preliminary result)   Collection Time: 06/17/24  1:24 PM   Specimen: BLOOD  Result Value Ref Range Status   Specimen Description   Final    BLOOD SITE NOT SPECIFIED Performed at [facility], [address], [place], [place] [address]    Special Requests   Final    BOTTLES DRAWN AEROBIC ONLY Blood Culture adequate volume Performed at [facility], [address] [place], [place] [address]    Culture   Final    NO GROWTH 2 DAYS Performed at [facility], [address] [place], [place] [address]    Report Status PENDING  Incomplete     Medications:    acetaminophen   1,000 mg Oral Q8H   aspirin   81 mg Oral BID   busPIRone   10 mg Oral BID    docusate sodium   100 mg Oral BID   PARoxetine   40 mg Oral QHS   sulfamethoxazole -trimethoprim   1 tablet Oral Q12H   temazepam   30 mg Oral QHS   Continuous Infusions:  dextrose  5% lactated ringers  75 mL/hr at 06/19/24 1947      LOS: 4 days   Erle Odell Castor  [facility]  06/20/2024, 9:54 AM

## 2024-06-20 NOTE — TOC Initial Note (Addendum)
 Transition of Care [facility]) - Initial/Assessment Note    Patient Details  Name: Courtney Avila MRN: [id number] Date of Birth: [birth year]  [facility]) CM/SW Contact:    Tawni CHRISTELLA Eva, LCSW Phone Number: 06/20/2024, 9:10 AM  Clinical Narrative:                  CSW spoke with the pt's son, who stated that the pt is from home and lives with him. He also mentioned that his children help care for her. CSW discussed recommendations for short-term rehab, and the pt's son is agreeable. He reported no preferences for a facility, mentioning that the pt has never been to a SNF before. CSW explained the process, including that the pt will need insurance authorization. CSW will fax the pts information for SNF placement. ICM to follow up.   ADDEN 12:00pm  CSW spoke with pt's son [name] to present bed offers. Pt's son reports he will review and call this CSW back with pt's choice. ICM to follow.   1:00pm Pt's son has chosen [employer] CSW will contact [facility] to start insurance auth. ICM to follow  Expected Discharge Plan: Skilled Nursing Facility Barriers to Discharge: Continued Medical Work up, SNF Pending bed offer, Insurance Authorization   Patient Goals and CMS Choice   CMS Medicare.gov Compare Post Acute Care list provided to:: Patient Represenative (must comment) Choice offered to / list presented to : Adult Children      Expected Discharge Plan and Services       Living arrangements for the past 2 months: Single Family Home                                      Prior Living Arrangements/Services Living arrangements for the past 2 months: Single Family Home Lives with:: Self, Adult Children Patient language and need for interpreter reviewed:: Yes Do you feel safe going back to the place where you live?: Yes      Need for Family Participation in Patient Care: No (Comment) Care giver support system in place?: No (comment)   Criminal Activity/Legal  Involvement Pertinent to Current Situation/Hospitalization: No - Comment as needed  Activities of Daily Living   ADL Screening (condition at time of admission) Independently performs ADLs?: No Is the patient deaf or have difficulty hearing?: No Does the patient have difficulty seeing, even when wearing glasses/contacts?: No Does the patient have difficulty concentrating, remembering, or making decisions?: Yes  Permission Sought/Granted                  Emotional Assessment Appearance:: Appears stated age     Orientation: : Oriented to Self   Psych Involvement: No (comment)  Admission diagnosis:  Anxiety [F41.9] Subdural hematoma (HCC) [S06.5XAA] Fall, initial encounter [id number]] Closed displaced intertrochanteric fracture of right femur, initial encounter (HCC) [S72.141A] Altered mental status, unspecified altered mental status type [R41.82] Closed compression fracture of body of L1 vertebra (HCC) [S32.010A] Patient Active Problem List   Diagnosis Date Noted   Dementia (HCC) 06/16/2024   GERD (gastroesophageal reflux disease) 06/16/2024   Closed 2-part intertrochanteric fracture of proximal end of femur with delayed healing, right 06/16/2024   Closed compression fracture of body of L1 vertebra (HCC) 06/16/2024   Subdural hematoma (HCC) 06/16/2024   Displaced fracture of base of neck of right femur, initial encounter for closed fracture (HCC) 06/16/2024   OA (osteoarthritis) of  knee 12/07/2016   S/P reverse total shoulder arthroplasty, 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 [place] [address] Phone: [phone] Fax: [phone]     Social Drivers of Health (SDOH) Social History: SDOH Screenings   Food Insecurity: No Food Insecurity (06/17/2024)  Housing: Unknown (06/17/2024)  Transportation Needs: No  Transportation Needs (06/17/2024)  Utilities: Not At Risk (06/17/2024)  Depression (PHQ2-9): Low Risk (05/01/2024)  Social Connections: Socially Isolated (06/17/2024)  Tobacco Use: Low Risk (06/16/2024)   SDOH Interventions:     Readmission Risk Interventions     No data to display

## 2024-06-20 NOTE — NC FL2 (Signed)
 [place]  MEDICAID FL2 LEVEL OF CARE FORM     IDENTIFICATION  Patient Name: Courtney Avila Birthdate: 20-Mar-1941 Sex: female Admission Date (Current Location): 06/15/2024  [facility] and [place] Number:  Producer, Television/film/video and Address:  [facility],  [address], [place] [address]      Provider Number: [id number]  Attending Physician Name and Address:  Odell Celinda Balo, MD  Relative Name and Phone Number:  Son, Emergency Contact  [phone] [facility])    Current Level of Care: Hospital Recommended Level of Care: Skilled Nursing Facility Prior Approval Number:    Date Approved/Denied:   PASRR Number: [id number]  Discharge Plan: SNF    Current Diagnoses: Patient Active Problem List   Diagnosis Date Noted   Dementia (HCC) 06/16/2024   GERD (gastroesophageal reflux disease) 06/16/2024   Closed 2-part intertrochanteric fracture of proximal end of femur with delayed healing, right 06/16/2024   Closed compression fracture of body of L1 vertebra (HCC) 06/16/2024   Subdural hematoma (HCC) 06/16/2024   Displaced fracture of base of neck of right femur, initial encounter for closed fracture (HCC) 06/16/2024   OA (osteoarthritis) of knee 12/07/2016   S/P reverse total shoulder arthroplasty, 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    Orientation RESPIRATION BLADDER Height & Weight     Self  Normal Incontinent Weight:   Height:     BEHAVIORAL SYMPTOMS/MOOD NEUROLOGICAL BOWEL NUTRITION STATUS      Continent Diet (DSY 2)  AMBULATORY STATUS COMMUNICATION OF NEEDS Skin   Limited Assist Verbally PU Stage and Appropriate Care, Other (Comment) (see d/c summary)                       Personal Care Assistance Level of Assistance  Bathing, Dressing, Feeding, Total care Bathing Assistance: Limited assistance Feeding assistance: Limited assistance Dressing Assistance: Limited assistance Total Care Assistance: Limited assistance   Functional Limitations Info  Sight, Hearing,  Speech Sight Info: Adequate Hearing Info: Adequate Speech Info: Adequate    SPECIAL CARE FACTORS FREQUENCY  PT (By licensed PT), OT (By licensed OT)     PT Frequency: 5 x a week OT Frequency: 5 x a week            Contractures Contractures Info: Not present    Additional Factors Info  Code Status, Allergies, Psychotropic Code Status Info: DNR Allergies Info: no known alleriges Psychotropic Info: busPIRone  (BUSPAR ) tablet 10 mg         Current Medications (06/20/2024):  This is the current hospital active medication list Current Facility-Administered Medications  Medication Dose Route Frequency Provider Last Rate Last Admin   acetaminophen  (TYLENOL ) tablet 650 mg  650 mg Oral Q6H PRN Vernetta Lonni GRADE, MD       Or   acetaminophen  (TYLENOL ) suppository 650 mg  650 mg Rectal Q6H PRN Vernetta Lonni GRADE, MD       acetaminophen  (TYLENOL ) tablet 1,000 mg  1,000 mg Oral Q8H Patel, Pranav M, MD   1,000 mg at 06/20/24 9486   aspirin  chewable tablet 81 mg  81 mg Oral BID Vernetta Lonni GRADE, MD   81 mg at 06/20/24 1029   busPIRone  (BUSPAR ) tablet 10 mg  10 mg Oral BID Vernetta Lonni GRADE, MD   10 mg at 06/20/24 1029   dextrose  5 % in lactated ringers  infusion   Intravenous Continuous Odell Celinda Balo, MD 75 mL/hr at 06/19/24 1947 New Bag at 06/19/24 1947   docusate sodium  (COLACE) capsule 100 mg  100  mg Oral BID Vernetta Lonni GRADE, MD   100 mg at 06/19/24 2126   menthol  (CEPACOL) lozenge 3 mg  1 lozenge Oral PRN Vernetta Lonni GRADE, MD       Or   phenol (CHLORASEPTIC) mouth spray 1 spray  1 spray Mouth/Throat PRN Vernetta Lonni GRADE, MD       methocarbamol  (ROBAXIN ) tablet 500 mg  500 mg Oral Q6H PRN Vernetta Lonni GRADE, MD   500 mg at 06/20/24 1029   morphine  (PF) 4 MG/ML injection 4 mg  4 mg Intravenous Q2H PRN Odell Celinda Balo, MD   4 mg at 06/19/24 1955   ondansetron  (ZOFRAN ) tablet 4 mg  4 mg Oral Q6H PRN Vernetta Lonni GRADE, MD        Or   ondansetron  (ZOFRAN ) injection 4 mg  4 mg Intravenous Q6H PRN Vernetta Lonni GRADE, MD   4 mg at 06/16/24 9263   PARoxetine  (PAXIL ) tablet 40 mg  40 mg Oral QHS Vernetta Lonni GRADE, MD   40 mg at 06/19/24 2126   sulfamethoxazole -trimethoprim  (BACTRIM  DS) 800-160 MG per tablet 1 tablet  1 tablet Oral Q12H Vernetta Lonni GRADE, MD   1 tablet at 06/20/24 1030   temazepam  (RESTORIL ) capsule 30 mg  30 mg Oral QHS Vernetta Lonni GRADE, MD   30 mg at 06/19/24 2125   traMADol  (ULTRAM ) tablet 50 mg  50 mg Oral Q6H PRN Patel, Pranav M, MD   50 mg at 06/20/24 1029     Discharge Medications: Please see discharge summary for a list of discharge medications.  Relevant Imaging Results:  Relevant Lab Results:   Additional Information [id number]  Tawni HERO Spirit Wernli, LCSW

## 2024-06-20 NOTE — Anesthesia Postprocedure Evaluation (Signed)
 Anesthesia Post Note  Patient: Courtney Avila  Procedure(s) Performed: HEMIARTHROPLASTY, HIP, DIRECT ANTERIOR APPROACH, FOR FRACTURE (Right: Hip)     Patient location during evaluation: PACU Anesthesia Type: General Level of consciousness: awake and alert Pain management: pain level controlled Vital Signs Assessment: post-procedure vital signs reviewed and stable Respiratory status: spontaneous breathing, nonlabored ventilation, respiratory function stable and patient connected to nasal cannula oxygen Cardiovascular status: blood pressure returned to baseline and stable Postop Assessment: no apparent nausea or vomiting Anesthetic complications: no   No notable events documented.  Last Vitals:  Vitals:   06/19/24 2023 06/20/24 0413  BP: (!) 144/52 (!) 150/68  Pulse: 78 68  Resp: 17 18  Temp: 37 C 36.8 C  SpO2: 98% 96%    Last Pain:  Vitals:   06/20/24 0413  TempSrc: Oral  PainSc:                  Nemesis Rainwater S

## 2024-06-20 NOTE — Progress Notes (Signed)
 Patient ID: Courtney Avila, female   DOB: Apr 27, 1941, 83 y.o.   MRN: [id number] The patient is likely at her baseline dementia.  Her right operative hip is stable.  The dressing that was placed yesterday remains clean and dry.  I do not see any redness today around the right hip.  From a orthopedic standpoint, she can be discharged to skilled nursing.  The transitional care team is working on placement.

## 2024-06-21 DIAGNOSIS — S065XAA Traumatic subdural hemorrhage with loss of consciousness status unknown, initial encounter: Secondary | ICD-10-CM | POA: Diagnosis not present

## 2024-06-21 NOTE — Progress Notes (Signed)
 [facility] PROGRESS NOTE    Progress Note  Courtney Avila  FMW:[id number] DOB: [birth year] DOA: 06/15/2024 PCP: [name], NP     Brief Narrative:   Courtney Avila is an 83 y.o. 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  Neurosurgery was consulted recommended conservative management. Assessment/Plan:   Subdural hematoma (HCC) 2 CT scan 8 hours apart showed stable subdural hematoma. Discussed with neurosurgery recommended no further treatment they recommended to initiate DVT prophylaxis for hip surgery on 06/17/2024. Follow-up with them as an outpatient. Medically stable, awaiting skilled nursing facility placement. Awaiting insurance authorization.  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 Narcotics and DVT prophylaxis per orthopedic doctor. Orthopedic surgery recommended to keep knee immobilizer on to keep the leg straight short-term. Aspirin  twice a day for DVT prophylaxis. Orthopedic surgery recommended a round of Bactrim .  Unwitnessed fall: Etiology unclear with a history of dementia history is limited. Evaluated by PT will need skilled nursing facility placement.  Postop acute blood loss anemia: Hemoglobin postoperatively dropped to 6.1. Transfused 2 units packed red blood cells. Hemoglobin this morning is 9.2.  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 Has been calm throughout her hospital stay no agitation.   Insomnia: Continue Restoril .  Anxiety and mood disorder: Continue BuSpar .  Oral intake: On D5.  Goals of care: She is currently DNR/DNI.  Concern for UTI: UA was unremarkable. Blood cultures  remain negative till date she completed a 3-day course of IV antibiotics in house.  1/2 blood cultures positive for staph epi: Likely contaminant.   DVT prophylaxis: scd Family Communication:none Status is: Inpatient Remains inpatient appropriate because: Right hip fracture    Code Status:     Code Status Orders  (From admission, onward)           Start     Ordered   06/16/24 0049  Do not attempt resuscitation (DNR)- Limited -Do Not Intubate (DNI)  Continuous       Question Answer Comment  If pulseless and not breathing No CPR or chest compressions.   In Pre-Arrest Conditions (Patient Is Breathing and Has A Pulse) Do not intubate. Provide all appropriate non-invasive medical interventions. Avoid ICU transfer unless indicated or required.   Consent: Discussion documented in EHR or advanced directives reviewed      06/16/24 0049           Code Status History     Date Active Date Inactive Code Status Order ID Comments User Context   01/24/2024 1529 06/15/2024 1835 DNR [id number]  [name], NP Outpatient   12/07/2016 1636 12/09/2016 1730 Full Code [id number]  Melodi Lerner, MD Inpatient   04/09/2016 1215 04/10/2016 2031 Full Code [id number]  Garland Sora, PA-C Inpatient         IV Access:   Peripheral IV   Procedures and diagnostic studies:   No results found.    Medical Consultants:   None.   Subjective:    Courtney Avila no complaints.  Objective:    Vitals:   06/20/24 0413 06/20/24 1340 06/20/24 1950 06/21/24 0446  BP: (!) 150/68 129/78 121/65 124/76  Pulse: 68 71 69 (!) 57  Resp: 18 18    Temp:  98.2 F (36.8 C) 99.1 F (37.3 C) 98.8 F (37.1 C) 97.7 F (36.5 C)  TempSrc: Oral Oral Oral Oral  SpO2: 96% 100% 98% 99%   SpO2: 99 % O2 Flow Rate (L/min): 3 L/min   Intake/Output Summary (Last 24 hours) at 06/21/2024 0931 Last data filed at 06/21/2024 0510 Gross per 24 hour  Intake --  Output 750 ml  Net -750 ml   There were  no vitals filed for this visit.  Exam: General exam: In no acute distress. Respiratory system: Good air movement and clear to auscultation. Cardiovascular system: S1 & S2 heard, RRR. No JVD. Gastrointestinal system: Abdomen is nondistended, soft and nontender.  Extremities: No pedal edema. Skin: No rashes, lesions or ulcers Psychiatry: Judgement and insight appear normal. Mood & affect appropriate.  Data Reviewed:    Labs: Basic Metabolic Panel: Recent Labs  Lab 06/15/24 2214 06/16/24 0918 06/16/24 2258 06/17/24 1318 06/18/24 0236  NA 138 139 137 137 139  K 4.6 4.0 4.8 4.5 4.0  CL 104 106 104 106 107  CO2 24 24 25 23 26   GLUCOSE 91 113* 158* 150* 147*  BUN 35* 22 20 21 19   CREATININE 0.72 0.58 0.67 0.74 0.63  CALCIUM 9.0 8.6* 8.4* 8.5* 8.3*  MG  --   --   --  2.0 2.1   GFR CrCl cannot be calculated (Unknown ideal weight.). Liver Function Tests: Recent Labs  Lab 06/15/24 2214 06/16/24 0918 06/16/24 2258 06/18/24 0236  AST 50* 50* 46* 95*  ALT 24 23 22 23   ALKPHOS 94 86 68 69  BILITOT 0.4 0.5 0.4 0.5  PROT 6.3* 5.9* 5.3* 5.0*  ALBUMIN  3.2* 3.0* 3.1* 2.7*   Recent Labs  Lab 06/15/24 2214  LIPASE 74*   No results for input(s): AMMONIA in the last 168 hours. Coagulation profile Recent Labs  Lab 06/16/24 0918  INR 1.1   COVID-19 Labs  No results for input(s): DDIMER, FERRITIN, LDH, CRP in the last 72 hours.  Lab Results  Component Value Date   SARSCOV2NAA NEGATIVE 06/15/2024   SARSCOV2NAA NEGATIVE 08/04/2022    CBC: Recent Labs  Lab 06/15/24 1942 06/16/24 0918 06/17/24 1318 06/17/24 1539 06/18/24 0236 06/19/24 1516  WBC 9.3 7.1 10.4 10.9* 8.8 9.9  NEUTROABS 7.5 5.2  --   --  6.4  --   HGB 10.5* 9.1* 6.1* 5.9* 9.2* 9.4*  HCT 33.0* 28.2* 18.9* 18.3* 27.3* 28.0*  MCV 95.7 94.9 94.5 95.8 92.9 92.7  PLT 320 335 355 341 298 388   Cardiac Enzymes: Recent Labs  Lab 06/15/24 2208 06/16/24 0918  CKTOTAL 481* 461*   BNP (last 3  results) No results for input(s): PROBNP in the last 8760 hours. CBG: No results for input(s): GLUCAP in the last 168 hours. D-Dimer: No results for input(s): DDIMER in the last 72 hours. Hgb A1c: No results for input(s): HGBA1C in the last 72 hours. Lipid Profile: No results for input(s): CHOL, HDL, LDLCALC, TRIG, CHOLHDL, LDLDIRECT in the last 72 hours. Thyroid  function studies: No results for input(s): TSH, T4TOTAL, T3FREE, THYROIDAB in the last 72 hours.  Invalid input(s): FREET3 Anemia work up: No results for input(s): VITAMINB12, FOLATE, FERRITIN, TIBC, IRON, RETICCTPCT in the last 72 hours. Sepsis Labs: Recent Labs  Lab 06/15/24 2018 06/16/24 0918 06/17/24 1318 06/17/24 1539 06/18/24 0236 06/19/24 1516  WBC  --    < > 10.4 10.9* 8.8 9.9  LATICACIDVEN 1.5  --   --   --   --   --    < > =  values in this interval not displayed.   Microbiology Recent Results (from the past 240 hours)  Blood culture (routine x 2)     Status: Abnormal   Collection Time: 06/15/24  8:10 PM   Specimen: BLOOD  Result Value Ref Range Status   Specimen Description   Final    BLOOD RIGHT ANTECUBITAL Performed at [facility], [address] [place], [place] [address]    Special Requests   Final    BOTTLES DRAWN AEROBIC AND ANAEROBIC Blood Culture adequate volume Performed at [facility], [address] [place], [place] [address]    Culture  Setup Time   Final    GRAM POSITIVE COCCI IN CLUSTERS IN BOTH AEROBIC AND ANAEROBIC BOTTLES CRITICAL RESULT CALLED TO, READ BACK BY AND VERIFIED WITH: PHARMD SYDNEY DAVIS [id number] AT 2050 BY EC    Culture (A)  Final    STAPHYLOCOCCUS HOMINIS STAPHYLOCOCCUS EPIDERMIDIS THE SIGNIFICANCE OF ISOLATING THIS ORGANISM FROM A SINGLE SET OF BLOOD CULTURES WHEN MULTIPLE SETS ARE DRAWN IS UNCERTAIN. PLEASE NOTIFY THE MICROBIOLOGY DEPARTMENT WITHIN ONE WEEK IF SPECIATION AND  SENSITIVITIES ARE REQUIRED. Performed at [facility], [address], [place], [place] [address]    Report Status 06/18/2024 FINAL  Final  Blood Culture ID Panel (Reflexed)     Status: Abnormal   Collection Time: 06/15/24  8:10 PM  Result Value Ref Range Status   Enterococcus faecalis NOT DETECTED NOT DETECTED Final   Enterococcus Faecium NOT DETECTED NOT DETECTED Final   Listeria monocytogenes NOT DETECTED NOT DETECTED Final   Staphylococcus species DETECTED (A) NOT DETECTED Final    Comment: CRITICAL RESULT CALLED TO, READ BACK BY AND VERIFIED WITH: PHARMD SYDNEY DAVIS [id number] AT 2050 BY EC    Staphylococcus aureus (BCID) NOT DETECTED NOT DETECTED Final   Staphylococcus epidermidis DETECTED (A) NOT DETECTED Final    Comment: Methicillin (oxacillin) resistant coagulase negative staphylococcus. Possible blood culture contaminant (unless isolated from more than one blood culture draw or clinical case suggests pathogenicity). No antibiotic treatment is indicated for blood  culture contaminants. REPEAT WITH NEW VIAL OF CTRL PHARMD SYDNEY DAVIS [id number] AT 2050 BY EC    Staphylococcus lugdunensis NOT DETECTED NOT DETECTED Final   Streptococcus species NOT DETECTED NOT DETECTED Final   Streptococcus agalactiae NOT DETECTED NOT DETECTED Final   Streptococcus pneumoniae NOT DETECTED NOT DETECTED Final   Streptococcus pyogenes NOT DETECTED NOT DETECTED Final   A.calcoaceticus-baumannii NOT DETECTED NOT DETECTED Final   Bacteroides fragilis NOT DETECTED NOT DETECTED Final   Enterobacterales NOT DETECTED NOT DETECTED Final   Enterobacter cloacae complex NOT DETECTED NOT DETECTED Final   Escherichia coli NOT DETECTED NOT DETECTED Final   Klebsiella aerogenes NOT DETECTED NOT DETECTED Final   Klebsiella oxytoca NOT DETECTED NOT DETECTED Final   Klebsiella pneumoniae NOT DETECTED NOT DETECTED Final   Proteus species NOT DETECTED NOT DETECTED Final   Salmonella species NOT DETECTED NOT  DETECTED Final   Serratia marcescens NOT DETECTED NOT DETECTED Final   Haemophilus influenzae NOT DETECTED NOT DETECTED Final   Neisseria meningitidis NOT DETECTED NOT DETECTED Final   Pseudomonas aeruginosa NOT DETECTED NOT DETECTED Final   Stenotrophomonas maltophilia NOT DETECTED NOT DETECTED Final   Candida albicans NOT DETECTED NOT DETECTED Final   Candida auris NOT DETECTED NOT DETECTED Final   Candida glabrata NOT DETECTED NOT DETECTED Final   Candida krusei NOT DETECTED NOT DETECTED Final   Candida parapsilosis NOT DETECTED NOT DETECTED Final  Candida tropicalis NOT DETECTED NOT DETECTED Final   Cryptococcus neoformans/gattii NOT DETECTED NOT DETECTED Final   Methicillin resistance mecA/C DETECTED (A) NOT DETECTED Final    Comment: CRITICAL RESULT CALLED TO, READ BACK BY AND VERIFIED WITH: PHARMD SYDNEY DAVIS [id number] AT 2050 BY EC Performed at [facility], [address], [place], [place] [address]   Resp panel by RT-PCR (RSV, Flu A&B, Covid) Anterior Nasal Swab     Status: None   Collection Time: 06/15/24 10:13 PM   Specimen: Anterior Nasal Swab  Result Value Ref Range Status   SARS Coronavirus 2 by RT PCR NEGATIVE NEGATIVE Final    Comment: (NOTE) SARS-CoV-2 target nucleic acids are NOT DETECTED.  The SARS-CoV-2 RNA is generally detectable in upper respiratory specimens during the acute phase of infection. The lowest concentration of SARS-CoV-2 viral copies this assay can detect is 138 copies/mL. A negative result does not preclude SARS-Cov-2 infection and should not be used as the sole basis for treatment or other patient management decisions. A negative result may occur with  improper specimen collection/handling, submission of specimen other than nasopharyngeal swab, presence of viral mutation(s) within the areas targeted by this assay, and inadequate number of viral copies(<138 copies/mL). A negative result must be combined with clinical observations,  patient history, and epidemiological information. The expected result is Negative.  Fact Sheet for Patients:  bloggercourse.com  Fact Sheet for Healthcare Providers:  seriousbroker.it  This test is no t yet approved or cleared by the [place]  FDA and  has been authorized for detection and/or diagnosis of SARS-CoV-2 by FDA under an Emergency Use Authorization (EUA). This EUA will remain  in effect (meaning this test can be used) for the duration of the COVID-19 declaration under Section 564(b)(1) of the Act, 21 U.S.C.section 360bbb-3(b)(1), unless the authorization is terminated  or revoked sooner.       Influenza A by PCR NEGATIVE NEGATIVE Final   Influenza B by PCR NEGATIVE NEGATIVE Final    Comment: (NOTE) The Xpert Xpress SARS-CoV-2/FLU/RSV plus assay is intended as an aid in the diagnosis of influenza from Nasopharyngeal swab specimens and should not be used as a sole basis for treatment. Nasal washings and aspirates are unacceptable for Xpert Xpress SARS-CoV-2/FLU/RSV testing.  Fact Sheet for Patients: bloggercourse.com  Fact Sheet for Healthcare Providers: seriousbroker.it  This test is not yet approved or cleared by the [place]  FDA and has been authorized for detection and/or diagnosis of SARS-CoV-2 by FDA under an Emergency Use Authorization (EUA). This EUA will remain in effect (meaning this test can be used) for the duration of the COVID-19 declaration under Section 564(b)(1) of the Act, 21 U.S.C. section 360bbb-3(b)(1), unless the authorization is terminated or revoked.     Resp Syncytial Virus by PCR NEGATIVE NEGATIVE Final    Comment: (NOTE) Fact Sheet for Patients: bloggercourse.com  Fact Sheet for Healthcare Providers: seriousbroker.it  This test is not yet approved or cleared by the [place]  [place] FDA and has been authorized for detection and/or diagnosis of SARS-CoV-2 by FDA under an Emergency Use Authorization (EUA). This EUA will remain in effect (meaning this test can be used) for the duration of the COVID-19 declaration under Section 564(b)(1) of the Act, 21 U.S.C. section 360bbb-3(b)(1), unless the authorization is terminated or revoked.  Performed at [facility], [address] [place], [place] [address]   Culture, blood (Routine X 2) w Reflex to ID Panel     Status: None (Preliminary  result)   Collection Time: 06/17/24  1:18 PM   Specimen: BLOOD  Result Value Ref Range Status   Specimen Description   Final    BLOOD SITE NOT SPECIFIED Performed at [facility], [address] [place], [place] [address]    Special Requests   Final    BOTTLES DRAWN AEROBIC ONLY Blood Culture results may not be optimal due to an inadequate volume of blood received in culture bottles Performed at [facility], [address], [place], [place] [address]    Culture   Final    NO GROWTH 4 DAYS Performed at [facility], [address], [place], [place] [address]    Report Status PENDING  Incomplete  Culture, blood (Routine X 2) w Reflex to ID Panel     Status: None (Preliminary result)   Collection Time: 06/17/24  1:24 PM   Specimen: BLOOD  Result Value Ref Range Status   Specimen Description   Final    BLOOD SITE NOT SPECIFIED Performed at [facility], [address] [place], [place] [address]    Special Requests   Final    BOTTLES DRAWN AEROBIC ONLY Blood Culture adequate volume Performed at [facility], [address], [place], [place] [address]    Culture   Final    NO GROWTH 4 DAYS Performed at [facility], [address] [place], [place] [address]    Report Status PENDING  Incomplete     Medications:    acetaminophen   1,000 mg Oral Q8H   aspirin   81 mg Oral BID   busPIRone    10 mg Oral BID   docusate sodium   100 mg Oral BID   PARoxetine   40 mg Oral QHS   sulfamethoxazole -trimethoprim   1 tablet Oral Q12H   temazepam   30 mg Oral QHS   Continuous Infusions:  dextrose  5% lactated ringers  75 mL/hr at 06/19/24 1947      LOS: 5 days   Erle Odell Castor  [facility]  06/21/2024, 9:31 AM

## 2024-06-21 NOTE — TOC Progression Note (Signed)
 Transition of Care [facility]) - Progression Note    Patient Details  Name: Courtney Avila MRN: [id number] Date of Birth: 1941/03/08  [facility] CM/SW Contact  Tawni CHRISTELLA Eva, LCSW Phone Number: 06/21/2024, 10:36 AM  Clinical Narrative:     Insurance auth still pending for [employer]. ICM to follow.  Expected Discharge Plan: Skilled Nursing Facility Barriers to Discharge: Continued Medical Work up, SNF Pending bed offer, Insurance Authorization               Expected Discharge Plan and Services       Living arrangements for the past 2 months: Single Family Home                                       Social Drivers of Health (SDOH) Interventions SDOH Screenings   Food Insecurity: No Food Insecurity (06/17/2024)  Housing: Unknown (06/17/2024)  Transportation Needs: No Transportation Needs (06/17/2024)  Utilities: Not At Risk (06/17/2024)  Depression (PHQ2-9): Low Risk (05/01/2024)  Social Connections: Socially Isolated (06/17/2024)  Tobacco Use: Low Risk (06/16/2024)    Readmission Risk Interventions     No data to display

## 2024-06-22 DIAGNOSIS — S065XAA Traumatic subdural hemorrhage with loss of consciousness status unknown, initial encounter: Secondary | ICD-10-CM | POA: Diagnosis not present

## 2024-06-22 LAB — CULTURE, BLOOD (ROUTINE X 2)
Culture: NO GROWTH
Culture: NO GROWTH
Special Requests: ADEQUATE

## 2024-06-22 MED ORDER — OXYCODONE HCL 5 MG PO TABS
5.0000 mg | ORAL_TABLET | ORAL | Status: DC | PRN
  Administered 2024-06-22 – 2024-06-26 (×13): 5 mg via ORAL
  Filled 2024-06-22 (×13): qty 1

## 2024-06-22 MED ORDER — MORPHINE SULFATE (PF) 2 MG/ML IV SOLN
2.0000 mg | INTRAVENOUS | Status: DC | PRN
  Administered 2024-06-22 – 2024-06-26 (×5): 2 mg via INTRAVENOUS
  Filled 2024-06-22 (×7): qty 1

## 2024-06-22 MED ORDER — SULFAMETHOXAZOLE-TRIMETHOPRIM 800-160 MG PO TABS
1.0000 | ORAL_TABLET | Freq: Two times a day (BID) | ORAL | Status: DC
  Administered 2024-06-22 – 2024-06-24 (×5): 1 via ORAL
  Filled 2024-06-22 (×5): qty 1

## 2024-06-22 NOTE — TOC Progression Note (Addendum)
 Transition of Care [facility]) - Progression Note   Patient Details  Name: Courtney Avila MRN: [id number] Date of Birth: 02-17-1941  Transition of [facility] CM/SW Contact  Duwaine GORMAN Aran, LCSW Phone Number: 06/22/2024, 9:57 AM  Clinical Narrative: CSW followed up with Tammy with [employer] regarding insurance authorization. Per Tammy, the request has been sent to medical review by the medical director. Care management awaiting insurance authorization.  Addendum: [employer] notified CSW that patient was approved for [facility]AR [phone]), but not SNF. Peer to peer information provided and is due today by 5pm. CSW provided information to hospitalist.  Expected Discharge Plan: Skilled Nursing Facility Barriers to Discharge: Continued Medical Work up, SNF Pending bed offer, [employer]  Expected Discharge Plan and Services Living arrangements for the past 2 months: Single Family Home  Social Drivers of Health (SDOH) Interventions SDOH Screenings   Food Insecurity: No Food Insecurity (06/17/2024)  Housing: Unknown (06/17/2024)  Transportation Needs: No Transportation Needs (06/17/2024)  Utilities: Not At Risk (06/17/2024)  Depression (PHQ2-9): Low Risk (05/01/2024)  Social Connections: Socially Isolated (06/17/2024)  Tobacco Use: Low Risk (06/16/2024)   Readmission Risk Interventions     No data to display

## 2024-06-22 NOTE — Plan of Care (Signed)
 Pt less agitated pain more controlled today still having a lot of pain with repositioning

## 2024-06-22 NOTE — Progress Notes (Signed)
 [facility] PROGRESS NOTE    Progress Note  Courtney Avila  FMW:[id number] DOB: [birth year] DOA: 06/15/2024 PCP: [name], NP     Brief Narrative:   Courtney Avila is an 83 y.o. 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  Neurosurgery was consulted recommended conservative management. Assessment/Plan:   Subdural hematoma (HCC) 2 CT scan 8 hours apart showed stable subdural hematoma. Discussed with neurosurgery recommended no further treatment they recommended to initiate DVT prophylaxis for hip surgery on 06/17/2024. Awaiting insurance authorization.  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 Narcotics and DVT prophylaxis per orthopedic doctor. Orthopedic surgery recommended to keep knee immobilizer on to keep the leg straight short-term. Aspirin  twice a day for DVT prophylaxis. Orthopedic surgery recommended a round of Bactrim . Start transitioning from IV narcotics to orals.  Unwitnessed fall: Etiology unclear with a history of dementia history is limited. Evaluated by PT will need skilled nursing facility placement.  Postop acute blood loss anemia: Hemoglobin postoperatively dropped to 6.1. Transfused 2 units packed red blood cells. Hemoglobin this morning is 9.2.  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 Has been calm throughout her hospital stay no agitation.   Insomnia: Continue Restoril .  Anxiety and mood disorder: Continue BuSpar .  Oral intake: On D5.  Goals of care: She is currently DNR/DNI.  Concern for UTI: UA was unremarkable. Blood cultures remain negative till date she completed a 3-day course  of IV antibiotics in house.  1/2 blood cultures positive for staph epi: Likely contaminant.   DVT prophylaxis: scd Family Communication:none Status is: Inpatient Remains inpatient appropriate because: Right hip fracture    Code Status:     Code Status Orders  (From admission, onward)           Start     Ordered   06/16/24 0049  Do not attempt resuscitation (DNR)- Limited -Do Not Intubate (DNI)  Continuous       Question Answer Comment  If pulseless and not breathing No CPR or chest compressions.   In Pre-Arrest Conditions (Patient Is Breathing and Has A Pulse) Do not intubate. Provide all appropriate non-invasive medical interventions. Avoid ICU transfer unless indicated or required.   Consent: Discussion documented in EHR or advanced directives reviewed      06/16/24 0049           Code Status History     Date Active Date Inactive Code Status Order ID Comments User Context   01/24/2024 1529 06/15/2024 1835 DNR [id number]  [name], NP Outpatient   12/07/2016 1636 12/09/2016 1730 Full Code [id number]  Melodi Lerner, MD Inpatient   04/09/2016 1215 04/10/2016 2031 Full Code [id number]  Garland Sora, PA-C Inpatient         IV Access:   Peripheral IV   Procedures and diagnostic studies:   No results found.    Medical Consultants:   None.   Subjective:    Chan A Graw no complaints  Objective:    Vitals:   06/21/24 2036 06/21/24 2251 06/22/24 0549 06/22/24 0550  BP: (!) 153/99  (!) 121/55   Pulse: 72  66   Resp: 16 (!) 22 16 18   Temp: 98.2 F (36.8 C)  97.8 F (36.6 C)   TempSrc: Oral  Oral   SpO2: 97%  98%    SpO2: 98 % O2 Flow Rate (L/min): 3 L/min   Intake/Output Summary (Last 24 hours) at 06/22/2024 0852 Last data filed at 06/22/2024 9394 Gross per 24 hour  Intake 460 ml  Output 550 ml  Net -90 ml   There were no vitals filed for this visit.  Exam: General exam: In no acute distress. Respiratory system: Good air  movement and clear to auscultation. Cardiovascular system: S1 & S2 heard, RRR. No JVD. Gastrointestinal system: Abdomen is nondistended, soft and nontender.  Extremities: No pedal edema. Skin: No rashes, lesions or ulcers Psychiatry: Judgement and insight appear normal. Mood & affect appropriate.  Data Reviewed:    Labs: Basic Metabolic Panel: Recent Labs  Lab 06/15/24 2214 06/16/24 0918 06/16/24 2258 06/17/24 1318 06/18/24 0236  NA 138 139 137 137 139  K 4.6 4.0 4.8 4.5 4.0  CL 104 106 104 106 107  CO2 24 24 25 23 26   GLUCOSE 91 113* 158* 150* 147*  BUN 35* 22 20 21 19   CREATININE 0.72 0.58 0.67 0.74 0.63  CALCIUM 9.0 8.6* 8.4* 8.5* 8.3*  MG  --   --   --  2.0 2.1   GFR CrCl cannot be calculated (Unknown ideal weight.). Liver Function Tests: Recent Labs  Lab 06/15/24 2214 06/16/24 0918 06/16/24 2258 06/18/24 0236  AST 50* 50* 46* 95*  ALT 24 23 22 23   ALKPHOS 94 86 68 69  BILITOT 0.4 0.5 0.4 0.5  PROT 6.3* 5.9* 5.3* 5.0*  ALBUMIN  3.2* 3.0* 3.1* 2.7*   Recent Labs  Lab 06/15/24 2214  LIPASE 74*   No results for input(s): AMMONIA in the last 168 hours. Coagulation profile Recent Labs  Lab 06/16/24 0918  INR 1.1   COVID-19 Labs  No results for input(s): DDIMER, FERRITIN, LDH, CRP in the last 72 hours.  Lab Results  Component Value Date   SARSCOV2NAA NEGATIVE 06/15/2024   SARSCOV2NAA NEGATIVE 08/04/2022    CBC: Recent Labs  Lab 06/15/24 1942 06/16/24 0918 06/17/24 1318 06/17/24 1539 06/18/24 0236 06/19/24 1516  WBC 9.3 7.1 10.4 10.9* 8.8 9.9  NEUTROABS 7.5 5.2  --   --  6.4  --   HGB 10.5* 9.1* 6.1* 5.9* 9.2* 9.4*  HCT 33.0* 28.2* 18.9* 18.3* 27.3* 28.0*  MCV 95.7 94.9 94.5 95.8 92.9 92.7  PLT 320 335 355 341 298 388   Cardiac Enzymes: Recent Labs  Lab 06/15/24 2208 06/16/24 0918  CKTOTAL 481* 461*   BNP (last 3 results) No results for input(s): PROBNP in the last 8760 hours. CBG: No results for input(s): GLUCAP  in the last 168 hours. D-Dimer: No results for input(s): DDIMER in the last 72 hours. Hgb A1c: No results for input(s): HGBA1C in the last 72 hours. Lipid Profile: No results for input(s): CHOL, HDL, LDLCALC, TRIG, CHOLHDL, LDLDIRECT in the last 72 hours. Thyroid  function studies: No results for input(s): TSH, T4TOTAL, T3FREE, THYROIDAB in the last 72 hours.  Invalid input(s): FREET3 Anemia work up: No results for input(s): VITAMINB12, FOLATE, FERRITIN, TIBC, IRON, RETICCTPCT in the last 72 hours. Sepsis Labs: Recent Labs  Lab 06/15/24 2018 06/16/24 0918 06/17/24 1318 06/17/24 1539 06/18/24 0236 06/19/24 1516  WBC  --    < > 10.4 10.9* 8.8 9.9  LATICACIDVEN 1.5  --   --   --   --   --    < > = values  in this interval not displayed.   Microbiology Recent Results (from the past 240 hours)  Blood culture (routine x 2)     Status: Abnormal   Collection Time: 06/15/24  8:10 PM   Specimen: BLOOD  Result Value Ref Range Status   Specimen Description   Final    BLOOD RIGHT ANTECUBITAL Performed at [facility], [address], [place], [place] [address]    Special Requests   Final    BOTTLES DRAWN AEROBIC AND ANAEROBIC Blood Culture adequate volume Performed at [facility], [address], [place], [place] [address]    Culture  Setup Time   Final    GRAM POSITIVE COCCI IN CLUSTERS IN BOTH AEROBIC AND ANAEROBIC BOTTLES CRITICAL RESULT CALLED TO, READ BACK BY AND VERIFIED WITH: PHARMD SYDNEY DAVIS [id number] AT 2050 BY EC    Culture (A)  Final    STAPHYLOCOCCUS HOMINIS STAPHYLOCOCCUS EPIDERMIDIS THE SIGNIFICANCE OF ISOLATING THIS ORGANISM FROM A SINGLE SET OF BLOOD CULTURES WHEN MULTIPLE SETS ARE DRAWN IS UNCERTAIN. PLEASE NOTIFY THE MICROBIOLOGY DEPARTMENT WITHIN ONE WEEK IF SPECIATION AND SENSITIVITIES ARE REQUIRED. Performed at [facility], [address] [place], [place] [address]    Report  Status 06/18/2024 FINAL  Final  Blood Culture ID Panel (Reflexed)     Status: Abnormal   Collection Time: 06/15/24  8:10 PM  Result Value Ref Range Status   Enterococcus faecalis NOT DETECTED NOT DETECTED Final   Enterococcus Faecium NOT DETECTED NOT DETECTED Final   Listeria monocytogenes NOT DETECTED NOT DETECTED Final   Staphylococcus species DETECTED (A) NOT DETECTED Final    Comment: CRITICAL RESULT CALLED TO, READ BACK BY AND VERIFIED WITH: PHARMD SYDNEY DAVIS [id number] AT 2050 BY EC    Staphylococcus aureus (BCID) NOT DETECTED NOT DETECTED Final   Staphylococcus epidermidis DETECTED (A) NOT DETECTED Final    Comment: Methicillin (oxacillin) resistant coagulase negative staphylococcus. Possible blood culture contaminant (unless isolated from more than one blood culture draw or clinical case suggests pathogenicity). No antibiotic treatment is indicated for blood  culture contaminants. REPEAT WITH NEW VIAL OF CTRL PHARMD SYDNEY DAVIS [id number] AT 2050 BY EC    Staphylococcus lugdunensis NOT DETECTED NOT DETECTED Final   Streptococcus species NOT DETECTED NOT DETECTED Final   Streptococcus agalactiae NOT DETECTED NOT DETECTED Final   Streptococcus pneumoniae NOT DETECTED NOT DETECTED Final   Streptococcus pyogenes NOT DETECTED NOT DETECTED Final   A.calcoaceticus-baumannii NOT DETECTED NOT DETECTED Final   Bacteroides fragilis NOT DETECTED NOT DETECTED Final   Enterobacterales NOT DETECTED NOT DETECTED Final   Enterobacter cloacae complex NOT DETECTED NOT DETECTED Final   Escherichia coli NOT DETECTED NOT DETECTED Final   Klebsiella aerogenes NOT DETECTED NOT DETECTED Final   Klebsiella oxytoca NOT DETECTED NOT DETECTED Final   Klebsiella pneumoniae NOT DETECTED NOT DETECTED Final   Proteus species NOT DETECTED NOT DETECTED Final   Salmonella species NOT DETECTED NOT DETECTED Final   Serratia marcescens NOT DETECTED NOT DETECTED Final   Haemophilus influenzae NOT DETECTED NOT  DETECTED Final   Neisseria meningitidis NOT DETECTED NOT DETECTED Final   Pseudomonas aeruginosa NOT DETECTED NOT DETECTED Final   Stenotrophomonas maltophilia NOT DETECTED NOT DETECTED Final   Candida albicans NOT DETECTED NOT DETECTED Final   Candida auris NOT DETECTED NOT DETECTED Final   Candida glabrata NOT DETECTED NOT DETECTED Final   Candida krusei NOT DETECTED NOT DETECTED Final   Candida parapsilosis NOT DETECTED NOT DETECTED Final  Candida tropicalis NOT DETECTED NOT DETECTED Final   Cryptococcus neoformans/gattii NOT DETECTED NOT DETECTED Final   Methicillin resistance mecA/C DETECTED (A) NOT DETECTED Final    Comment: CRITICAL RESULT CALLED TO, READ BACK BY AND VERIFIED WITH: PHARMD SYDNEY DAVIS [id number] AT 2050 BY EC Performed at [facility], [address], [place], [place] [address]   Resp panel by RT-PCR (RSV, Flu A&B, Covid) Anterior Nasal Swab     Status: None   Collection Time: 06/15/24 10:13 PM   Specimen: Anterior Nasal Swab  Result Value Ref Range Status   SARS Coronavirus 2 by RT PCR NEGATIVE NEGATIVE Final    Comment: (NOTE) SARS-CoV-2 target nucleic acids are NOT DETECTED.  The SARS-CoV-2 RNA is generally detectable in upper respiratory specimens during the acute phase of infection. The lowest concentration of SARS-CoV-2 viral copies this assay can detect is 138 copies/mL. A negative result does not preclude SARS-Cov-2 infection and should not be used as the sole basis for treatment or other patient management decisions. A negative result may occur with  improper specimen collection/handling, submission of specimen other than nasopharyngeal swab, presence of viral mutation(s) within the areas targeted by this assay, and inadequate number of viral copies(<138 copies/mL). A negative result must be combined with clinical observations, patient history, and epidemiological information. The expected result is Negative.  Fact Sheet for Patients:   bloggercourse.com  Fact Sheet for Healthcare Providers:  seriousbroker.it  This test is no t yet approved or cleared by the [place]  FDA and  has been authorized for detection and/or diagnosis of SARS-CoV-2 by FDA under an Emergency Use Authorization (EUA). This EUA will remain  in effect (meaning this test can be used) for the duration of the COVID-19 declaration under Section 564(b)(1) of the Act, 21 U.S.C.section 360bbb-3(b)(1), unless the authorization is terminated  or revoked sooner.       Influenza A by PCR NEGATIVE NEGATIVE Final   Influenza B by PCR NEGATIVE NEGATIVE Final    Comment: (NOTE) The Xpert Xpress SARS-CoV-2/FLU/RSV plus assay is intended as an aid in the diagnosis of influenza from Nasopharyngeal swab specimens and should not be used as a sole basis for treatment. Nasal washings and aspirates are unacceptable for Xpert Xpress SARS-CoV-2/FLU/RSV testing.  Fact Sheet for Patients: bloggercourse.com  Fact Sheet for Healthcare Providers: seriousbroker.it  This test is not yet approved or cleared by the [place]  FDA and has been authorized for detection and/or diagnosis of SARS-CoV-2 by FDA under an Emergency Use Authorization (EUA). This EUA will remain in effect (meaning this test can be used) for the duration of the COVID-19 declaration under Section 564(b)(1) of the Act, 21 U.S.C. section 360bbb-3(b)(1), unless the authorization is terminated or revoked.     Resp Syncytial Virus by PCR NEGATIVE NEGATIVE Final    Comment: (NOTE) Fact Sheet for Patients: bloggercourse.com  Fact Sheet for Healthcare Providers: seriousbroker.it  This test is not yet approved or cleared by the [place]  FDA and has been authorized for detection and/or diagnosis of SARS-CoV-2 by FDA under an Emergency Use  Authorization (EUA). This EUA will remain in effect (meaning this test can be used) for the duration of the COVID-19 declaration under Section 564(b)(1) of the Act, 21 U.S.C. section 360bbb-3(b)(1), unless the authorization is terminated or revoked.  Performed at [facility], [address] [place], [place] [address]   Culture, blood (Routine X 2) w Reflex to ID Panel     Status: None  Collection Time: 06/17/24  1:18 PM   Specimen: BLOOD  Result Value Ref Range Status   Specimen Description   Final    BLOOD SITE NOT SPECIFIED Performed at [facility], [address], [place], [place] [address]    Special Requests   Final    BOTTLES DRAWN AEROBIC ONLY Blood Culture results may not be optimal due to an inadequate volume of blood received in culture bottles Performed at [facility], [address], [place], [place] [address]    Culture   Final    NO GROWTH 5 DAYS Performed at [facility], [address] [place], [place] [address]    Report Status 06/22/2024 FINAL  Final  Culture, blood (Routine X 2) w Reflex to ID Panel     Status: None   Collection Time: 06/17/24  1:24 PM   Specimen: BLOOD  Result Value Ref Range Status   Specimen Description   Final    BLOOD SITE NOT SPECIFIED Performed at [facility], [address] [place], [place] [address]    Special Requests   Final    BOTTLES DRAWN AEROBIC ONLY Blood Culture adequate volume Performed at [facility], [address] [place], [place] [address]    Culture   Final    NO GROWTH 5 DAYS Performed at [facility], [address], [place], [place] [address]    Report Status 06/22/2024 FINAL  Final     Medications:    acetaminophen   1,000 mg Oral Q8H   aspirin   81 mg Oral BID   busPIRone   10 mg Oral BID   docusate sodium   100 mg Oral BID   PARoxetine   40 mg Oral QHS   sulfamethoxazole -trimethoprim   1 tablet Oral Q12H   temazepam    30 mg Oral QHS   Continuous Infusions:  dextrose  5% lactated ringers  75 mL/hr at 06/22/24 0051      LOS: 6 days   Erle Odell Castor  [facility]  06/22/2024, 8:52 AM

## 2024-06-23 DIAGNOSIS — S065XAA Traumatic subdural hemorrhage with loss of consciousness status unknown, initial encounter: Secondary | ICD-10-CM | POA: Diagnosis not present

## 2024-06-23 NOTE — Progress Notes (Signed)
 [facility] PROGRESS NOTE    Progress Note  Courtney Avila  FMW:[id number] DOB: [birth year] DOA: 06/15/2024 PCP: [name], NP     Brief Narrative:   Courtney Avila is an 83 y.o. 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  Neurosurgery was consulted recommended conservative management. Assessment/Plan:   Subdural hematoma (HCC) 2 CT scan 8 hours apart showed stable subdural hematoma. Discussed with neurosurgery recommended no further treatment they recommended to initiate DVT prophylaxis for hip surgery on 06/17/2024. Awaiting insurance authorization.  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 Narcotics and DVT prophylaxis per orthopedic doctor. Orthopedic surgery recommended to keep knee immobilizer on to keep the leg straight short-term. Aspirin  twice a day for DVT prophylaxis. Orthopedic surgery recommended a round of Bactrim . Start transitioning from IV narcotics to orals.  Unwitnessed fall: Etiology unclear with a history of dementia history is limited. Evaluated by PT will need skilled nursing facility placement.  Postop acute blood loss anemia: Hemoglobin postoperatively dropped to 6.1. Transfused 2 units packed red blood cells. Hemoglobin this morning is 9.2.  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 Has been calm throughout her hospital stay no agitation.   Insomnia: Continue Restoril .  Anxiety and mood disorder: Continue BuSpar .  Oral intake: On D5.  Goals of care: She is currently DNR/DNI.  Concern for UTI: UA was unremarkable. Blood cultures remain negative till date she completed a 3-day course  of IV antibiotics in house.  1/2 blood cultures positive for staph epi: Likely contaminant.   DVT prophylaxis: scd Family Communication:none Status is: Inpatient Remains inpatient appropriate because: Right hip fracture    Code Status:     Code Status Orders  (From admission, onward)           Start     Ordered   06/16/24 0049  Do not attempt resuscitation (DNR)- Limited -Do Not Intubate (DNI)  Continuous       Question Answer Comment  If pulseless and not breathing No CPR or chest compressions.   In Pre-Arrest Conditions (Patient Is Breathing and Has A Pulse) Do not intubate. Provide all appropriate non-invasive medical interventions. Avoid ICU transfer unless indicated or required.   Consent: Discussion documented in EHR or advanced directives reviewed      06/16/24 0049           Code Status History     Date Active Date Inactive Code Status Order ID Comments User Context   01/24/2024 1529 06/15/2024 1835 DNR [id number]  [name], NP Outpatient   12/07/2016 1636 12/09/2016 1730 Full Code [id number]  Melodi Lerner, MD Inpatient   04/09/2016 1215 04/10/2016 2031 Full Code [id number]  Garland Sora, PA-C Inpatient         IV Access:   Peripheral IV   Procedures and diagnostic studies:   No results found.    Medical Consultants:   None.   Subjective:    Courtney Avila no complaints  Objective:    Vitals:   06/22/24 0550 06/22/24 1329 06/22/24 2052 06/23/24 0606  BP:  (!) 115/52 117/69 126/60  Pulse:  67 69 64  Resp: 18 20 18 19   Temp:  98.7 F (37.1 C) 98.1 F (  36.7 C) 98.3 F (36.8 C)  TempSrc:  Axillary  Oral  SpO2:  99% 99% 98%   SpO2: 98 % O2 Flow Rate (L/min): 3 L/min   Intake/Output Summary (Last 24 hours) at 06/23/2024 1001 Last data filed at 06/23/2024 0600 Gross per 24 hour  Intake 1986.7 ml  Output 700 ml  Net 1286.7 ml   There were no vitals filed for this visit.  Exam: General exam: In no acute  distress. Respiratory system: Good air movement and clear to auscultation. Cardiovascular system: S1 & S2 heard, RRR. No JVD. Gastrointestinal system: Abdomen is nondistended, soft and nontender.  Extremities: No pedal edema. Skin: No rashes, lesions or ulcers Psychiatry: Judgement and insight appear normal. Mood & affect appropriate.  Data Reviewed:    Labs: Basic Metabolic Panel: Recent Labs  Lab 06/16/24 2258 06/17/24 1318 06/18/24 0236  NA 137 137 139  K 4.8 4.5 4.0  CL 104 106 107  CO2 25 23 26   GLUCOSE 158* 150* 147*  BUN 20 21 19   CREATININE 0.67 0.74 0.63  CALCIUM 8.4* 8.5* 8.3*  MG  --  2.0 2.1   GFR CrCl cannot be calculated (Unknown ideal weight.). Liver Function Tests: Recent Labs  Lab 06/16/24 2258 06/18/24 0236  AST 46* 95*  ALT 22 23  ALKPHOS 68 69  BILITOT 0.4 0.5  PROT 5.3* 5.0*  ALBUMIN  3.1* 2.7*   No results for input(s): LIPASE, AMYLASE in the last 168 hours.  No results for input(s): AMMONIA in the last 168 hours. Coagulation profile No results for input(s): INR, PROTIME in the last 168 hours.  COVID-19 Labs  No results for input(s): DDIMER, FERRITIN, LDH, CRP in the last 72 hours.  Lab Results  Component Value Date   SARSCOV2NAA NEGATIVE 06/15/2024   SARSCOV2NAA NEGATIVE 08/04/2022    CBC: Recent Labs  Lab 06/17/24 1318 06/17/24 1539 06/18/24 0236 06/19/24 1516  WBC 10.4 10.9* 8.8 9.9  NEUTROABS  --   --  6.4  --   HGB 6.1* 5.9* 9.2* 9.4*  HCT 18.9* 18.3* 27.3* 28.0*  MCV 94.5 95.8 92.9 92.7  PLT 355 341 298 388   Cardiac Enzymes: No results for input(s): CKTOTAL, CKMB, CKMBINDEX, TROPONINI in the last 168 hours.  BNP (last 3 results) No results for input(s): PROBNP in the last 8760 hours. CBG: No results for input(s): GLUCAP in the last 168 hours. D-Dimer: No results for input(s): DDIMER in the last 72 hours. Hgb A1c: No results for input(s): HGBA1C in the last 72 hours. Lipid  Profile: No results for input(s): CHOL, HDL, LDLCALC, TRIG, CHOLHDL, LDLDIRECT in the last 72 hours. Thyroid  function studies: No results for input(s): TSH, T4TOTAL, T3FREE, THYROIDAB in the last 72 hours.  Invalid input(s): FREET3 Anemia work up: No results for input(s): VITAMINB12, FOLATE, FERRITIN, TIBC, IRON, RETICCTPCT in the last 72 hours. Sepsis Labs: Recent Labs  Lab 06/17/24 1318 06/17/24 1539 06/18/24 0236 06/19/24 1516  WBC 10.4 10.9* 8.8 9.9   Microbiology Recent Results (from the past 240 hours)  Blood culture (routine x 2)     Status: Abnormal   Collection Time: 06/15/24  8:10 PM   Specimen: BLOOD  Result Value Ref Range Status   Specimen Description   Final    BLOOD RIGHT ANTECUBITAL Performed at [facility], [address] [place], [place] [address]    Special Requests   Final    BOTTLES DRAWN AEROBIC AND ANAEROBIC Blood Culture adequate volume Performed at [facility], [address], [place], [place] [address]    Culture  Setup Time   Final    GRAM POSITIVE COCCI IN CLUSTERS IN BOTH AEROBIC AND ANAEROBIC BOTTLES CRITICAL RESULT CALLED TO, READ BACK BY AND VERIFIED WITH: PHARMD SYDNEY DAVIS [id number] AT 2050 BY EC    Culture (A)  Final    STAPHYLOCOCCUS HOMINIS STAPHYLOCOCCUS EPIDERMIDIS THE SIGNIFICANCE OF ISOLATING THIS ORGANISM FROM A SINGLE SET OF BLOOD CULTURES WHEN MULTIPLE SETS ARE DRAWN IS UNCERTAIN. PLEASE NOTIFY THE MICROBIOLOGY DEPARTMENT WITHIN ONE WEEK IF SPECIATION AND SENSITIVITIES ARE REQUIRED. Performed at [facility], [address], [place], [place] [address]    Report Status 06/18/2024 FINAL  Final  Blood Culture ID Panel (Reflexed)     Status: Abnormal   Collection Time: 06/15/24  8:10 PM  Result Value Ref Range Status   Enterococcus faecalis NOT DETECTED NOT DETECTED Final   Enterococcus Faecium NOT DETECTED NOT DETECTED Final   Listeria monocytogenes  NOT DETECTED NOT DETECTED Final   Staphylococcus species DETECTED (A) NOT DETECTED Final    Comment: CRITICAL RESULT CALLED TO, READ BACK BY AND VERIFIED WITH: PHARMD SYDNEY DAVIS [id number] AT 2050 BY EC    Staphylococcus aureus (BCID) NOT DETECTED NOT DETECTED Final   Staphylococcus epidermidis DETECTED (A) NOT DETECTED Final    Comment: Methicillin (oxacillin) resistant coagulase negative staphylococcus. Possible blood culture contaminant (unless isolated from more than one blood culture draw or clinical case suggests pathogenicity). No antibiotic treatment is indicated for blood  culture contaminants. REPEAT WITH NEW VIAL OF CTRL PHARMD SYDNEY DAVIS [id number] AT 2050 BY EC    Staphylococcus lugdunensis NOT DETECTED NOT DETECTED Final   Streptococcus species NOT DETECTED NOT DETECTED Final   Streptococcus agalactiae NOT DETECTED NOT DETECTED Final   Streptococcus pneumoniae NOT DETECTED NOT DETECTED Final   Streptococcus pyogenes NOT DETECTED NOT DETECTED Final   A.calcoaceticus-baumannii NOT DETECTED NOT DETECTED Final   Bacteroides fragilis NOT DETECTED NOT DETECTED Final   Enterobacterales NOT DETECTED NOT DETECTED Final   Enterobacter cloacae complex NOT DETECTED NOT DETECTED Final   Escherichia coli NOT DETECTED NOT DETECTED Final   Klebsiella aerogenes NOT DETECTED NOT DETECTED Final   Klebsiella oxytoca NOT DETECTED NOT DETECTED Final   Klebsiella pneumoniae NOT DETECTED NOT DETECTED Final   Proteus species NOT DETECTED NOT DETECTED Final   Salmonella species NOT DETECTED NOT DETECTED Final   Serratia marcescens NOT DETECTED NOT DETECTED Final   Haemophilus influenzae NOT DETECTED NOT DETECTED Final   Neisseria meningitidis NOT DETECTED NOT DETECTED Final   Pseudomonas aeruginosa NOT DETECTED NOT DETECTED Final   Stenotrophomonas maltophilia NOT DETECTED NOT DETECTED Final   Candida albicans NOT DETECTED NOT DETECTED Final   Candida auris NOT DETECTED NOT DETECTED Final    Candida glabrata NOT DETECTED NOT DETECTED Final   Candida krusei NOT DETECTED NOT DETECTED Final   Candida parapsilosis NOT DETECTED NOT DETECTED Final   Candida tropicalis NOT DETECTED NOT DETECTED Final   Cryptococcus neoformans/gattii NOT DETECTED NOT DETECTED Final   Methicillin resistance mecA/C DETECTED (A) NOT DETECTED Final    Comment: CRITICAL RESULT CALLED TO, READ BACK BY AND VERIFIED WITH: PHARMD SYDNEY DAVIS [id number] AT 2050 BY EC Performed at [facility], [address] [place], [place] [address]   Resp panel by RT-PCR (RSV, Flu A&B, Covid) Anterior Nasal Swab     Status: None   Collection Time: 06/15/24 10:13 PM   Specimen: Anterior Nasal Swab  Result Value Ref Range Status   SARS Coronavirus 2 by RT PCR NEGATIVE NEGATIVE Final    Comment: (NOTE) SARS-CoV-2 target nucleic acids are NOT DETECTED.  The SARS-CoV-2 RNA is generally detectable in upper respiratory specimens during the acute phase of infection. The lowest concentration of SARS-CoV-2 viral copies this assay can detect is 138 copies/mL. A negative result does not preclude SARS-Cov-2 infection and should not be used as the sole basis for treatment or other patient management decisions. A negative result may occur with  improper specimen collection/handling, submission of specimen other than nasopharyngeal swab, presence of viral mutation(s) within the areas targeted by this assay, and inadequate number of viral copies(<138 copies/mL). A negative result must be combined with clinical observations, patient history, and epidemiological information. The expected result is Negative.  Fact Sheet for Patients:  bloggercourse.com  Fact Sheet for Healthcare Providers:  seriousbroker.it  This test is no t yet approved or cleared by the [place]  FDA and  has been authorized for detection and/or diagnosis of SARS-CoV-2 by FDA under an Emergency Use  Authorization (EUA). This EUA will remain  in effect (meaning this test can be used) for the duration of the COVID-19 declaration under Section 564(b)(1) of the Act, 21 U.S.C.section 360bbb-3(b)(1), unless the authorization is terminated  or revoked sooner.       Influenza A by PCR NEGATIVE NEGATIVE Final   Influenza B by PCR NEGATIVE NEGATIVE Final    Comment: (NOTE) The Xpert Xpress SARS-CoV-2/FLU/RSV plus assay is intended as an aid in the diagnosis of influenza from Nasopharyngeal swab specimens and should not be used as a sole basis for treatment. Nasal washings and aspirates are unacceptable for Xpert Xpress SARS-CoV-2/FLU/RSV testing.  Fact Sheet for Patients: bloggercourse.com  Fact Sheet for Healthcare Providers: seriousbroker.it  This test is not yet approved or cleared by the [place]  FDA and has been authorized for detection and/or diagnosis of SARS-CoV-2 by FDA under an Emergency Use Authorization (EUA). This EUA will remain in effect (meaning this test can be used) for the duration of the COVID-19 declaration under Section 564(b)(1) of the Act, 21 U.S.C. section 360bbb-3(b)(1), unless the authorization is terminated or revoked.     Resp Syncytial Virus by PCR NEGATIVE NEGATIVE Final    Comment: (NOTE) Fact Sheet for Patients: bloggercourse.com  Fact Sheet for Healthcare Providers: seriousbroker.it  This test is not yet approved or cleared by the [place]  FDA and has been authorized for detection and/or diagnosis of SARS-CoV-2 by FDA under an Emergency Use Authorization (EUA). This EUA will remain in effect (meaning this test can be used) for the duration of the COVID-19 declaration under Section 564(b)(1) of the Act, 21 U.S.C. section 360bbb-3(b)(1), unless the authorization is terminated or revoked.  Performed at [facility],  [address], [place], [place] [address]   Culture, blood (Routine X 2) w Reflex to ID Panel     Status: None   Collection Time: 06/17/24  1:18 PM   Specimen: BLOOD  Result Value Ref Range Status   Specimen Description   Final    BLOOD SITE NOT SPECIFIED Performed at [facility], [address] [place], [place] [address]    Special Requests   Final    BOTTLES DRAWN AEROBIC ONLY Blood Culture results may not be optimal due to an inadequate volume of blood received in culture bottles Performed at [facility], [address], [place], [place] [address]    Culture  Final    NO GROWTH 5 DAYS Performed at [facility], [address] [place], [place] [address]    Report Status 06/22/2024 FINAL  Final  Culture, blood (Routine X 2) w Reflex to ID Panel     Status: None   Collection Time: 06/17/24  1:24 PM   Specimen: BLOOD  Result Value Ref Range Status   Specimen Description   Final    BLOOD SITE NOT SPECIFIED Performed at [facility], [address], [place], [place] [address]    Special Requests   Final    BOTTLES DRAWN AEROBIC ONLY Blood Culture adequate volume Performed at [facility], [address], [place], [place] [address]    Culture   Final    NO GROWTH 5 DAYS Performed at [facility], [address] [place], [place] [address]    Report Status 06/22/2024 FINAL  Final     Medications:    acetaminophen   1,000 mg Oral Q8H   aspirin   81 mg Oral BID   busPIRone   10 mg Oral BID   docusate sodium   100 mg Oral BID   PARoxetine   40 mg Oral QHS   sulfamethoxazole -trimethoprim   1 tablet Oral Q12H   temazepam   30 mg Oral QHS   Continuous Infusions:  dextrose  5% lactated ringers  75 mL/hr at 06/22/24 1835      LOS: 7 days   Erle Odell Castor  [facility]  06/23/2024, 10:01 AM

## 2024-06-23 NOTE — TOC Progression Note (Signed)
 Transition of Care [facility]) - Progression Note   Patient Details  Name: Courtney Avila MRN: [id number] Date of Birth: 1941-04-24  [facility]) CM/SW Contact  Duwaine GORMAN Aran, LCSW Phone Number: 06/23/2024, 12:57 PM  Clinical Narrative: CSW spoke with Randine with [employer] and patient was denied for SNF. Denial letter faxed to CSW. CSW left two voicemails for son, [name] Cline, regarding the denial and family's option to appeal, but was unable to reach him. Care management to continue to attempt to follow up with son.  Expected Discharge Plan: Skilled Nursing Facility Barriers to Discharge: Continued Medical Work up, SNF Pending bed offer, Insurance Authorization  Expected Discharge Plan and Services Living arrangements for the past 2 months: Single Family Home  Social Drivers of Health (SDOH) Interventions SDOH Screenings   Food Insecurity: No Food Insecurity (06/17/2024)  Housing: Unknown (06/17/2024)  Transportation Needs: No Transportation Needs (06/17/2024)  Utilities: Not At Risk (06/17/2024)  Depression (PHQ2-9): Low Risk (05/01/2024)  Social Connections: Socially Isolated (06/17/2024)  Tobacco Use: Low Risk (06/16/2024)   Readmission Risk Interventions     No data to display

## 2024-06-24 DIAGNOSIS — S065XAA Traumatic subdural hemorrhage with loss of consciousness status unknown, initial encounter: Secondary | ICD-10-CM | POA: Diagnosis not present

## 2024-06-24 MED ORDER — DEXTROSE-SODIUM CHLORIDE 5-0.9 % IV SOLN: INTRAVENOUS | Status: AC

## 2024-06-24 NOTE — Progress Notes (Signed)
 [facility] PROGRESS NOTE    Progress Note  Courtney Avila  FMW:[id number] DOB: [birth year] DOA: 06/15/2024 PCP: [name], NP     Brief Narrative:   Courtney Avila is an 83 y.o. 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  Neurosurgery was consulted recommended conservative management. Assessment/Plan:   Subdural hematoma (HCC) 2 CT scan 8 hours apart showed stable subdural hematoma. Discussed with neurosurgery recommended no further treatment they recommended to initiate DVT prophylaxis for hip surgery on 06/17/2024. Denied placement by insurance.  Social worker talk with family to discuss options.  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 Narcotics and DVT prophylaxis per orthopedic doctor. Orthopedic surgery recommended to keep knee immobilizer on to keep the leg straight short-term. Aspirin  twice a day for DVT prophylaxis. Orthopedic surgery recommended a round of Bactrim  for 1 week. Start transitioning from IV narcotics to orals.  Unwitnessed fall: Etiology unclear with a history of dementia history is limited. Evaluated by PT will need skilled nursing facility placement.  Postop acute blood loss anemia: Hemoglobin postoperatively dropped to 6.1. Transfused 2 units packed red blood cells. Hemoglobin this morning is 9.2.  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 Has been calm throughout her hospital stay no agitation.   Insomnia: Continue Restoril .  Anxiety and mood disorder: Continue BuSpar .  Oral intake: On D5.  Goals of care: She is currently DNR/DNI.  Concern for UTI: UA was unremarkable. Blood  cultures remain negative till date she completed a 3-day course of IV antibiotics in house.  1/2 blood cultures positive for staph epi: Likely contaminant.   DVT prophylaxis: scd Family Communication:none Status is: Inpatient Remains inpatient appropriate because: Right hip fracture    Code Status:     Code Status Orders  (From admission, onward)           Start     Ordered   06/16/24 0049  Do not attempt resuscitation (DNR)- Limited -Do Not Intubate (DNI)  Continuous       Question Answer Comment  If pulseless and not breathing No CPR or chest compressions.   In Pre-Arrest Conditions (Patient Is Breathing and Has A Pulse) Do not intubate. Provide all appropriate non-invasive medical interventions. Avoid ICU transfer unless indicated or required.   Consent: Discussion documented in EHR or advanced directives reviewed      06/16/24 0049           Code Status History     Date Active Date Inactive Code Status Order ID Comments User Context   01/24/2024 1529 06/15/2024 1835 DNR [id number]  [name], NP Outpatient   12/07/2016 1636 12/09/2016 1730 Full Code [id number]  Melodi Lerner, MD Inpatient   04/09/2016 1215 04/10/2016 2031 Full Code [id number]  Garland Sora, PA-C Inpatient         IV Access:   Peripheral IV   Procedures and diagnostic studies:   No results found.    Medical Consultants:   None.   Subjective:    Courtney Avila no complaints  Objective:    Vitals:   06/23/24 0606 06/23/24 1316 06/23/24 2136 06/24/24 0545  BP: 126/60 (!) 157/84 115/60 138/71  Pulse: 64 94 93 66  Resp:  19 (!) 21 20 19   Temp: 98.3 F (36.8 C) (!) 97.5 F (36.4 C) 98 F (36.7 C) 98.6 F (37 C)  TempSrc: Oral Oral  Oral  SpO2: 98% 98% 95% 97%   SpO2: 97 % O2 Flow Rate (L/min): 3 L/min   Intake/Output Summary (Last 24 hours) at 06/24/2024 9095 Last data filed at 06/24/2024 0600 Gross per 24 hour  Intake 420 ml  Output 1150 ml  Net -730 ml    There were no vitals filed for this visit.  Exam: General exam: In no acute distress. Respiratory system: Good air movement and clear to auscultation. Cardiovascular system: S1 & S2 heard, RRR. No JVD. Gastrointestinal system: Abdomen is nondistended, soft and nontender.  Extremities: No pedal edema. Skin: No rashes, lesions or ulcers Psychiatry: Judgement and insight appear normal. Mood & affect appropriate.  Data Reviewed:    Labs: Basic Metabolic Panel: Recent Labs  Lab 06/17/24 1318 06/18/24 0236  NA 137 139  K 4.5 4.0  CL 106 107  CO2 23 26  GLUCOSE 150* 147*  BUN 21 19  CREATININE 0.74 0.63  CALCIUM 8.5* 8.3*  MG 2.0 2.1   GFR CrCl cannot be calculated (Unknown ideal weight.). Liver Function Tests: Recent Labs  Lab 06/18/24 0236  AST 95*  ALT 23  ALKPHOS 69  BILITOT 0.5  PROT 5.0*  ALBUMIN  2.7*   No results for input(s): LIPASE, AMYLASE in the last 168 hours.  No results for input(s): AMMONIA in the last 168 hours. Coagulation profile No results for input(s): INR, PROTIME in the last 168 hours.  COVID-19 Labs  No results for input(s): DDIMER, FERRITIN, LDH, CRP in the last 72 hours.  Lab Results  Component Value Date   SARSCOV2NAA NEGATIVE 06/15/2024   SARSCOV2NAA NEGATIVE 08/04/2022    CBC: Recent Labs  Lab 06/17/24 1318 06/17/24 1539 06/18/24 0236 06/19/24 1516  WBC 10.4 10.9* 8.8 9.9  NEUTROABS  --   --  6.4  --   HGB 6.1* 5.9* 9.2* 9.4*  HCT 18.9* 18.3* 27.3* 28.0*  MCV 94.5 95.8 92.9 92.7  PLT 355 341 298 388   Cardiac Enzymes: No results for input(s): CKTOTAL, CKMB, CKMBINDEX, TROPONINI in the last 168 hours.  BNP (last 3 results) No results for input(s): PROBNP in the last 8760 hours. CBG: No results for input(s): GLUCAP in the last 168 hours. D-Dimer: No results for input(s): DDIMER in the last 72 hours. Hgb A1c: No results for input(s): HGBA1C in the last 72 hours. Lipid  Profile: No results for input(s): CHOL, HDL, LDLCALC, TRIG, CHOLHDL, LDLDIRECT in the last 72 hours. Thyroid  function studies: No results for input(s): TSH, T4TOTAL, T3FREE, THYROIDAB in the last 72 hours.  Invalid input(s): FREET3 Anemia work up: No results for input(s): VITAMINB12, FOLATE, FERRITIN, TIBC, IRON, RETICCTPCT in the last 72 hours. Sepsis Labs: Recent Labs  Lab 06/17/24 1318 06/17/24 1539 06/18/24 0236 06/19/24 1516  WBC 10.4 10.9* 8.8 9.9   Microbiology Recent Results (from the past 240 hours)  Blood culture (routine x 2)     Status: Abnormal   Collection Time: 06/15/24  8:10 PM   Specimen: BLOOD  Result Value Ref Range Status   Specimen Description   Final    BLOOD RIGHT ANTECUBITAL Performed at [facility], [address], [place], [place] [address]    Special Requests   Final    BOTTLES DRAWN AEROBIC AND ANAEROBIC Blood Culture adequate volume Performed at [facility], [address] [place], [place] [address]    Culture  Setup Time   Final    GRAM POSITIVE COCCI IN CLUSTERS IN BOTH AEROBIC AND ANAEROBIC BOTTLES CRITICAL RESULT CALLED TO, READ BACK BY AND VERIFIED WITH: PHARMD SYDNEY DAVIS [phone] AT 2050 BY EC    Culture (A)  Final    STAPHYLOCOCCUS HOMINIS STAPHYLOCOCCUS EPIDERMIDIS THE SIGNIFICANCE OF ISOLATING THIS ORGANISM FROM A SINGLE SET OF BLOOD CULTURES WHEN MULTIPLE SETS ARE DRAWN IS UNCERTAIN. PLEASE NOTIFY THE MICROBIOLOGY DEPARTMENT WITHIN ONE WEEK IF SPECIATION AND SENSITIVITIES ARE REQUIRED. Performed at [facility], [address], [place], [place] [address]    Report Status 06/18/2024 FINAL  Final  Blood Culture ID Panel (Reflexed)     Status: Abnormal   Collection Time: 06/15/24  8:10 PM  Result Value Ref Range Status   Enterococcus faecalis NOT DETECTED NOT DETECTED Final   Enterococcus Faecium NOT DETECTED NOT DETECTED Final   Listeria monocytogenes  NOT DETECTED NOT DETECTED Final   Staphylococcus species DETECTED (A) NOT DETECTED Final    Comment: CRITICAL RESULT CALLED TO, READ BACK BY AND VERIFIED WITH: PHARMD SYDNEY DAVIS [id number] AT 2050 BY EC    Staphylococcus aureus (BCID) NOT DETECTED NOT DETECTED Final   Staphylococcus epidermidis DETECTED (A) NOT DETECTED Final    Comment: Methicillin (oxacillin) resistant coagulase negative staphylococcus. Possible blood culture contaminant (unless isolated from more than one blood culture draw or clinical case suggests pathogenicity). No antibiotic treatment is indicated for blood  culture contaminants. REPEAT WITH NEW VIAL OF CTRL PHARMD SYDNEY DAVIS [id number] AT 2050 BY EC    Staphylococcus lugdunensis NOT DETECTED NOT DETECTED Final   Streptococcus species NOT DETECTED NOT DETECTED Final   Streptococcus agalactiae NOT DETECTED NOT DETECTED Final   Streptococcus pneumoniae NOT DETECTED NOT DETECTED Final   Streptococcus pyogenes NOT DETECTED NOT DETECTED Final   A.calcoaceticus-baumannii NOT DETECTED NOT DETECTED Final   Bacteroides fragilis NOT DETECTED NOT DETECTED Final   Enterobacterales NOT DETECTED NOT DETECTED Final   Enterobacter cloacae complex NOT DETECTED NOT DETECTED Final   Escherichia coli NOT DETECTED NOT DETECTED Final   Klebsiella aerogenes NOT DETECTED NOT DETECTED Final   Klebsiella oxytoca NOT DETECTED NOT DETECTED Final   Klebsiella pneumoniae NOT DETECTED NOT DETECTED Final   Proteus species NOT DETECTED NOT DETECTED Final   Salmonella species NOT DETECTED NOT DETECTED Final   Serratia marcescens NOT DETECTED NOT DETECTED Final   Haemophilus influenzae NOT DETECTED NOT DETECTED Final   Neisseria meningitidis NOT DETECTED NOT DETECTED Final   Pseudomonas aeruginosa NOT DETECTED NOT DETECTED Final   Stenotrophomonas maltophilia NOT DETECTED NOT DETECTED Final   Candida albicans NOT DETECTED NOT DETECTED Final   Candida auris NOT DETECTED NOT DETECTED Final    Candida glabrata NOT DETECTED NOT DETECTED Final   Candida krusei NOT DETECTED NOT DETECTED Final   Candida parapsilosis NOT DETECTED NOT DETECTED Final   Candida tropicalis NOT DETECTED NOT DETECTED Final   Cryptococcus neoformans/gattii NOT DETECTED NOT DETECTED Final   Methicillin resistance mecA/C DETECTED (A) NOT DETECTED Final    Comment: CRITICAL RESULT CALLED TO, READ BACK BY AND VERIFIED WITH: PHARMD SYDNEY DAVIS [phone] AT 2050 BY EC Performed at [facility], [address] [place], [place] [address]   Resp panel by RT-PCR (RSV, Flu A&B, Covid) Anterior Nasal Swab     Status: None   Collection Time: 06/15/24 10:13 PM   Specimen: Anterior Nasal Swab  Result Value Ref  Range Status   SARS Coronavirus 2 by RT PCR NEGATIVE NEGATIVE Final    Comment: (NOTE) SARS-CoV-2 target nucleic acids are NOT DETECTED.  The SARS-CoV-2 RNA is generally detectable in upper respiratory specimens during the acute phase of infection. The lowest concentration of SARS-CoV-2 viral copies this assay can detect is 138 copies/mL. A negative result does not preclude SARS-Cov-2 infection and should not be used as the sole basis for treatment or other patient management decisions. A negative result may occur with  improper specimen collection/handling, submission of specimen other than nasopharyngeal swab, presence of viral mutation(s) within the areas targeted by this assay, and inadequate number of viral copies(<138 copies/mL). A negative result must be combined with clinical observations, patient history, and epidemiological information. The expected result is Negative.  Fact Sheet for Patients:  bloggercourse.com  Fact Sheet for Healthcare Providers:  seriousbroker.it  This test is no t yet approved or cleared by the [place]  FDA and  has been authorized for detection and/or diagnosis of SARS-CoV-2 by FDA under an Emergency Use  Authorization (EUA). This EUA will remain  in effect (meaning this test can be used) for the duration of the COVID-19 declaration under Section 564(b)(1) of the Act, 21 U.S.C.section 360bbb-3(b)(1), unless the authorization is terminated  or revoked sooner.       Influenza A by PCR NEGATIVE NEGATIVE Final   Influenza B by PCR NEGATIVE NEGATIVE Final    Comment: (NOTE) The Xpert Xpress SARS-CoV-2/FLU/RSV plus assay is intended as an aid in the diagnosis of influenza from Nasopharyngeal swab specimens and should not be used as a sole basis for treatment. Nasal washings and aspirates are unacceptable for Xpert Xpress SARS-CoV-2/FLU/RSV testing.  Fact Sheet for Patients: bloggercourse.com  Fact Sheet for Healthcare Providers: seriousbroker.it  This test is not yet approved or cleared by the [place]  FDA and has been authorized for detection and/or diagnosis of SARS-CoV-2 by FDA under an Emergency Use Authorization (EUA). This EUA will remain in effect (meaning this test can be used) for the duration of the COVID-19 declaration under Section 564(b)(1) of the Act, 21 U.S.C. section 360bbb-3(b)(1), unless the authorization is terminated or revoked.     Resp Syncytial Virus by PCR NEGATIVE NEGATIVE Final    Comment: (NOTE) Fact Sheet for Patients: bloggercourse.com  Fact Sheet for Healthcare Providers: seriousbroker.it  This test is not yet approved or cleared by the [place]  FDA and has been authorized for detection and/or diagnosis of SARS-CoV-2 by FDA under an Emergency Use Authorization (EUA). This EUA will remain in effect (meaning this test can be used) for the duration of the COVID-19 declaration under Section 564(b)(1) of the Act, 21 U.S.C. section 360bbb-3(b)(1), unless the authorization is terminated or revoked.  Performed at [facility],  [address] [place], [place] [address]   Culture, blood (Routine X 2) w Reflex to ID Panel     Status: None   Collection Time: 06/17/24  1:18 PM   Specimen: BLOOD  Result Value Ref Range Status   Specimen Description   Final    BLOOD SITE NOT SPECIFIED Performed at [facility], [address], [place], [place] [address]    Special Requests   Final    BOTTLES DRAWN AEROBIC ONLY Blood Culture results may not be optimal due to an inadequate volume of blood received in culture bottles Performed at [facility], [address] [place], [place] [address]    Culture   Final  NO GROWTH 5 DAYS Performed at [facility], [address], [place], [place] [address]    Report Status 06/22/2024 FINAL  Final  Culture, blood (Routine X 2) w Reflex to ID Panel     Status: None   Collection Time: 06/17/24  1:24 PM   Specimen: BLOOD  Result Value Ref Range Status   Specimen Description   Final    BLOOD SITE NOT SPECIFIED Performed at [facility], [address], [place], [place] [address]    Special Requests   Final    BOTTLES DRAWN AEROBIC ONLY Blood Culture adequate volume Performed at [facility], [address], [place], [place] [address]    Culture   Final    NO GROWTH 5 DAYS Performed at [facility], [address] [place], [place] [address]    Report Status 06/22/2024 FINAL  Final     Medications:    acetaminophen   1,000 mg Oral Q8H   aspirin   81 mg Oral BID   busPIRone   10 mg Oral BID   docusate sodium   100 mg Oral BID   PARoxetine   40 mg Oral QHS   sulfamethoxazole -trimethoprim   1 tablet Oral Q12H   temazepam   30 mg Oral QHS   Continuous Infusions:  dextrose  5% lactated ringers  40 mL/hr at 06/23/24 1045      LOS: 8 days   Erle Odell Castor  [facility]  06/24/2024, 9:04 AM

## 2024-06-24 NOTE — Progress Notes (Signed)
 Patient ID: Courtney Avila, female   DOB: August 19, 1940, 83 y.o.   MRN: [id number] The patient is awake and alert and at her baseline dementia.  She has not been up or walk this week.  This is likely secondary to her dementia combined with her injury and subdural bleed.  Her vital signs are stable.  I did change the dressing at her right hip incision and the incision is clean and dry.  There is no redness at all.  Both knees are in a flexed and contracted position and likely she cannot really mobilize or walk given her current condition.  Skilled nursing placement is pending.  It is reasonable to stop antibiotics at this standpoint.

## 2024-06-24 NOTE — Progress Notes (Signed)
 Physical Therapy Treatment Patient Details Name: Courtney Avila MRN: [id number] DOB: Oct 24, 1940 Today's Date: 06/24/2024   History of Present Illness 83 y.o. female was brought by EMS from home with concern for possible UTI by family. Patient apparently has had generalized pain since she had a fall 2 days ago. She is also having pelvic pain that the family thought was possibly UTI. She is normally ambulatory and can communicate despite advanced dementia. She had a fall 2 prior to admission and since that she has been less ambulatory. on Evaluation in ED pt was determined to have a 7 mm subdural hematoma,  right intertrochanteric fracture,  and and L1 compression fracture all presumed to be from the fall. Orthopedics and neurosurgery were both consulted PMH: Alzheimer's dementia, osteoarthritis, GERD, anxiety disorder, hypertension   was brought by EMS from home with concern for possible UTI by family.   Orthopedics and neurosurgery were both consulted. pt is s/p R DA THA    PT Comments  Pt in bed, agreeable to session. Pt is significantly contracted at the knees and hips, worked towards low amplitude end range knee flexion extension and hip abd L to assist with alignment and pressure distribution. Trunk position altered with bed functions to assist with positioning but unable to remain in static stretch position. Nursing present and sacral dressing changed. Pt requires max A to roll side to side with initial pain noted and pt moaning, but able to resolve with <2 mins in position and rests comfortably. Similar response on 2nd rolling attempt, able to provide pillow support and head support. Nursing remains in room following. Based on pt PLOF, level of support, and current functional status, pt will benefit from skilled inpatient physical therapy <3 hours per day at dc for safe progression of functional mobility.     If plan is discharge home, recommend the following: Two people to help with  bathing/dressing/bathroom;Help with stairs or ramp for entrance;Assist for transportation;Two people to help with walking and/or transfers   Can travel by private vehicle        Equipment Recommendations  Other (comment) (TBD at next venue of care)    Recommendations for Other Services       Precautions / Restrictions Precautions Precautions: Fall Precaution/Restrictions Comments: KI DCed Restrictions Weight Bearing Restrictions Per Provider Order: Yes RLE Weight Bearing Per Provider Order: Weight bearing as tolerated     Mobility  Bed Mobility Overal bed mobility: Needs Assistance Bed Mobility: Rolling Rolling: Max assist, +2 for physical assistance, +2 for safety/equipment         General bed mobility comments: pads used to assist rolling and mitigate pressure on R hip for task    Transfers                   General transfer comment: NT 2* pain, anxiety, poor sitting balance    Ambulation/Gait                   Stairs             Wheelchair Mobility     Tilt Bed    Modified Rankin (Stroke Patients Only)       Balance       Sitting balance - Comments: did not progress to sitting position this session  Communication Communication Communication: Impaired Factors Affecting Communication: Reduced clarity of speech  Cognition Arousal: Alert Behavior During Therapy: Anxious   PT - Cognitive impairments: History of cognitive impairments, No family/caregiver present to determine baseline                         Following commands: Impaired Following commands impaired: Follows one step commands inconsistently    Cueing Cueing Techniques: Verbal cues, Tactile cues, Gestural cues  Exercises      General Comments General comments (skin integrity, edema, etc.): sacral dressing changed by nursing in session      Pertinent Vitals/Pain Pain Assessment Pain Assessment:  Faces Faces Pain Scale: Hurts whole lot Pain Location: L knee pain with attempted movement/rolling Pain Descriptors / Indicators: Grimacing, Guarding, Discomfort, Moaning Pain Intervention(s): Limited activity within patient's tolerance, Monitored during session, Repositioned    Home Living                          Prior Function            PT Goals (current goals can now be found in the care plan section) Acute Rehab PT Goals PT Goal Formulation: Patient unable to participate in goal setting Time For Goal Achievement: 07/01/24 Potential to Achieve Goals: Poor Progress towards PT goals: Progressing toward goals    Frequency    Min 2X/week      PT Plan      Co-evaluation              AM-PAC PT 6 Clicks Mobility   Outcome Measure  Help needed turning from your back to your side while in a flat bed without using bedrails?: Total Help needed moving from lying on your back to sitting on the side of a flat bed without using bedrails?: Total Help needed moving to and from a bed to a chair (including a wheelchair)?: Total Help needed standing up from a chair using your arms (e.g., wheelchair or bedside chair)?: Total Help needed to walk in hospital room?: Total Help needed climbing 3-5 steps with a railing? : Total 6 Click Score: 6    End of Session   Activity Tolerance: Patient limited by pain;Other (comment) (anxiety) Patient left: in bed;with bed alarm set;with call bell/phone within reach;with nursing/sitter in room Nurse Communication: Mobility status PT Visit Diagnosis: Other abnormalities of gait and mobility (R26.89);Pain;Difficulty in walking, not elsewhere classified (R26.2);Adult, failure to thrive (R62.7) Pain - Right/Left: Left Pain - part of body: Knee     Time: 8293-8265 PT Time Calculation (min) (ACUTE ONLY): 28 min  Charges:    $Therapeutic Activity: 23-37 mins PT General Charges $$ ACUTE PT VISIT: 1 Visit                      Courtney Avila, PT Acute Rehabilitation Services Office: [phone] 06/24/2024    Courtney Avila Courtney Avila 06/24/2024, 5:55 PM

## 2024-06-25 DIAGNOSIS — S065XAA Traumatic subdural hemorrhage with loss of consciousness status unknown, initial encounter: Secondary | ICD-10-CM | POA: Diagnosis not present

## 2024-06-25 NOTE — Plan of Care (Signed)

## 2024-06-25 NOTE — Progress Notes (Signed)
 " PROGRESS NOTE    Courtney Avila  FMW:[id number] DOB: [birth year] DOA: 06/15/2024 PCP: Courtney Harlene POUR, NP     Brief Narrative:  Courtney Avila is an 83 y.o. 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  Neurosurgery was consulted recommended conservative management.  New events last 24 hours / Subjective: Patient pleasant in demeanor.  Dementia at baseline.  No acute events reported.  Assessment & Plan:   Principal Problem:   Subdural hematoma (HCC) Active Problems:   OA (osteoarthritis) of knee   Dementia (HCC)   GERD (gastroesophageal reflux disease)   Closed 2-part intertrochanteric fracture of proximal end of femur with delayed healing, right   Closed compression fracture of body of L1 vertebra (HCC)   Displaced fracture of base of neck of right femur, initial encounter for closed fracture (HCC)   Subdural hematoma - Repeat CT scan showed stable subdural hematoma.  Neurosurgery has recommended no further treatment.  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 (Active)      Wound 06/18/24 1857 Pressure Injury Heel Right Stage 1 -  Intact skin with non-blanchable redness of a localized area usually over a bony prominence. (Active)    DVT prophylaxis:  SCDs Start: 06/16/24 2152 SCDs Start: 06/16/24 0734  Code Status: DNR Family Communication: None at bedside Disposition Plan: SNF placement, insurance shara is a barrier Status is: Inpatient Remains inpatient appropriate because: Placement pending    Antimicrobials:  Anti-infectives (From admission, onward)    Start     Dose/Rate Route Frequency Ordered Stop   06/22/24 1000  sulfamethoxazole -trimethoprim  (BACTRIM  DS) 800-160 MG per tablet 1 tablet  Status:  Discontinued        1 tablet Oral Every 12 hours 06/22/24 0859 06/24/24 1147   06/19/24 2200  sulfamethoxazole -trimethoprim  (BACTRIM  DS) 800-160 MG per tablet 1 tablet  Status:  Discontinued        1 tablet Oral Every 12 hours 06/19/24 1706 06/22/24 0859   06/19/24 0000  sulfamethoxazole -trimethoprim  (BACTRIM  DS) 800-160 MG tablet  Status:  Discontinued        1 tablet Oral Every 12 hours 06/19/24 1707 06/24/24    06/16/24 2245  ceFAZolin  (ANCEF ) IVPB 2g/100 mL premix        2 g 200 mL/hr over 30 Minutes Intravenous Every 6 hours 06/16/24 2152 06/17/24 1406   06/16/24 1245  ceFAZolin  (ANCEF ) IVPB 2g/100 mL premix        2 g 200 mL/hr over 30 Minutes Intravenous On call to O.R. 06/16/24 1230 06/16/24 1450   06/16/24 1231  ceFAZolin  (ANCEF ) 2-4 GM/100ML-% IVPB       Note to  Pharmacy: [name] D: cabinet override      06/16/24 1231 06/16/24 1458   06/16/24 1000  cefTRIAXone  (ROCEPHIN ) 1 g in sodium chloride  0.9 % 100 mL IVPB        1 g 200 mL/hr over 30 Minutes Intravenous Every 24 hours 06/16/24 0938 06/18/24 1238        Objective: Vitals:   06/24/24 0545 06/24/24 1303 06/24/24 2029 06/25/24 0318  BP: 138/71 (!) 145/88 (!) 124/99 (!) 117/54  Pulse: 66 78 71 62  Resp: 19 20 17 18   Temp: 98.6 F (37 C) (!) 97.5 F (36.4 C) 98.6 F  (37 C) 98.4 F (36.9 C)  TempSrc: Oral Oral Oral Oral  SpO2: 97% 98% 97% 97%    Intake/Output Summary (Last 24 hours) at 06/25/2024 1411 Last data filed at 06/25/2024 0900 Gross per 24 hour  Intake 495.2 ml  Output 600 ml  Net -104.8 ml   There were no vitals filed for this visit.  Examination:  General exam: Appears calm and comfortable  Respiratory system: Clear to auscultation. Respiratory effort normal. No respiratory distress. No conversational dyspnea.  Cardiovascular system: S1 & S2 heard, RRR. + murmur. No pedal edema. Gastrointestinal system: Abdomen is nondistended, soft and nontender. Normal bowel sounds heard. Central nervous system: Alert Extremities: Symmetric in appearance  Skin: No rashes, lesions or ulcers on exposed skin   Data Reviewed: I have personally reviewed following labs and imaging studies  CBC: Recent Labs  Lab 06/19/24 1516  WBC 9.9  HGB 9.4*  HCT 28.0*  MCV 92.7  PLT 388   Basic Metabolic Panel: No results for input(s): NA, K, CL, CO2, GLUCOSE, BUN, CREATININE, CALCIUM, MG, PHOS in the last 168 hours. GFR: CrCl cannot be calculated (Unknown ideal weight.). Liver Function Tests: No results for input(s): AST, ALT, ALKPHOS, BILITOT, PROT, ALBUMIN  in the last 168 hours. No results for input(s): LIPASE, AMYLASE in the last 168 hours. No results for input(s): AMMONIA in the last 168 hours. Coagulation Profile: No results for input(s): INR, PROTIME in the last 168 hours. Cardiac Enzymes: No results for input(s): CKTOTAL, CKMB, CKMBINDEX, TROPONINI in the last 168 hours. BNP (last 3 results) No results for input(s): PROBNP in the last 8760 hours. HbA1C: No results for input(s): HGBA1C in the last 72 hours. CBG: No results for input(s): GLUCAP in the last 168 hours. Lipid Profile: No results for input(s): CHOL, HDL, LDLCALC, TRIG, CHOLHDL, LDLDIRECT in the last 72  hours. Thyroid  Function Tests: No results for input(s): TSH, T4TOTAL, FREET4, T3FREE, THYROIDAB in the last 72 hours. Anemia Panel: No results for input(s): VITAMINB12, FOLATE, FERRITIN, TIBC, IRON, RETICCTPCT in the last 72 hours. Sepsis Labs: No results for input(s): PROCALCITON, LATICACIDVEN in the last 168 hours.  Recent Results (from the past 240 hours)  Blood culture (routine x 2)     Status: Abnormal   Collection Time: 06/15/24  8:10 PM   Specimen: BLOOD  Result Value Ref Range Status   Specimen Description   Final    BLOOD RIGHT ANTECUBITAL Performed at [facility], [address], [place], [place] [address]    Special Requests   Final    BOTTLES DRAWN AEROBIC AND ANAEROBIC Blood Culture adequate volume Performed at [facility], [address], [place], [place] [address]    Culture  Setup Time   Final    GRAM POSITIVE COCCI IN CLUSTERS IN BOTH AEROBIC AND ANAEROBIC BOTTLES CRITICAL RESULT CALLED TO, READ BACK BY  AND VERIFIED WITH: PHARMD SYDNEY DAVIS [id number] AT 2050 BY EC    Culture (A)  Final    STAPHYLOCOCCUS HOMINIS STAPHYLOCOCCUS EPIDERMIDIS THE SIGNIFICANCE OF ISOLATING THIS ORGANISM FROM A SINGLE SET OF BLOOD CULTURES WHEN MULTIPLE SETS ARE DRAWN IS UNCERTAIN. PLEASE NOTIFY THE MICROBIOLOGY DEPARTMENT WITHIN ONE WEEK IF SPECIATION AND SENSITIVITIES ARE REQUIRED. Performed at [facility], [address] [place], [place] [address]    Report Status 06/18/2024 FINAL  Final  Blood Culture ID Panel (Reflexed)     Status: Abnormal   Collection Time: 06/15/24  8:10 PM  Result Value Ref Range Status   Enterococcus faecalis NOT DETECTED NOT DETECTED Final   Enterococcus Faecium NOT DETECTED NOT DETECTED Final   Listeria monocytogenes NOT DETECTED NOT DETECTED Final   Staphylococcus species DETECTED (A) NOT DETECTED Final    Comment: CRITICAL RESULT CALLED TO, READ BACK BY AND VERIFIED WITH: PHARMD  SYDNEY DAVIS [id number] AT 2050 BY EC    Staphylococcus aureus (BCID) NOT DETECTED NOT DETECTED Final   Staphylococcus epidermidis DETECTED (A) NOT DETECTED Final    Comment: Methicillin (oxacillin) resistant coagulase negative staphylococcus. Possible blood culture contaminant (unless isolated from more than one blood culture draw or clinical case suggests pathogenicity). No antibiotic treatment is indicated for blood  culture contaminants. REPEAT WITH NEW VIAL OF CTRL PHARMD SYDNEY DAVIS [id number] AT 2050 BY EC    Staphylococcus lugdunensis NOT DETECTED NOT DETECTED Final   Streptococcus species NOT DETECTED NOT DETECTED Final   Streptococcus agalactiae NOT DETECTED NOT DETECTED Final   Streptococcus pneumoniae NOT DETECTED NOT DETECTED Final   Streptococcus pyogenes NOT DETECTED NOT DETECTED Final   A.calcoaceticus-baumannii NOT DETECTED NOT DETECTED Final   Bacteroides fragilis NOT DETECTED NOT DETECTED Final   Enterobacterales NOT DETECTED NOT DETECTED Final   Enterobacter cloacae complex NOT DETECTED NOT DETECTED Final   Escherichia coli NOT DETECTED NOT DETECTED Final   Klebsiella aerogenes NOT DETECTED NOT DETECTED Final   Klebsiella oxytoca NOT DETECTED NOT DETECTED Final   Klebsiella pneumoniae NOT DETECTED NOT DETECTED Final   Proteus species NOT DETECTED NOT DETECTED Final   Salmonella species NOT DETECTED NOT DETECTED Final   Serratia marcescens NOT DETECTED NOT DETECTED Final   Haemophilus influenzae NOT DETECTED NOT DETECTED Final   Neisseria meningitidis NOT DETECTED NOT DETECTED Final   Pseudomonas aeruginosa NOT DETECTED NOT DETECTED Final   Stenotrophomonas maltophilia NOT DETECTED NOT DETECTED Final   Candida albicans NOT DETECTED NOT DETECTED Final   Candida auris NOT DETECTED NOT DETECTED Final   Candida glabrata NOT DETECTED NOT DETECTED Final   Candida krusei NOT DETECTED NOT DETECTED Final   Candida parapsilosis NOT DETECTED NOT DETECTED Final   Candida  tropicalis NOT DETECTED NOT DETECTED Final   Cryptococcus neoformans/gattii NOT DETECTED NOT DETECTED Final   Methicillin resistance mecA/C DETECTED (A) NOT DETECTED Final    Comment: CRITICAL RESULT CALLED TO, READ BACK BY AND VERIFIED WITH: PHARMD SYDNEY DAVIS [id number] AT 2050 BY EC Performed at [facility], [address], [place], [place] [address]   Resp panel by RT-PCR (RSV, Flu A&B, Covid) Anterior Nasal Swab     Status: None   Collection Time: 06/15/24 10:13 PM   Specimen: Anterior Nasal Swab  Result Value Ref Range Status   SARS Coronavirus 2 by RT PCR NEGATIVE NEGATIVE Final    Comment: (NOTE) SARS-CoV-2 target nucleic acids are NOT DETECTED.  The SARS-CoV-2 RNA is generally detectable in upper respiratory specimens during  the acute phase of infection. The lowest concentration of SARS-CoV-2 viral copies this assay can detect is 138 copies/mL. A negative result does not preclude SARS-Cov-2 infection and should not be used as the sole basis for treatment or other patient management decisions. A negative result may occur with  improper specimen collection/handling, submission of specimen other than nasopharyngeal swab, presence of viral mutation(s) within the areas targeted by this assay, and inadequate number of viral copies(<138 copies/mL). A negative result must be combined with clinical observations, patient history, and epidemiological information. The expected result is Negative.  Fact Sheet for Patients:  bloggercourse.com  Fact Sheet for Healthcare Providers:  seriousbroker.it  This test is no t yet approved or cleared by the [place]  FDA and  has been authorized for detection and/or diagnosis of SARS-CoV-2 by FDA under an Emergency Use Authorization (EUA). This EUA will remain  in effect (meaning this test can be used) for the duration of the COVID-19 declaration under Section 564(b)(1) of the Act,  21 U.S.C.section 360bbb-3(b)(1), unless the authorization is terminated  or revoked sooner.       Influenza A by PCR NEGATIVE NEGATIVE Final   Influenza B by PCR NEGATIVE NEGATIVE Final    Comment: (NOTE) The Xpert Xpress SARS-CoV-2/FLU/RSV plus assay is intended as an aid in the diagnosis of influenza from Nasopharyngeal swab specimens and should not be used as a sole basis for treatment. Nasal washings and aspirates are unacceptable for Xpert Xpress SARS-CoV-2/FLU/RSV testing.  Fact Sheet for Patients: bloggercourse.com  Fact Sheet for Healthcare Providers: seriousbroker.it  This test is not yet approved or cleared by the [place]  FDA and has been authorized for detection and/or diagnosis of SARS-CoV-2 by FDA under an Emergency Use Authorization (EUA). This EUA will remain in effect (meaning this test can be used) for the duration of the COVID-19 declaration under Section 564(b)(1) of the Act, 21 U.S.C. section 360bbb-3(b)(1), unless the authorization is terminated or revoked.     Resp Syncytial Virus by PCR NEGATIVE NEGATIVE Final    Comment: (NOTE) Fact Sheet for Patients: bloggercourse.com  Fact Sheet for Healthcare Providers: seriousbroker.it  This test is not yet approved or cleared by the [place]  FDA and has been authorized for detection and/or diagnosis of SARS-CoV-2 by FDA under an Emergency Use Authorization (EUA). This EUA will remain in effect (meaning this test can be used) for the duration of the COVID-19 declaration under Section 564(b)(1) of the Act, 21 U.S.C. section 360bbb-3(b)(1), unless the authorization is terminated or revoked.  Performed at [facility], [address] [place], [place] [address]   Culture, blood (Routine X 2) w Reflex to ID Panel     Status: None   Collection Time: 06/17/24  1:18 PM   Specimen:  BLOOD  Result Value Ref Range Status   Specimen Description   Final    BLOOD SITE NOT SPECIFIED Performed at [facility], [address], [place], [place] [address]    Special Requests   Final    BOTTLES DRAWN AEROBIC ONLY Blood Culture results may not be optimal due to an inadequate volume of blood received in culture bottles Performed at [facility], [address] [place], [place] [address]    Culture   Final    NO GROWTH 5 DAYS Performed at [facility], [address], [place], [place] [address]    Report Status 06/22/2024 FINAL  Final  Culture, blood (Routine X 2) w Reflex  to ID Panel     Status: None   Collection Time: 06/17/24  1:24 PM   Specimen: BLOOD  Result Value Ref Range Status   Specimen Description   Final    BLOOD SITE NOT SPECIFIED Performed at [facility], [address], [place], [place] [address]    Special Requests   Final    BOTTLES DRAWN AEROBIC ONLY Blood Culture adequate volume Performed at [facility], [address] [place], [place] [address]    Culture   Final    NO GROWTH 5 DAYS Performed at [facility], [address] [place], [place] [address]    Report Status 06/22/2024 FINAL  Final      Radiology Studies: No results found.    Scheduled Meds:  acetaminophen   1,000 mg Oral Q8H   aspirin   81 mg Oral BID   busPIRone   10 mg Oral BID   docusate sodium   100 mg Oral BID   PARoxetine   40 mg Oral QHS   temazepam   30 mg Oral QHS   Continuous Infusions:  dextrose  5 % and 0.9 % NaCl 40 mL/hr at 06/24/24 2052     LOS: 9 days   Time spent: 25 minutes   Delon Hoe, DO [facility] 06/25/2024, 2:11 PM   Available via Epic secure chat 7am-7pm After these hours, please refer to coverage provider listed on [email or web address]  "

## 2024-06-26 DIAGNOSIS — S065XAA Traumatic subdural hemorrhage with loss of consciousness status unknown, initial encounter: Secondary | ICD-10-CM | POA: Diagnosis not present

## 2024-06-26 NOTE — Progress Notes (Signed)
 " PROGRESS NOTE    Courtney Avila  FMW:[id number] DOB: Mar 12, 1941 DOA: 06/15/2024 PCP: [name], NP     Brief Narrative:  Courtney Avila is an 83 y.o. 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  Neurosurgery was consulted recommended conservative management.  New events last 24 hours / Subjective: Patient pleasant in demeanor.  Dementia at baseline.  No acute events reported.  Assessment & Plan:   Principal Problem:   Subdural hematoma (HCC) Active Problems:   OA (osteoarthritis) of knee   Dementia (HCC)   GERD (gastroesophageal reflux disease)   Closed 2-part intertrochanteric fracture of proximal end of femur with delayed healing, right   Closed compression fracture of body of L1 vertebra (HCC)   Displaced fracture of base of neck of right femur, initial encounter for closed fracture (HCC)   Subdural hematoma - Repeat CT scan showed stable subdural hematoma.  Neurosurgery has recommended no further treatment.  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 (Active)      Wound 06/18/24 1857 Pressure Injury Heel Right Stage 1 -  Intact skin with non-blanchable redness of a localized area usually over a bony prominence. (Active)    DVT prophylaxis:  SCDs Start: 06/16/24 2152 SCDs Start: 06/16/24 0734  Code Status: DNR Family Communication: None at bedside Disposition Plan: SNF placement, insurance shara is a barrier Status is: Inpatient Remains inpatient appropriate because: Placement pending    Antimicrobials:  Anti-infectives (From admission, onward)    Start     Dose/Rate Route Frequency Ordered Stop   06/22/24 1000  sulfamethoxazole -trimethoprim  (BACTRIM  DS) 800-160 MG per tablet 1 tablet  Status:  Discontinued        1 tablet Oral Every 12 hours 06/22/24 0859 06/24/24 1147   06/19/24 2200  sulfamethoxazole -trimethoprim  (BACTRIM  DS) 800-160 MG per tablet 1 tablet  Status:  Discontinued        1 tablet Oral Every 12 hours 06/19/24 1706 06/22/24 0859   06/19/24 0000  sulfamethoxazole -trimethoprim  (BACTRIM  DS) 800-160 MG tablet  Status:  Discontinued        1 tablet Oral Every 12 hours 06/19/24 1707 06/24/24    06/16/24 2245  ceFAZolin  (ANCEF ) IVPB 2g/100 mL premix        2 g 200 mL/hr over 30 Minutes Intravenous Every 6 hours 06/16/24 2152 06/17/24 1406   06/16/24 1245  ceFAZolin  (ANCEF ) IVPB 2g/100 mL premix        2 g 200 mL/hr over 30 Minutes Intravenous On call to O.R. 06/16/24 1230 06/16/24 1450   06/16/24 1231  ceFAZolin  (ANCEF ) 2-4 GM/100ML-% IVPB       Note to  Pharmacy: [name] D: cabinet override      06/16/24 1231 06/16/24 1458   06/16/24 1000  cefTRIAXone  (ROCEPHIN ) 1 g in sodium chloride  0.9 % 100 mL IVPB        1 g 200 mL/hr over 30 Minutes Intravenous Every 24 hours 06/16/24 0938 06/18/24 1238        Objective: Vitals:   06/25/24 0318 06/25/24 1412 06/25/24 2005 06/26/24 0501  BP: (!) 117/54 135/74 106/74 (!) 122/44  Pulse: 62 87 68 (!) 57  Resp: 18 (!) 22 19 17   Temp: 98.4 F (36.9 C) 98.8 F (37.1 C) 98.1  F (36.7 C) 98.1 F (36.7 C)  TempSrc: Oral  Oral Oral  SpO2: 97% 96% 97% 100%    Intake/Output Summary (Last 24 hours) at 06/26/2024 1006 Last data filed at 06/26/2024 9061 Gross per 24 hour  Intake 420 ml  Output 350 ml  Net 70 ml   There were no vitals filed for this visit.  Examination:  General exam: Appears calm and comfortable  Respiratory system: Clear to auscultation. Respiratory effort normal. No respiratory distress. No conversational dyspnea.  Cardiovascular system: S1 & S2 heard, RRR. Gastrointestinal system: Abdomen is nondistended, soft and nontender. Normal bowel sounds heard. Central nervous system: Alert Extremities: Symmetric in appearance  Skin: No rashes, lesions or ulcers on exposed skin   Data Reviewed: I have personally reviewed following labs and imaging studies  CBC: Recent Labs  Lab 06/19/24 1516  WBC 9.9  HGB 9.4*  HCT 28.0*  MCV 92.7  PLT 388   Basic Metabolic Panel: No results for input(s): NA, K, CL, CO2, GLUCOSE, BUN, CREATININE, CALCIUM, MG, PHOS in the last 168 hours. GFR: CrCl cannot be calculated (Unknown ideal weight.). Liver Function Tests: No results for input(s): AST, ALT, ALKPHOS, BILITOT, PROT, ALBUMIN  in the last 168 hours. No results for input(s): LIPASE, AMYLASE in the last 168 hours. No results for input(s): AMMONIA in the last 168 hours. Coagulation Profile: No results for input(s): INR, PROTIME in the last 168 hours. Cardiac Enzymes: No results for input(s): CKTOTAL, CKMB, CKMBINDEX, TROPONINI in the last 168 hours. BNP (last 3 results) No results for input(s): PROBNP in the last 8760 hours. HbA1C: No results for input(s): HGBA1C in the last 72 hours. CBG: No results for input(s): GLUCAP in the last 168 hours. Lipid Profile: No results for input(s): CHOL, HDL, LDLCALC, TRIG, CHOLHDL, LDLDIRECT in the last 72 hours. Thyroid  Function Tests: No  results for input(s): TSH, T4TOTAL, FREET4, T3FREE, THYROIDAB in the last 72 hours. Anemia Panel: No results for input(s): VITAMINB12, FOLATE, FERRITIN, TIBC, IRON, RETICCTPCT in the last 72 hours. Sepsis Labs: No results for input(s): PROCALCITON, LATICACIDVEN in the last 168 hours.  Recent Results (from the past 240 hours)  Culture, blood (Routine X 2) w Reflex to ID Panel     Status: None   Collection Time: 06/17/24  1:18 PM   Specimen: BLOOD  Result Value Ref Range Status   Specimen Description   Final    BLOOD SITE NOT SPECIFIED Performed at [facility], [address], [place], [place] [address]    Special Requests   Final    BOTTLES DRAWN AEROBIC ONLY Blood Culture results may not be optimal due to an inadequate volume of blood received in culture bottles Performed at [facility], [address], [place], [place] [address]    Culture   Final    NO GROWTH 5 DAYS Performed at [facility], [address] [place], [place] [address]    Report Status 06/22/2024 FINAL  Final  Culture, blood (Routine X 2) w Reflex to ID Panel     Status: None   Collection Time: 06/17/24  1:24 PM   Specimen: BLOOD  Result Value Ref Range Status   Specimen Description   Final    BLOOD SITE NOT SPECIFIED Performed at [facility], [address] [place], [place] [address]    Special Requests   Final    BOTTLES DRAWN AEROBIC ONLY Blood Culture adequate volume Performed at [facility], [address] [place], [place] [address]    Culture   Final    NO GROWTH 5 DAYS Performed at [facility], [address], [place], [place] [address]    Report Status 06/22/2024 FINAL  Final      Radiology Studies: No results found.    Scheduled Meds:  acetaminophen   1,000 mg Oral Q8H   aspirin   81 mg Oral BID   busPIRone   10 mg Oral BID   docusate sodium   100 mg Oral BID   PARoxetine   40 mg Oral QHS    temazepam   30 mg Oral QHS   Continuous Infusions:     LOS: 10 days   Time spent: 25 minutes   Delon Hoe, DO [facility] 06/26/2024, 10:06 AM   Available via Epic secure chat 7am-7pm After these hours, please refer to coverage provider listed on [email or web address]  "

## 2024-06-26 NOTE — TOC Progression Note (Signed)
 Transition of Care [facility]) - Progression Note   Patient Details  Name: Courtney Avila MRN: [id number] Date of Birth: 1941-03-24  Transition of Care [facility]) CM/SW Contact  Duwaine GORMAN Aran, LCSW Phone Number: 06/26/2024, 9:37 AM  Clinical Narrative: CSW spoke with son, [name] Cline, to provide the number to file an appeal for SNF as [employer] denied the SNF request. Care management awaiting outcome of appeal.  Expected Discharge Plan: Skilled Nursing Facility Barriers to Discharge: Continued Medical Work up, SNF Pending bed offer, Insurance Authorization               Expected Discharge Plan and Services       Living arrangements for the past 2 months: Single Family Home                                       Social Drivers of Health (SDOH) Interventions SDOH Screenings   Food Insecurity: No Food Insecurity (06/17/2024)  Housing: Unknown (06/17/2024)  Transportation Needs: No Transportation Needs (06/17/2024)  Utilities: Not At Risk (06/17/2024)  Depression (PHQ2-9): Low Risk (05/01/2024)  Social Connections: Socially Isolated (06/17/2024)  Tobacco Use: Low Risk (06/16/2024)    Readmission Risk Interventions     No data to display

## 2024-06-26 NOTE — Plan of Care (Signed)

## 2024-06-27 ENCOUNTER — Other Ambulatory Visit: Payer: Self-pay

## 2024-06-27 DIAGNOSIS — S065XAA Traumatic subdural hemorrhage with loss of consciousness status unknown, initial encounter: Secondary | ICD-10-CM | POA: Diagnosis not present

## 2024-06-27 MED ORDER — MORPHINE SULFATE 10 MG/5ML PO SOLN
10.0000 mg | ORAL | Status: DC | PRN
  Administered 2024-06-27 – 2024-07-03 (×11): 10 mg via ORAL
  Filled 2024-06-27 (×10): qty 5

## 2024-06-27 MED ORDER — HYDROMORPHONE HCL 1 MG/ML IJ SOLN
0.5000 mg | Freq: Four times a day (QID) | INTRAMUSCULAR | Status: DC | PRN
  Administered 2024-06-27 – 2024-07-03 (×13): 0.5 mg via INTRAVENOUS
  Filled 2024-06-27 (×13): qty 0.5

## 2024-06-27 NOTE — Progress Notes (Signed)
 " PROGRESS NOTE    Courtney Avila  FMW:[id number] DOB: 12-03-1940 DOA: 06/15/2024 PCP: [name], NP    Brief Narrative:  83 year old with hypertension, dementia presented with unwitnessed fall, found to have subdural hematoma and right hip fracture.  Underwent right hip arthroplasty 12/12.  Neurosurgery recommended conservative management for subdural. Not progressing well.  Remains in the hospital, unable to go to SNF.  Subjective: Patient seen and examined.  Complains of pain in the hip area.  She is alert and awake, oriented to herself.  She is sitting both legs drawn up and Curled up. Complains of pain on attempted mobility.  Patient cried in pain while therapist trying to get her out of bed.  No family at the bedside.  Assessment & Plan:   Right hip fracture, traumatic closed:  Total hip arthroplasty 12/12, Dr. Vernetta Aspirin  81 mg twice daily for DVT prophylaxis Weightbearing as tolerated PT OT following, SNF recommended however patient is not tolerating therapies well. Pain management with scheduled Tylenol , oxycodone  and morphine  IV. Will start patient on sublingual morphine  to optimize pain control, continue Tylenol .  Use small dose of Dilaudid  as needed for dressing changes, mobility.  Acute blood loss anemia: As anticipated from long bone fracture and procedures.  Received treatments of PRBC with appropriate response.  Subdural hematoma: Recommended conservative management by neurosurgery.  Repeat scan with stable ET.  Dementia with behavioral disturbances, poor progress from the hip fracture and failure to thrive: Symptomatic management.  Pain medications.  Restoril , BuSpar  and Paxil . Patient not improving appropriately despite optimizing treatment. May benefit with palliation or hospice.  Will consult palliative care team.  Wound 06/18/24 1820 Pressure Injury Buttocks Right;Medial;Mid Deep Tissue Pressure Injury - Purple or maroon localized area of discolored  intact skin or blood-filled blister due to damage of underlying soft tissue from pressure and/or shear. (Active)     Wound 06/18/24 1857 Pressure Injury Heel Right Stage 1 -  Intact skin with non-blanchable redness of a localized area usually over a bony prominence. (Active)        DVT prophylaxis: SCDs Start: 06/16/24 2152 SCDs Start: 06/16/24 0734   Code Status: DNR/DNI Family Communication: None at the bedside.  Patient's son called, unable to pick up the phone. Disposition Plan: Status is: Inpatient Remains inpatient appropriate because: Unsafe discharge planning     Consultants:  Orthopedics Palliative  Procedures:  Right hip total arthroplasty  Antimicrobials:  None     Objective: Vitals:   06/26/24 1315 06/26/24 2025 06/26/24 2025 06/27/24 0426  BP: 108/73  (!) 126/44 (!) 108/53  Pulse: 65  75 63  Resp: 16 20 20 19   Temp: 98.4 F (36.9 C) 98.5 F (36.9 C)  98 F (36.7 C)  TempSrc:  Axillary  Oral  SpO2: 100%  99% 100%    Intake/Output Summary (Last 24 hours) at 06/27/2024 1314 Last data filed at 06/27/2024 1100 Gross per 24 hour  Intake 960 ml  Output 300 ml  Net 660 ml   There were no vitals filed for this visit.  Examination:  General exam: Very frail and debilitated, in moderate distress with mobility. Respiratory system: Clear to auscultation. Respiratory effort normal.  No added sounds.  On room air. Cardiovascular system: S1 & S2 heard, RRR.  Gastrointestinal system: Abdomen is nondistended, soft and nontender. No organomegaly or masses felt. Normal bowel sounds heard. Central nervous system: Alert and awake.  Oriented to herself.  Gross generalized weakness. Keeping lower extremities and contracted position.  Data Reviewed: I have personally reviewed following labs and imaging studies  CBC: No results for input(s): WBC, NEUTROABS, HGB, HCT, MCV, PLT in the last 168 hours. Basic Metabolic Panel: No results for input(s):  NA, K, CL, CO2, GLUCOSE, BUN, CREATININE, CALCIUM, MG, PHOS in the last 168 hours. GFR: CrCl cannot be calculated (Unknown ideal weight.). Liver Function Tests: No results for input(s): AST, ALT, ALKPHOS, BILITOT, PROT, ALBUMIN  in the last 168 hours. No results for input(s): LIPASE, AMYLASE in the last 168 hours. No results for input(s): AMMONIA in the last 168 hours. Coagulation Profile: No results for input(s): INR, PROTIME in the last 168 hours. Cardiac Enzymes: No results for input(s): CKTOTAL, CKMB, CKMBINDEX, TROPONINI in the last 168 hours. BNP (last 3 results) No results for input(s): PROBNP in the last 8760 hours. HbA1C: No results for input(s): HGBA1C in the last 72 hours. CBG: No results for input(s): GLUCAP in the last 168 hours. Lipid Profile: No results for input(s): CHOL, HDL, LDLCALC, TRIG, CHOLHDL, LDLDIRECT in the last 72 hours. Thyroid  Function Tests: No results for input(s): TSH, T4TOTAL, FREET4, T3FREE, THYROIDAB in the last 72 hours. Anemia Panel: No results for input(s): VITAMINB12, FOLATE, FERRITIN, TIBC, IRON, RETICCTPCT in the last 72 hours. Sepsis Labs: No results for input(s): PROCALCITON, LATICACIDVEN in the last 168 hours.  Recent Results (from the past 240 hours)  Culture, blood (Routine X 2) w Reflex to ID Panel     Status: None   Collection Time: 06/17/24  1:18 PM   Specimen: BLOOD  Result Value Ref Range Status   Specimen Description   Final    BLOOD SITE NOT SPECIFIED Performed at [facility], [address], [place], [place] [address]    Special Requests   Final    BOTTLES DRAWN AEROBIC ONLY Blood Culture results may not be optimal due to an inadequate volume of blood received in culture bottles Performed at [facility], [address] [place], [place] [address]    Culture   Final    NO GROWTH 5 DAYS Performed at [facility], [address], [place], [place] [address]    Report Status 06/22/2024 FINAL  Final  Culture, blood (Routine X 2) w Reflex to ID Panel     Status: None   Collection Time: 06/17/24  1:24 PM   Specimen: BLOOD  Result Value Ref Range Status   Specimen Description   Final    BLOOD SITE NOT SPECIFIED Performed at [facility], [address] [place], [place] [address]    Special Requests   Final    BOTTLES DRAWN AEROBIC ONLY Blood Culture adequate volume Performed at [facility], [address] [place], [place] [address]    Culture   Final    NO GROWTH 5 DAYS Performed at [facility], [address], [place], [place] [address]    Report Status 06/22/2024 FINAL  Final         Radiology Studies: No results found.      Scheduled Meds:  acetaminophen   1,000 mg Oral Q8H   aspirin   81 mg Oral BID   busPIRone   10 mg Oral BID   docusate sodium   100 mg Oral BID   PARoxetine   40 mg Oral QHS   temazepam   30 mg Oral QHS   Continuous Infusions:   LOS: 11 days    Time spent: 40 minutes    Renato Applebaum, MD [facility]   "

## 2024-06-27 NOTE — Progress Notes (Addendum)
 Physical Therapy Treatment Patient Details Name: Courtney Avila MRN: [id number] DOB: 10-02-1940 Today's Date: 06/27/2024   History of Present Illness 83 y.o. female was brought by EMS from home with concern for possible UTI by family. Patient apparently has had generalized pain since she had a fall 2 days ago. She is also having pelvic pain that the family thought was possibly UTI. She is normally ambulatory and can communicate despite advanced dementia. She had a fall 2 prior to admission and since that she has been less ambulatory. on Evaluation in ED pt was determined to have a 7 mm subdural hematoma,  right intertrochanteric fracture,  and and L1 compression fracture all presumed to be from the fall. Orthopedics and neurosurgery were both consulted PMH: Alzheimer's dementia, osteoarthritis, GERD, anxiety disorder, hypertension   was brought by EMS from home with concern for possible UTI by family.   Orthopedics and neurosurgery were both consulted. pt is s/p R DA THA    PT Comments   Pt admitted with above diagnosis.  Pt currently with functional limitations due to the deficits listed below (see PT Problem List). Pt in bed when PT arrived. Pt agreeable to therapy intervention, pt able to provide name: Courtney Avila. Pt is oriented to first name only. Pt in flexed position in bed with slight R side lying. Pt noted to have B LE flexion contractures L > R as well as B PF with PT able to achieve R ankle neutral and L ~8 degrees PF. Pt required extensive assist for supine <> sit, mod/max A x 2 for sitting balance with strong posterior lean and pt vocalizing in pain for the duration of intervention. PT unable to redirect pt while seated EOB, pt returned to semi reclined position with R side lying per pt apparent preference with use of pillows as props for comfort and offloading. Pt HR elevated to 120-130 with intervention. Pt reported being thirsty and PT made several attempts to have pt drink from straw when in  upright position with pt having absent ability at that time to purse lips to draw liquid through the straw. Pt left in bed, all needs in place and nurse entered room and aware of pt pain response, positioning, contractures and inability to use straw. PT reached out to SLP via [facility] concerning pt demonstrated difficultly with po intake. Patient will benefit from continued inpatient follow up therapy, <3 hours/day.  Pt appears to have guarded rehab potenital at this time and will require 24/7 supervision and assist in next venue.   Pt will benefit from acute skilled PT to increase their independence and safety with mobility to allow discharge.      If plan is discharge home, recommend the following: Two people to help with bathing/dressing/bathroom;Help with stairs or ramp for entrance;Assist for transportation;Two people to help with walking and/or transfers   Can travel by private vehicle        Equipment Recommendations  Other (comment) (TBD at next venue of care)    Recommendations for Other Services       Precautions / Restrictions Precautions Precautions: Fall Precaution/Restrictions Comments: KI DCed Restrictions Weight Bearing Restrictions Per Provider Order: Yes RLE Weight Bearing Per Provider Order: Weight bearing as tolerated Other Position/Activity Restrictions: WBAT     Mobility  Bed Mobility Overal bed mobility: Needs Assistance Bed Mobility: Supine to Sit, Sit to Supine     Supine to sit: Total assist, +2 for physical assistance, +2 for safety/equipment, HOB elevated Sit to supine: +  2 for physical assistance, Total assist, +2 for safety/equipment   General bed mobility comments: total A x 2 for supine <> sit with use of bed pad and hospital bed features    Transfers                   General transfer comment: NT 2* pain, anxiety, poor sitting balance    Ambulation/Gait                   Stairs             Wheelchair Mobility     Tilt  Bed    Modified Rankin (Stroke Patients Only)       Balance Overall balance assessment: Needs assistance Sitting-balance support: Feet supported, Bilateral upper extremity supported Sitting balance-Leahy Scale: Zero Sitting balance - Comments: mod to max A x 2 for sitting balance EOB with strong cues for encouragement and pt vocalizing in pain and exhibiting posterior pushing response with limited abiltiy to redirect pt                                    Communication Communication Communication: Impaired Factors Affecting Communication: Reduced clarity of speech  Cognition Arousal: Alert Behavior During Therapy: Anxious, Restless   PT - Cognitive impairments: History of cognitive impairments, No family/caregiver present to determine baseline                         Following commands: Impaired Following commands impaired: Follows one step commands inconsistently    Cueing Cueing Techniques: Verbal cues, Tactile cues, Gestural cues, Visual cues  Exercises General Exercises - Lower Extremity Ankle Circles/Pumps: AAROM, Both, 10 reps, Supine Long Arc Quad: Limitations Long Arc Quad Limitations: attempted in sitting but pt unable; L knee appears to be contracted in flexed position, knee ext PROM L knee ~ -70* Heel Slides: Limitations Heel Slides Limitations: could not tolerate 2* pain    General Comments General comments (skin integrity, edema, etc.): pt reported her name was pasty cline, pt appears to be a poor historian and unable to provide insight to PLOF with pt stating that she cared for herself at home alone      Pertinent Vitals/Pain Pain Assessment Pain Assessment: Faces Faces Pain Scale: Hurts whole lot Pain Location: L LE Pain Descriptors / Indicators: Grimacing, Guarding, Discomfort, Moaning, Crying Pain Intervention(s): Limited activity within patient's tolerance, Monitored during session    Home Living                           Prior Function            PT Goals (current goals can now be found in the care plan section) Acute Rehab PT Goals PT Goal Formulation: Patient unable to participate in goal setting Time For Goal Achievement: 07/01/24 Potential to Achieve Goals: Poor Progress towards PT goals: Progressing toward goals    Frequency    Min 2X/week      PT Plan      Co-evaluation              AM-PAC PT 6 Clicks Mobility   Outcome Measure  Help needed turning from your back to your side while in a flat bed without using bedrails?: Total Help needed moving from lying on your back to sitting on the side of a  flat bed without using bedrails?: Total Help needed moving to and from a bed to a chair (including a wheelchair)?: Total Help needed standing up from a chair using your arms (e.g., wheelchair or bedside chair)?: Total Help needed to walk in hospital room?: Total Help needed climbing 3-5 steps with a railing? : Total 6 Click Score: 6    End of Session   Activity Tolerance: Patient limited by pain;Other (comment) (anxiety) Patient left: in bed;with bed alarm set;with call bell/phone within reach Nurse Communication: Mobility status PT Visit Diagnosis: Other abnormalities of gait and mobility (R26.89);Pain;Difficulty in walking, not elsewhere classified (R26.2);Adult, failure to thrive (R62.7) Pain - Right/Left: Left Pain - part of body: Leg;Knee;Hip     Time: 1049-1106 PT Time Calculation (min) (ACUTE ONLY): 17 min  Charges:    $Therapeutic Activity: 8-22 mins PT General Charges $$ ACUTE PT VISIT: 1 Visit                     [place], PT Acute Rehab    [place] VEAR Drone 06/27/2024, 1:30 PM

## 2024-06-28 DIAGNOSIS — S32010A Wedge compression fracture of first lumbar vertebra, initial encounter for closed fracture: Secondary | ICD-10-CM | POA: Diagnosis not present

## 2024-06-28 DIAGNOSIS — G309 Alzheimer's disease, unspecified: Secondary | ICD-10-CM | POA: Diagnosis not present

## 2024-06-28 DIAGNOSIS — F02C0 Dementia in other diseases classified elsewhere, severe, without behavioral disturbance, psychotic disturbance, mood disturbance, and anxiety: Secondary | ICD-10-CM

## 2024-06-28 DIAGNOSIS — S065XAA Traumatic subdural hemorrhage with loss of consciousness status unknown, initial encounter: Secondary | ICD-10-CM | POA: Diagnosis not present

## 2024-06-28 DIAGNOSIS — K219 Gastro-esophageal reflux disease without esophagitis: Secondary | ICD-10-CM

## 2024-06-28 DIAGNOSIS — S72041A Displaced fracture of base of neck of right femur, initial encounter for closed fracture: Secondary | ICD-10-CM | POA: Diagnosis not present

## 2024-06-28 DIAGNOSIS — S72141G Displaced intertrochanteric fracture of right femur, subsequent encounter for closed fracture with delayed healing: Secondary | ICD-10-CM

## 2024-06-28 NOTE — Progress Notes (Signed)
 " PROGRESS NOTE  Courtney Avila FMW:[id number] DOB: March 13, 1941   PCP: [name], NP  Patient is from: Home.  DOA: 06/15/2024 LOS: 12  Chief complaints Chief Complaint  Patient presents with   uti     Brief Narrative / Interim history: 83 year old with hypertension, dementia presented with unwitnessed fall, found to have subdural hematoma and right hip fracture.  Underwent right hip arthroplasty 12/12.  Neurosurgery recommended conservative management for subdural. Not progressing well.  Insurance denied SNF.  Subjective: Seen and examined earlier this morning.  No major events overnight or this morning.  No complaints but not a great historian.  She is awake and alert but only oriented to self.   Assessment and plan: Closed right hip fracture: Traumatic due to fall. -S/p Total hip arthroplasty 12/12, Dr. Vernetta -Aspirin  81 mg twice daily for DVT prophylaxis -Multimodal pain control with scheduled Tylenol  and as needed p.o. morphine  and IV Dilaudid . -Weightbearing as tolerated -Therapy recommended SNF.   Acute blood loss anemia due to surgical blood loss: Received blood transfusions.  Now stable. Recent Labs    07/12/23 1605 01/24/24 1540 06/15/24 1942 06/16/24 0918 06/17/24 1318 06/17/24 1539 06/18/24 0236 06/19/24 1516  HGB 11.9 10.8* 10.5* 9.1* 6.1* 5.9* 9.2* 9.4*  - Continue monitoring  Subdural hematoma: Repeat CT on 12/12 with stable mixed density left SDH - NSGY recommended conservative management.  Positive blood culture: Blood culture on 12/1 with Staph hominis and epidermidis in 1 out of 2 bottles likely contaminant.  Repeat blood culture on 12/13 negative.   Dementia with behavioral disturbances: LOC severe.  Awake but only oriented to self.   - Reorientation and delirium precaution  - Restoril , BuSpar  and Paxil . - Palliative consulted for goal of care.  Failure to thrive There is no height or weight on file to calculate BMI. - Palliative  consulted for goal of care        Pressure skin injury Wound 06/18/24 1820 Pressure Injury Buttocks Right;Medial;Mid Deep Tissue Pressure Injury - Purple or maroon localized area of discolored intact skin or blood-filled blister due to damage of underlying soft tissue from pressure and/or shear. (Active)     Wound 06/18/24 1857 Pressure Injury Heel Right Stage 1 -  Intact skin with non-blanchable redness of a localized area usually over a bony prominence. (Active)   DVT prophylaxis:  SCDs Start: 06/16/24 2152 SCDs Start: 06/16/24 0734  Code Status: DNR Family Communication: None at bedside Level of care: Med-Surg Status is: Inpatient Remains inpatient appropriate because: Lack of safe disposition   Final disposition: SNF?   35 minutes with more than 50% spent in reviewing records, counseling patient/family and coordinating care.  Consultants:  Orthopedic surgery Palliative medicine  Procedures: 12/12-right THA  Microbiology summarized: 12/11-COVID-19, influenza and RSV PCR nonreactive 12/11-blood culture with Staph hominis and epidermidis in 1 out of 2 bottles. 12/13-repeat blood cultures NGTD.  Objective: Vitals:   06/27/24 0426 06/27/24 2123 06/28/24 0615 06/28/24 1342  BP: (!) 108/53 111/71 103/85 (!) 109/92  Pulse: 63 68 63 69  Resp: 19 20 20 19   Temp: 98 F (36.7 C) 98.7 F (37.1 C) 98 F (36.7 C) 98 F (36.7 C)  TempSrc: Oral Oral  Oral  SpO2: 100% 99% 100% 97%    Examination:  GENERAL: No apparent distress.  Appears frail. HEENT: MMM.  Vision and hearing grossly intact.  NECK: Supple.  No apparent JVD.  RESP:  No IWOB.  Fair aeration bilaterally. CVS:  RRR. Heart sounds  normal.  ABD/GI/GU: BS+. Abd soft, NTND.  MSK/EXT:  Moves extremities.  Significant muscle mass and subcu fat loss. SKIN: Dressing over right hip DTI. NEURO: Awake.  Oriented to self.  No apparent focal neuro deficit. PSYCH: Calm. Normal affect.   Sch Meds:  Scheduled Meds:   acetaminophen   1,000 mg Oral Q8H   aspirin   81 mg Oral BID   busPIRone   10 mg Oral BID   docusate sodium   100 mg Oral BID   PARoxetine   40 mg Oral QHS   temazepam   30 mg Oral QHS   Continuous Infusions: PRN Meds:.acetaminophen  **OR** acetaminophen , HYDROmorphone  (DILAUDID ) injection, menthol  **OR** phenol, methocarbamol  **OR** [DISCONTINUED] methocarbamol  (ROBAXIN ) injection, morphine , ondansetron  **OR** ondansetron  (ZOFRAN ) IV  Antimicrobials: Anti-infectives (From admission, onward)    Start     Dose/Rate Route Frequency Ordered Stop   06/22/24 1000  sulfamethoxazole -trimethoprim  (BACTRIM  DS) 800-160 MG per tablet 1 tablet  Status:  Discontinued        1 tablet Oral Every 12 hours 06/22/24 0859 06/24/24 1147   06/19/24 2200  sulfamethoxazole -trimethoprim  (BACTRIM  DS) 800-160 MG per tablet 1 tablet  Status:  Discontinued        1 tablet Oral Every 12 hours 06/19/24 1706 06/22/24 0859   06/19/24 0000  sulfamethoxazole -trimethoprim  (BACTRIM  DS) 800-160 MG tablet  Status:  Discontinued        1 tablet Oral Every 12 hours 06/19/24 1707 06/24/24    06/16/24 2245  ceFAZolin  (ANCEF ) IVPB 2g/100 mL premix        2 g 200 mL/hr over 30 Minutes Intravenous Every 6 hours 06/16/24 2152 06/17/24 1406   06/16/24 1245  ceFAZolin  (ANCEF ) IVPB 2g/100 mL premix        2 g 200 mL/hr over 30 Minutes Intravenous On call to O.R. 06/16/24 1230 06/16/24 1450   06/16/24 1231  ceFAZolin  (ANCEF ) 2-4 GM/100ML-% IVPB       Note to Pharmacy: [name] D: cabinet override      06/16/24 1231 06/16/24 1458   06/16/24 1000  cefTRIAXone  (ROCEPHIN ) 1 g in sodium chloride  0.9 % 100 mL IVPB        1 g 200 mL/hr over 30 Minutes Intravenous Every 24 hours 06/16/24 0938 06/18/24 1238        I have personally reviewed the following labs and images: CBC: No results for input(s): WBC, NEUTROABS, HGB, HCT, MCV, PLT in the last 168 hours. BMP &GFR No results for input(s): NA, K, CL, CO2,  GLUCOSE, BUN, CREATININE, CALCIUM, MG, PHOS in the last 168 hours.  Invalid input(s): GFRCG CrCl cannot be calculated (Unknown ideal weight.). Liver & Pancreas: No results for input(s): AST, ALT, ALKPHOS, BILITOT, PROT, ALBUMIN  in the last 168 hours. No results for input(s): LIPASE, AMYLASE in the last 168 hours. No results for input(s): AMMONIA in the last 168 hours. Diabetic: No results for input(s): HGBA1C in the last 72 hours. No results for input(s): GLUCAP in the last 168 hours. Cardiac Enzymes: No results for input(s): CKTOTAL, CKMB, CKMBINDEX, TROPONINI in the last 168 hours. No results for input(s): PROBNP in the last 8760 hours. Coagulation Profile: No results for input(s): INR, PROTIME in the last 168 hours. Thyroid  Function Tests: No results for input(s): TSH, T4TOTAL, FREET4, T3FREE, THYROIDAB in the last 72 hours. Lipid Profile: No results for input(s): CHOL, HDL, LDLCALC, TRIG, CHOLHDL, LDLDIRECT in the last 72 hours. Anemia Panel: No results for input(s): VITAMINB12, FOLATE, FERRITIN, TIBC, IRON, RETICCTPCT in the last 72 hours. Urine analysis:  Component Value Date/Time   COLORURINE YELLOW 06/15/2024 2033   APPEARANCEUR HAZY (A) 06/15/2024 2033   LABSPEC 1.029 06/15/2024 2033   PHURINE 5.0 06/15/2024 2033   GLUCOSEU NEGATIVE 06/15/2024 2033   HGBUR NEGATIVE 06/15/2024 2033   BILIRUBINUR NEGATIVE 06/15/2024 2033   KETONESUR NEGATIVE 06/15/2024 2033   PROTEINUR 30 (A) 06/15/2024 2033   NITRITE NEGATIVE 06/15/2024 2033   LEUKOCYTESUR NEGATIVE 06/15/2024 2033   Sepsis Labs: Invalid input(s): PROCALCITONIN, LACTICIDVEN  Microbiology: No results found for this or any previous visit (from the past 240 hours).  Radiology Studies: No results found.    Beyla Loney T. Jhamal Plucinski [facility]  If 7PM-7AM, please contact night-coverage [email or web address] 06/28/2024, 2:15 PM   "

## 2024-06-28 NOTE — Plan of Care (Signed)
  Problem: Education: Goal: Knowledge of General Education information will improve Description: Including pain rating scale, medication(s)/side effects and non-pharmacologic comfort measures Outcome: Progressing   Problem: Health Behavior/Discharge Planning: Goal: Ability to manage health-related needs will improve Outcome: Progressing   Problem: Clinical Measurements: Goal: Ability to maintain clinical measurements within normal limits will improve Outcome: Progressing Goal: Will remain free from infection Outcome: Progressing Goal: Diagnostic test results will improve Outcome: Progressing Goal: Respiratory complications will improve Outcome: Progressing Goal: Cardiovascular complication will be avoided Outcome: Progressing   Problem: Nutrition: Goal: Adequate nutrition will be maintained Outcome: Progressing   Problem: Coping: Goal: Level of anxiety will decrease Outcome: Progressing   Problem: Elimination: Goal: Will not experience complications related to bowel motility Outcome: Progressing Goal: Will not experience complications related to urinary retention Outcome: Progressing   Problem: Pain Managment: Goal: General experience of comfort will improve and/or be controlled Outcome: Progressing   Problem: Safety: Goal: Ability to remain free from injury will improve Outcome: Progressing   Problem: Skin Integrity: Goal: Risk for impaired skin integrity will decrease Outcome: Progressing   Problem: Education: Goal: Knowledge of the prescribed therapeutic regimen will improve Outcome: Progressing Goal: Understanding of discharge needs will improve Outcome: Progressing Goal: Individualized Educational Video(s) Outcome: Progressing   Problem: Activity: Goal: Ability to avoid complications of mobility impairment will improve Outcome: Progressing Goal: Ability to tolerate increased activity will improve Outcome: Progressing   Problem: Clinical  Measurements: Goal: Postoperative complications will be avoided or minimized Outcome: Progressing   Problem: Pain Management: Goal: Pain level will decrease with appropriate interventions Outcome: Progressing   Problem: Skin Integrity: Goal: Will show signs of wound healing Outcome: Progressing   Problem: Activity: Goal: Risk for activity intolerance will decrease Outcome: Not Progressing

## 2024-06-28 NOTE — Progress Notes (Signed)
 Patient ID: Courtney Avila, female   DOB: 15-Apr-1941, 83 y.o.   MRN: [id number]  It is now a week and a half since the patient had surgery on her right hip due to an impacted hip fracture.  She is awaiting skilled nursing placement.  I did change her right hip dressing today and the incision is clean and dry and is not red.  I placed a new Aquacel dressing.  She lays in bed with her knees in a flexed and contracted position.  There has been otherwise no acute changes.

## 2024-06-28 NOTE — Plan of Care (Signed)
" °  Problem: Clinical Measurements: Goal: Ability to maintain clinical measurements within normal limits will improve Outcome: Progressing Goal: Will remain free from infection Outcome: Progressing Goal: Diagnostic test results will improve Outcome: Progressing Goal: Respiratory complications will improve Outcome: Progressing Goal: Cardiovascular complication will be avoided Outcome: Progressing   Problem: Coping: Goal: Level of anxiety will decrease Outcome: Progressing   Problem: Pain Managment: Goal: General experience of comfort will improve and/or be controlled Outcome: Progressing   Problem: Safety: Goal: Ability to remain free from injury will improve Outcome: Progressing   Problem: Skin Integrity: Goal: Risk for impaired skin integrity will decrease Outcome: Progressing   Problem: Education: Goal: Individualized Educational Video(s) Outcome: Progressing   Problem: Clinical Measurements: Goal: Postoperative complications will be avoided or minimized Outcome: Progressing   Problem: Pain Management: Goal: Pain level will decrease with appropriate interventions Outcome: Progressing   Problem: Skin Integrity: Goal: Will show signs of wound healing Outcome: Progressing   Problem: Education: Goal: Knowledge of General Education information will improve Description: Including pain rating scale, medication(s)/side effects and non-pharmacologic comfort measures Outcome: Not Progressing   Problem: Health Behavior/Discharge Planning: Goal: Ability to manage health-related needs will improve Outcome: Not Progressing   Problem: Activity: Goal: Risk for activity intolerance will decrease Outcome: Not Progressing   Problem: Nutrition: Goal: Adequate nutrition will be maintained Outcome: Not Progressing   Problem: Elimination: Goal: Will not experience complications related to bowel motility Outcome: Not Progressing Goal: Will not experience complications related  to urinary retention Outcome: Not Progressing   Problem: Education: Goal: Knowledge of the prescribed therapeutic regimen will improve Outcome: Not Progressing Goal: Understanding of discharge needs will improve Outcome: Not Progressing   Problem: Activity: Goal: Ability to avoid complications of mobility impairment will improve Outcome: Not Progressing Goal: Ability to tolerate increased activity will improve Outcome: Not Progressing   "

## 2024-06-28 NOTE — TOC Progression Note (Signed)
 Transition of Care [facility]) - Progression Note    Patient Details  Name: Courtney Avila MRN: [id number] Date of Birth: 27-Jan-1941  Transition of Care [facility]) CM/SW Contact  Courtney JONELLE Rex, RN Phone Number: 06/28/2024, 1:46 PM  Clinical Narrative:  Met with patient's son Courtney Avila at bedside, introduced self and role of INPT CM. Courtney Avila reports he has not initiated SNF denial appeal, states she called but was informed he will need patient's approval to initiate the Appeal on his behalf. NCM reviewed with Courtney Avila medical reasons to initiate the Appeal. Courtney Avila stats he will initiate Appeal today. INPT CM will continue to follow.      Expected Discharge Plan: Skilled Nursing Facility Barriers to Discharge: Continued Medical Work up, SNF Pending bed offer, Insurance Authorization               Expected Discharge Plan and Services       Living arrangements for the past 2 months: Single Family Home                                       Social Drivers of Health (SDOH) Interventions SDOH Screenings   Food Insecurity: No Food Insecurity (06/17/2024)  Housing: Low Risk (06/27/2024)  Transportation Needs: No Transportation Needs (06/17/2024)  Utilities: Not At Risk (06/17/2024)  Depression (PHQ2-9): Low Risk (05/01/2024)  Social Connections: Socially Isolated (06/17/2024)  Tobacco Use: Low Risk (06/16/2024)    Readmission Risk Interventions     No data to display

## 2024-06-29 DIAGNOSIS — F02C0 Dementia in other diseases classified elsewhere, severe, without behavioral disturbance, psychotic disturbance, mood disturbance, and anxiety: Secondary | ICD-10-CM | POA: Diagnosis not present

## 2024-06-29 DIAGNOSIS — G309 Alzheimer's disease, unspecified: Secondary | ICD-10-CM | POA: Diagnosis not present

## 2024-06-29 DIAGNOSIS — S72141G Displaced intertrochanteric fracture of right femur, subsequent encounter for closed fracture with delayed healing: Secondary | ICD-10-CM | POA: Diagnosis not present

## 2024-06-29 DIAGNOSIS — S065XAA Traumatic subdural hemorrhage with loss of consciousness status unknown, initial encounter: Secondary | ICD-10-CM | POA: Diagnosis not present

## 2024-06-29 DIAGNOSIS — S72041A Displaced fracture of base of neck of right femur, initial encounter for closed fracture: Secondary | ICD-10-CM | POA: Diagnosis not present

## 2024-06-29 DIAGNOSIS — S32010A Wedge compression fracture of first lumbar vertebra, initial encounter for closed fracture: Secondary | ICD-10-CM | POA: Diagnosis not present

## 2024-06-29 DIAGNOSIS — K219 Gastro-esophageal reflux disease without esophagitis: Secondary | ICD-10-CM | POA: Diagnosis not present

## 2024-06-29 LAB — COMPREHENSIVE METABOLIC PANEL WITH GFR
ALT: 12 U/L (ref 0–44)
AST: 29 U/L (ref 15–41)
Albumin: 2.7 g/dL — ABNORMAL LOW (ref 3.5–5.0)
Alkaline Phosphatase: 162 U/L — ABNORMAL HIGH (ref 38–126)
Anion gap: 7 (ref 5–15)
BUN: 13 mg/dL (ref 8–23)
CO2: 26 mmol/L (ref 22–32)
Calcium: 8.4 mg/dL — ABNORMAL LOW (ref 8.9–10.3)
Chloride: 107 mmol/L (ref 98–111)
Creatinine, Ser: 0.52 mg/dL (ref 0.44–1.00)
GFR, Estimated: >60 mL/min
Glucose, Bld: 93 mg/dL (ref 70–99)
Potassium: 4.3 mmol/L (ref 3.5–5.1)
Sodium: 140 mmol/L (ref 135–145)
Total Bilirubin: 0.3 mg/dL (ref 0.0–1.2)
Total Protein: 5.2 g/dL — ABNORMAL LOW (ref 6.5–8.1)

## 2024-06-29 LAB — CBC
HCT: 25.9 % — ABNORMAL LOW (ref 36.0–46.0)
Hemoglobin: 8.4 g/dL — ABNORMAL LOW (ref 12.0–15.0)
MCH: 31.7 pg (ref 26.0–34.0)
MCHC: 32.4 g/dL (ref 30.0–36.0)
MCV: 97.7 fL (ref 80.0–100.0)
Platelets: 589 K/uL — ABNORMAL HIGH (ref 150–400)
RBC: 2.65 MIL/uL — ABNORMAL LOW (ref 3.87–5.11)
RDW: 14.5 % (ref 11.5–15.5)
WBC: 7.7 K/uL (ref 4.0–10.5)
nRBC: 0 % (ref 0.0–0.2)

## 2024-06-29 LAB — MAGNESIUM: Magnesium: 2.3 mg/dL (ref 1.7–2.4)

## 2024-06-29 NOTE — Plan of Care (Signed)

## 2024-06-29 NOTE — Progress Notes (Signed)
 " PROGRESS NOTE  Courtney Avila FMW:[id number] DOB: 1940/12/12   PCP: [name], NP  Patient is from: Home.  DOA: 06/15/2024 LOS: 13  Chief complaints Chief Complaint  Patient presents with   uti     Brief Narrative / Interim history: 83 year old with hypertension, dementia presented with unwitnessed fall, found to have subdural hematoma and right hip fracture.  Underwent right hip arthroplasty 12/12.  Neurosurgery recommended conservative management for subdural. Not progressing well.  Insurance denied SNF.  Palliative medicine involved.  Subjective: Seen and examined earlier this morning.  No major events overnight or this morning.  No complaints but not a great historian.  She is awake and alert but only oriented to self.   Assessment and plan: Closed right hip fracture: Traumatic due to fall. -S/p Total hip arthroplasty 12/12, Dr. Vernetta -Aspirin  81 mg twice daily for DVT prophylaxis -Multimodal pain control with scheduled Tylenol  and as needed p.o. morphine  and IV Dilaudid . -Weightbearing as tolerated -Therapy recommended SNF.   Acute blood loss anemia due to surgical blood loss: Received blood transfusions.  Now stable. Recent Labs    07/12/23 1605 01/24/24 1540 06/15/24 1942 06/16/24 0918 06/17/24 1318 06/17/24 1539 06/18/24 0236 06/19/24 1516 06/29/24 0404  HGB 11.9 10.8* 10.5* 9.1* 6.1* 5.9* 9.2* 9.4* 8.4*  - Continue monitoring  Subdural hematoma: Repeat CT on 12/12 with stable mixed density left SDH - NSGY recommended conservative management.  Positive blood culture: Blood culture on 12/1 with Staph hominis and epidermidis in 1 out of 2 bottles likely contaminant.  Repeat blood culture on 12/13 negative.   Dementia with behavioral disturbances: LOC severe.  Awake but only oriented to self.   - Reorientation and delirium precaution  - Restoril , BuSpar  and Paxil . - Palliative consulted for goal of care.  Failure to thrive There is no height  or weight on file to calculate BMI. - Palliative consulted for goal of care        Pressure skin injury Wound 06/18/24 1820 Pressure Injury Buttocks Right;Medial;Mid Deep Tissue Pressure Injury - Purple or maroon localized area of discolored intact skin or blood-filled blister due to damage of underlying soft tissue from pressure and/or shear. (Active)     Wound 06/18/24 1857 Pressure Injury Heel Right Stage 1 -  Intact skin with non-blanchable redness of a localized area usually over a bony prominence. (Active)   DVT prophylaxis:  SCDs Start: 06/16/24 2152 SCDs Start: 06/16/24 0734  Code Status: DNR Family Communication: None at bedside Level of care: Med-Surg Status is: Inpatient Remains inpatient appropriate because: Lack of safe disposition   Final disposition: SNF?   35 minutes with more than 50% spent in reviewing records, counseling patient/family and coordinating care.  Consultants:  Orthopedic surgery Palliative medicine  Procedures: 12/12-right THA  Microbiology summarized: 12/11-COVID-19, influenza and RSV PCR nonreactive 12/11-blood culture with Staph hominis and epidermidis in 1 out of 2 bottles. 12/13-repeat blood cultures NGTD.  Objective: Vitals:   06/28/24 0615 06/28/24 1342 06/28/24 1932 06/29/24 0446  BP: 103/85 (!) 109/92 102/72 101/74  Pulse: 63 69 70 64  Resp: 20 19 18 16   Temp: 98 F (36.7 C) 98 F (36.7 C) 99 F (37.2 C) 97.9 F (36.6 C)  TempSrc:  Oral Oral   SpO2: 100% 97% 100% 99%    Examination:  GENERAL: No apparent distress.  Appears frail. HEENT: MMM.  Vision and hearing grossly intact.  NECK: Supple.  No apparent JVD.  RESP:  No IWOB.  Fair aeration  bilaterally. CVS:  RRR. Heart sounds normal.  ABD/GI/GU: BS+. Abd soft, NTND.  MSK/EXT:  Moves extremities.  Significant muscle mass and subcu fat loss. SKIN: Dressing over right hip DTI. NEURO: Awake.  Oriented to self.  No apparent focal neuro deficit. PSYCH: Calm. Normal  affect.   Sch Meds:  Scheduled Meds:  acetaminophen   1,000 mg Oral Q8H   aspirin   81 mg Oral BID   busPIRone   10 mg Oral BID   docusate sodium   100 mg Oral BID   PARoxetine   40 mg Oral QHS   temazepam   30 mg Oral QHS   Continuous Infusions: PRN Meds:.acetaminophen  **OR** acetaminophen , HYDROmorphone  (DILAUDID ) injection, menthol  **OR** phenol, methocarbamol  **OR** [DISCONTINUED] methocarbamol  (ROBAXIN ) injection, morphine , ondansetron  **OR** ondansetron  (ZOFRAN ) IV  Antimicrobials: Anti-infectives (From admission, onward)    Start     Dose/Rate Route Frequency Ordered Stop   06/22/24 1000  sulfamethoxazole -trimethoprim  (BACTRIM  DS) 800-160 MG per tablet 1 tablet  Status:  Discontinued        1 tablet Oral Every 12 hours 06/22/24 0859 06/24/24 1147   06/19/24 2200  sulfamethoxazole -trimethoprim  (BACTRIM  DS) 800-160 MG per tablet 1 tablet  Status:  Discontinued        1 tablet Oral Every 12 hours 06/19/24 1706 06/22/24 0859   06/19/24 0000  sulfamethoxazole -trimethoprim  (BACTRIM  DS) 800-160 MG tablet  Status:  Discontinued        1 tablet Oral Every 12 hours 06/19/24 1707 06/24/24    06/16/24 2245  ceFAZolin  (ANCEF ) IVPB 2g/100 mL premix        2 g 200 mL/hr over 30 Minutes Intravenous Every 6 hours 06/16/24 2152 06/17/24 1406   06/16/24 1245  ceFAZolin  (ANCEF ) IVPB 2g/100 mL premix        2 g 200 mL/hr over 30 Minutes Intravenous On call to O.R. 06/16/24 1230 06/16/24 1450   06/16/24 1231  ceFAZolin  (ANCEF ) 2-4 GM/100ML-% IVPB       Note to Pharmacy: [name] D: cabinet override      06/16/24 1231 06/16/24 1458   06/16/24 1000  cefTRIAXone  (ROCEPHIN ) 1 g in sodium chloride  0.9 % 100 mL IVPB        1 g 200 mL/hr over 30 Minutes Intravenous Every 24 hours 06/16/24 0938 06/18/24 1238        I have personally reviewed the following labs and images: CBC: Recent Labs  Lab 06/29/24 0404  WBC 7.7  HGB 8.4*  HCT 25.9*  MCV 97.7  PLT 589*   BMP &GFR Recent Labs   Lab 06/29/24 0404  NA 140  K 4.3  CL 107  CO2 26  GLUCOSE 93  BUN 13  CREATININE 0.52  CALCIUM 8.4*  MG 2.3   CrCl cannot be calculated (Unknown ideal weight.). Liver & Pancreas: Recent Labs  Lab 06/29/24 0404  AST 29  ALT 12  ALKPHOS 162*  BILITOT 0.3  PROT 5.2*  ALBUMIN  2.7*   No results for input(s): LIPASE, AMYLASE in the last 168 hours. No results for input(s): AMMONIA in the last 168 hours. Diabetic: No results for input(s): HGBA1C in the last 72 hours. No results for input(s): GLUCAP in the last 168 hours. Cardiac Enzymes: No results for input(s): CKTOTAL, CKMB, CKMBINDEX, TROPONINI in the last 168 hours. No results for input(s): PROBNP in the last 8760 hours. Coagulation Profile: No results for input(s): INR, PROTIME in the last 168 hours. Thyroid  Function Tests: No results for input(s): TSH, T4TOTAL, FREET4, T3FREE, THYROIDAB in the last 72  hours. Lipid Profile: No results for input(s): CHOL, HDL, LDLCALC, TRIG, CHOLHDL, LDLDIRECT in the last 72 hours. Anemia Panel: No results for input(s): VITAMINB12, FOLATE, FERRITIN, TIBC, IRON, RETICCTPCT in the last 72 hours. Urine analysis:    Component Value Date/Time   COLORURINE YELLOW 06/15/2024 2033   APPEARANCEUR HAZY (A) 06/15/2024 2033   LABSPEC 1.029 06/15/2024 2033   PHURINE 5.0 06/15/2024 2033   GLUCOSEU NEGATIVE 06/15/2024 2033   HGBUR NEGATIVE 06/15/2024 2033   BILIRUBINUR NEGATIVE 06/15/2024 2033   KETONESUR NEGATIVE 06/15/2024 2033   PROTEINUR 30 (A) 06/15/2024 2033   NITRITE NEGATIVE 06/15/2024 2033   LEUKOCYTESUR NEGATIVE 06/15/2024 2033   Sepsis Labs: Invalid input(s): PROCALCITONIN, LACTICIDVEN  Microbiology: No results found for this or any previous visit (from the past 240 hours).  Radiology Studies: No results found.    Courtney Avila T. Sebastien Jackson [facility]  If 7PM-7AM, please contact  night-coverage [email or web address] 06/29/2024, 12:57 PM   "

## 2024-06-29 NOTE — Consult Note (Signed)
 "                                                                                   Consultation Note Date: 06/29/2024   Patient Name: Courtney Avila  DOB: December 10, 1940  MRN: [id number]  Age / Sex: 83 y.o., female  PCP: [name], NP Referring Physician: Kathrin Mignon DASEN, MD  Reason for Consultation: Establishing goals of care  HPI/Patient Profile: 83 y.o. female  admitted on 06/15/2024   Clinical Assessment and Goals of Care: 83 year old lady with hypertension, dementia had an unwitnessed fall, had subdural hematoma right hip fracture brought to the hospital underwent right hip arthroplasty on 12-12, was recommended conservative measures for subdural.  Patient with escalating symptom burden, worsening cognitive and functional status in this hospital stay thus far, insurance denied SNF. Palliative care consult for ongoing goals of care discussions has been requested Chart reviewed Patient seen Discussed with bedside RN PT notes reviewed Call placed but unable to reach son Courtney Avila will reattempt on 06-30-2024.  Palliative medicine is specialized medical care for people living with serious illness. It focuses on providing relief from the symptoms and stress of a serious illness. The goal is to improve quality of life for both the patient and the family. Goals of care: Broad aims of medical therapy in relation to the patient's values and preferences. Our aim is to provide medical care aimed at enabling patients to achieve the goals that matter most to them, given the circumstances of their particular medical situation and their constraints.     NEXT OF KIN Son Courtney Avila   SUMMARY OF RECOMMENDATIONS   Palliative care recommendations: Patient is a elderly lady with dementia, had a fall and subdural and a right hip fracture for which she underwent surgery.  However hospital course has been complicated by ongoing functional and cognitive decline.  Minimal oral intake.  Denied by SNF.  Ongoing  efforts at pain management and physical therapy are underway.  Patient also being followed by orthopedic service.  Patient will benefit from addition of hospice services at this time.  Home with hospice versus residential hospice could be considered.  Unfortunately, unable to get in touch with son at this time, palliative care will follow-up again on 06-30-2024 Continue current mode of care Further recommendations to follow Thank you for the consult  Code Status/Advance Care Planning: DNR   Symptom Management:     Palliative Prophylaxis:  Delirium Protocol    Psycho-social/Spiritual:  Desire for further Chaplaincy support:yes Additional Recommendations: Education on Hospice  Prognosis:  guarded  Discharge Planning: To Be Determined      Primary Diagnoses: Present on Admission:  OA (osteoarthritis) of knee  Dementia (HCC)  GERD (gastroesophageal reflux disease)  Closed compression fracture of body of L1 vertebra (HCC)  Subdural hematoma (HCC)   I have reviewed the medical record, interviewed the patient and family, and examined the patient. The following aspects are pertinent.  Past Medical History:  Diagnosis Date   Allergy    Anxiety    Arthritis    Dementia (HCC)    GERD (gastroesophageal reflux disease)    Social History   Socioeconomic History  Marital status: Divorced    Spouse name: Not on file   Number of children: Not on file   Years of education: Not on file   Highest education level: Not on file  Occupational History   Not on file  Tobacco Use   Smoking status: Never   Smokeless tobacco: Never  Vaping Use   Vaping status: Never Used  Substance and Sexual Activity   Alcohol use: Never   Drug use: Never   Sexual activity: Not Currently  Other Topics Concern   Not on file  Social History Narrative   ** Merged History Encounter **       Right handed Lives with her son [name] Two story home Drinks no caffeine retired   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   Tobacco Use: Low Risk (06/16/2024)   Patient History    Smoking Tobacco Use: Never    Smokeless Tobacco Use: Never    Passive Exposure: Not on file  Financial Resource Strain: Not on file  Food Insecurity: No Food Insecurity (06/17/2024)   Epic    Worried About Programme Researcher, Broadcasting/film/video in the Last Year: Never true    Ran Out of Food in the Last Year: Never true  Transportation Needs: No Transportation Needs (06/17/2024)   Epic    Lack of Transportation (Medical): No    Lack of Transportation (Non-Medical): No  Physical Activity: Not on file  Stress: Not on file  Social Connections: Socially Isolated (06/17/2024)   Social Connection and Isolation Panel    Frequency of Communication with Friends and Family: Never    Frequency of Social Gatherings with Friends and Family: Never    Attends Religious Services: Never    Database Administrator or Organizations: No    Attends Banker Meetings: Never    Marital Status: Widowed  Depression (PHQ2-9): Low Risk (05/01/2024)   Depression (PHQ2-9)    PHQ-2 Score: 0  Alcohol Screen: Not on file  Housing: Low Risk (06/27/2024)   Epic    Unable to Pay for Housing in the Last Year: No    Number of Times Moved in the Last Year: 0    Homeless in the Last Year: No  Utilities: Not At Risk (06/17/2024)   Epic    Threatened with loss of utilities: No  Health Literacy: Not on file   Family History  Problem Relation Age of Onset   Diabetes Mother    Hypertension Mother    Heart disease Father    Colon cancer Father    Breast cancer Sister    Scheduled Meds:  acetaminophen   1,000 mg Oral Q8H   aspirin   81 mg Oral BID   busPIRone   10 mg Oral BID   docusate sodium   100 mg Oral BID   PARoxetine   40 mg Oral QHS   temazepam   30 mg Oral QHS   Continuous Infusions: PRN Meds:.acetaminophen  **OR** acetaminophen , HYDROmorphone  (DILAUDID ) injection, menthol  **OR** phenol, methocarbamol  **OR** [DISCONTINUED] methocarbamol   (ROBAXIN ) injection, morphine , ondansetron  **OR** ondansetron  (ZOFRAN ) IV Medications Prior to Admission:  Prior to Admission medications  Medication Sig Start Date End Date Taking? Authorizing Provider  busPIRone  (BUSPAR ) 10 MG tablet Take 1 tablet (10 mg total) by mouth 2 (two) times daily. 09/21/22  Yes [name], NP  cholecalciferol (VITAMIN D3) 25 MCG (1000 UNIT) tablet Take 1,000 Units by mouth daily.   Yes [provider]  [employer] OMEGA-3 KRILL OIL PO Take 1 tablet by mouth daily.  Yes [provider]  Ensure (ENSURE) Take 237 mLs by mouth daily.   Yes [provider]  PARoxetine  (PAXIL ) 40 MG tablet Take 40 mg by mouth at bedtime. 09/30/22  Yes [provider]  prazosin  (MINIPRESS ) 1 MG capsule Take 1 mg by mouth at bedtime.   Yes [provider]  TYLENOL  500 MG tablet Take 500 mg by mouth every 6 (six) hours as needed for mild pain or headache.   Yes [provider]  ARIPiprazole  (ABILIFY ) 2 MG tablet Take 2 mg by mouth at bedtime. Patient not taking: Reported on 06/16/2024 01/01/24   [provider]  aspirin  81 MG chewable tablet Chew 1 tablet (81 mg total) by mouth 2 (two) times daily. 06/19/24   Vernetta Lonni GRADE, MD  temazepam  (RESTORIL ) 30 MG capsule Take 1 capsule (30 mg total) by mouth at bedtime. 06/19/24   Odell Celinda Balo, MD  traMADol  (ULTRAM ) 50 MG tablet Take 1 tablet (50 mg total) by mouth every 6 (six) hours as needed for moderate pain (pain score 4-6). 06/19/24   Odell Celinda Balo, MD   Allergies[1] Review of Systems Mumbles incoherently Physical Exam Appears frail and weak Mumbles incoherently Displays nonverbal gestures of distress and discomfort Regular work of breathing Muscle wasting evident  Vital Signs: BP 100/75   Pulse 70   Temp 97.8 F (36.6 C)   Resp 15   SpO2 99%  Pain Scale: Faces POSS *See Group Information*: S-Acceptable,Sleep, easy to arouse Pain Score:  Asleep   SpO2: SpO2: 99 % O2 Device:SpO2: 99 % O2 Flow Rate: .O2 Flow Rate (L/min): 3 L/min  IO: Intake/output summary:  Intake/Output Summary (Last 24 hours) at 06/29/2024 1441 Last data filed at 06/29/2024 1100 Gross per 24 hour  Intake 100 ml  Output 400 ml  Net -300 ml    LBM: Last BM Date : (P) 06/25/24 Baseline Weight:   Most recent weight:       Palliative Assessment/Data:   PPS 30%  Time In:  11 Time Out:  12 Time Total:  60 Greater than 50%  of this time was spent counseling and coordinating care related to the above assessment and plan.  Signed by: Lonia Serve, MD   Please contact Palliative Medicine Team phone at [phone] for questions and concerns.  For individual provider: See Amion                 [1] No Known Allergies  "

## 2024-06-29 NOTE — Progress Notes (Signed)
 Report called to 5th floor charge RN pt will be transferred to 1522 after shift change. Notifying son [name] of transfer

## 2024-06-30 DIAGNOSIS — K219 Gastro-esophageal reflux disease without esophagitis: Secondary | ICD-10-CM | POA: Diagnosis not present

## 2024-06-30 DIAGNOSIS — S72041A Displaced fracture of base of neck of right femur, initial encounter for closed fracture: Secondary | ICD-10-CM | POA: Diagnosis not present

## 2024-06-30 DIAGNOSIS — G309 Alzheimer's disease, unspecified: Secondary | ICD-10-CM | POA: Diagnosis not present

## 2024-06-30 DIAGNOSIS — S065XAA Traumatic subdural hemorrhage with loss of consciousness status unknown, initial encounter: Secondary | ICD-10-CM | POA: Diagnosis not present

## 2024-06-30 NOTE — Progress Notes (Signed)
 "                                                                                                                                                                                                          Daily Progress Note   Patient Name: Courtney Avila       Date: 06/30/2024 DOB: 16-Dec-1940  Age: 83 y.o. MRN#: [id number] Attending Physician: Kathrin Mignon DASEN, MD Primary Care Physician: [name], NP Admit Date: 06/15/2024  Reason for Consultation/Follow-up: Establishing goals of care  Subjective:  Appears frail and weak.  Mumbles incoherently.   Length of Stay: 14  Current Medications: Scheduled Meds:   acetaminophen   1,000 mg Oral Q8H   aspirin   81 mg Oral BID   busPIRone   10 mg Oral BID   docusate sodium   100 mg Oral BID   PARoxetine   40 mg Oral QHS   temazepam   30 mg Oral QHS    Continuous Infusions:   PRN Meds: acetaminophen  **OR** acetaminophen , HYDROmorphone  (DILAUDID ) injection, menthol  **OR** phenol, methocarbamol  **OR** [DISCONTINUED] methocarbamol  (ROBAXIN ) injection, morphine , ondansetron  **OR** ondansetron  (ZOFRAN ) IV  Physical Exam         Appears frail and weak Has significant muscle loss Has dressing over R hip Awake but not alert   Vital Signs: BP (!) 106/58   Pulse 63   Temp (!) 97.5 F (36.4 C) (Oral)   Resp 19   SpO2 98%  SpO2: SpO2: 98 % O2 Device: O2 Device: Room Air O2 Flow Rate: O2 Flow Rate (L/min): 3 L/min  Intake/output summary:  Intake/Output Summary (Last 24 hours) at 06/30/2024 1321 Last data filed at 06/30/2024 1300 Gross per 24 hour  Intake 360 ml  Output 425 ml  Net -65 ml   LBM: Last BM Date : 06/25/24 Baseline Weight:   Most recent weight:         Palliative Assessment/Data:      Patient Active Problem List   Diagnosis Date Noted   Dementia (HCC) 06/16/2024   GERD (gastroesophageal reflux disease) 06/16/2024   Closed 2-part intertrochanteric fracture of proximal end of femur with delayed healing, right  06/16/2024   Closed compression fracture of body of L1 vertebra (HCC) 06/16/2024   Subdural hematoma (HCC) 06/16/2024   Displaced fracture of base of neck of right femur, initial encounter for closed fracture (HCC) 06/16/2024   OA (osteoarthritis) of knee 12/07/2016   S/P reverse total shoulder arthroplasty, 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  Patient with escalating symptom burden, worsening cognitive and functional status in this hospital stay thus far, insurance denied SNF. Palliative care consult for ongoing goals of care discussions has been requested  Recommendations/Plan: Continue current pain medications and conservative management.  Recommend residential hospice.     Code Status:    Code Status Orders  (From admission, onward)           Start     Ordered   06/16/24 0049  Do not attempt resuscitation (DNR)- Limited -Do Not Intubate (DNI)  Continuous       Question Answer Comment  If pulseless and not breathing No CPR or chest compressions.   In Pre-Arrest Conditions (Patient Is Breathing and Has A Pulse) Do not intubate. Provide all appropriate non-invasive medical interventions. Avoid ICU transfer unless indicated or required.   Consent: Discussion documented in EHR or advanced directives reviewed      06/16/24 0049           Code Status History     Date Active Date Inactive Code Status Order ID Comments User Context   01/24/2024 1529 06/15/2024 1835 DNR [id number]  [name], NP Outpatient   12/07/2016 1636 12/09/2016 1730 Full Code [id number]  Melodi Lerner, MD Inpatient   04/09/2016 1215 04/10/2016 2031 Full Code [id number]  Garland Sora, PA-C Inpatient        Prognosis:  < 2 weeks  Discharge Planning: Hospice facility Is recommended, but not able to get in touch with son [name].   Care plan was discussed with  IDT Dr Kathrin.   Thank you for allowing the Palliative Medicine Team to assist in the care of this patient. Mod MDM.      Greater than 50%  of this time was spent counseling and coordinating care related to the above assessment and plan.  Lonia Serve, MD  Please contact Palliative Medicine Team phone at [phone] for questions and concerns.       "

## 2024-06-30 NOTE — Plan of Care (Signed)

## 2024-06-30 NOTE — Progress Notes (Signed)
 " PROGRESS NOTE  Courtney Avila FMW:[id number] DOB: 06-28-1941   PCP: [name], NP  Patient is from: Home.  DOA: 06/15/2024 LOS: 14  Chief complaints Chief Complaint  Patient presents with   uti     Brief Narrative / Interim history: 83 year old with hypertension, dementia presented with unwitnessed fall, found to have subdural hematoma and right hip fracture.  Underwent right hip arthroplasty 12/12.  Neurosurgery recommended conservative management for subdural. Not progressing well.  Insurance denied SNF.  She has failure to thrive.  Palliative medicine following.  Subjective: Seen and examined earlier this morning.  No major events overnight or this morning.  No complaints but not a great historian.  Not able to tell me her name today.  Looks weak and frail.   Assessment and plan: Closed right hip fracture: Traumatic due to fall. -S/p Total hip arthroplasty 12/12, Dr. Vernetta -Aspirin  81 mg twice daily for DVT prophylaxis -Multimodal pain control with scheduled Tylenol  and as needed p.o. morphine  and IV Dilaudid . -Weightbearing as tolerated -Therapy recommended SNF.   Acute blood loss anemia due to surgical blood loss: Received blood transfusions.  Relatively stable. Recent Labs    07/12/23 1605 01/24/24 1540 06/15/24 1942 06/16/24 0918 06/17/24 1318 06/17/24 1539 06/18/24 0236 06/19/24 1516 06/29/24 0404  HGB 11.9 10.8* 10.5* 9.1* 6.1* 5.9* 9.2* 9.4* 8.4*  - Continue monitoring  Subdural hematoma: Repeat CT on 12/12 with stable mixed density left SDH - NSGY recommended conservative management.  Positive blood culture: Blood culture on 12/1 with Staph hominis and epidermidis in 1 out of 2 bottles likely contaminant.  Repeat blood culture on 12/13 negative.   Dementia with behavioral disturbances: LOC severe.  Awake but only oriented to self.   - Reorientation and delirium precaution  - Restoril , BuSpar  and Paxil . - Palliative consulted for goal of  care.  Failure to thrive: Gradually declining. There is no height or weight on file to calculate BMI. - Palliative consulted for goal of care        Pressure skin injury Wound 06/18/24 1820 Pressure Injury Buttocks Right;Medial;Mid Deep Tissue Pressure Injury - Purple or maroon localized area of discolored intact skin or blood-filled blister due to damage of underlying soft tissue from pressure and/or shear. (Active)     Wound 06/18/24 1857 Pressure Injury Heel Right Stage 1 -  Intact skin with non-blanchable redness of a localized area usually over a bony prominence. (Active)   DVT prophylaxis:  SCDs Start: 06/16/24 2152 SCDs Start: 06/16/24 0734  Code Status: DNR Family Communication: None at bedside Level of care: Med-Surg Status is: Inpatient Remains inpatient appropriate because: Lack of safe disposition   Final disposition: SNF?   35 minutes with more than 50% spent in reviewing records, counseling patient/family and coordinating care.  Consultants:  Orthopedic surgery Palliative medicine  Procedures: 12/12-right THA  Microbiology summarized: 12/11-COVID-19, influenza and RSV PCR nonreactive 12/11-blood culture with Staph hominis and epidermidis in 1 out of 2 bottles. 12/13-repeat blood cultures NGTD.  Objective: Vitals:   06/29/24 0446 06/29/24 1332 06/29/24 1951 06/30/24 0527  BP: 101/74 100/75 119/61 (!) 106/58  Pulse: 64 70 72 63  Resp: 16 15 17 19   Temp: 97.9 F (36.6 C) 97.8 F (36.6 C) 98 F (36.7 C) (!) 97.5 F (36.4 C)  TempSrc:   Oral Oral  SpO2: 99%  98% 98%    Examination:  GENERAL: No apparent distress.  Appears frail and weak. HEENT: MMM.  Vision and hearing grossly intact.  NECK: Supple.  No apparent JVD.  RESP:  No IWOB.  Fair aeration bilaterally. CVS:  RRR.  2/6 SEM over RUSB and LUSB. ABD/GI/GU: BS+. Abd soft, NTND.  MSK/EXT:  Moves extremities.  Significant muscle mass and subcu fat loss. SKIN: Dressing over right hip  DTI. NEURO: Awake.  Not able to answer orientation questions.  No apparent focal neuro deficit. PSYCH: Calm. Normal affect.   Sch Meds:  Scheduled Meds:  acetaminophen   1,000 mg Oral Q8H   aspirin   81 mg Oral BID   busPIRone   10 mg Oral BID   docusate sodium   100 mg Oral BID   PARoxetine   40 mg Oral QHS   temazepam   30 mg Oral QHS   Continuous Infusions: PRN Meds:.acetaminophen  **OR** acetaminophen , HYDROmorphone  (DILAUDID ) injection, menthol  **OR** phenol, methocarbamol  **OR** [DISCONTINUED] methocarbamol  (ROBAXIN ) injection, morphine , ondansetron  **OR** ondansetron  (ZOFRAN ) IV  Antimicrobials: Anti-infectives (From admission, onward)    Start     Dose/Rate Route Frequency Ordered Stop   06/22/24 1000  sulfamethoxazole -trimethoprim  (BACTRIM  DS) 800-160 MG per tablet 1 tablet  Status:  Discontinued        1 tablet Oral Every 12 hours 06/22/24 0859 06/24/24 1147   06/19/24 2200  sulfamethoxazole -trimethoprim  (BACTRIM  DS) 800-160 MG per tablet 1 tablet  Status:  Discontinued        1 tablet Oral Every 12 hours 06/19/24 1706 06/22/24 0859   06/19/24 0000  sulfamethoxazole -trimethoprim  (BACTRIM  DS) 800-160 MG tablet  Status:  Discontinued        1 tablet Oral Every 12 hours 06/19/24 1707 06/24/24    06/16/24 2245  ceFAZolin  (ANCEF ) IVPB 2g/100 mL premix        2 g 200 mL/hr over 30 Minutes Intravenous Every 6 hours 06/16/24 2152 06/17/24 1406   06/16/24 1245  ceFAZolin  (ANCEF ) IVPB 2g/100 mL premix        2 g 200 mL/hr over 30 Minutes Intravenous On call to O.R. 06/16/24 1230 06/16/24 1450   06/16/24 1231  ceFAZolin  (ANCEF ) 2-4 GM/100ML-% IVPB       Note to Pharmacy: [name] D: cabinet override      06/16/24 1231 06/16/24 1458   06/16/24 1000  cefTRIAXone  (ROCEPHIN ) 1 g in sodium chloride  0.9 % 100 mL IVPB        1 g 200 mL/hr over 30 Minutes Intravenous Every 24 hours 06/16/24 0938 06/18/24 1238        I have personally reviewed the following labs and  images: CBC: Recent Labs  Lab 06/29/24 0404  WBC 7.7  HGB 8.4*  HCT 25.9*  MCV 97.7  PLT 589*   BMP &GFR Recent Labs  Lab 06/29/24 0404  NA 140  K 4.3  CL 107  CO2 26  GLUCOSE 93  BUN 13  CREATININE 0.52  CALCIUM 8.4*  MG 2.3   CrCl cannot be calculated (Unknown ideal weight.). Liver & Pancreas: Recent Labs  Lab 06/29/24 0404  AST 29  ALT 12  ALKPHOS 162*  BILITOT 0.3  PROT 5.2*  ALBUMIN  2.7*   No results for input(s): LIPASE, AMYLASE in the last 168 hours. No results for input(s): AMMONIA in the last 168 hours. Diabetic: No results for input(s): HGBA1C in the last 72 hours. No results for input(s): GLUCAP in the last 168 hours. Cardiac Enzymes: No results for input(s): CKTOTAL, CKMB, CKMBINDEX, TROPONINI in the last 168 hours. No results for input(s): PROBNP in the last 8760 hours. Coagulation Profile: No results for input(s): INR, PROTIME in  the last 168 hours. Thyroid  Function Tests: No results for input(s): TSH, T4TOTAL, FREET4, T3FREE, THYROIDAB in the last 72 hours. Lipid Profile: No results for input(s): CHOL, HDL, LDLCALC, TRIG, CHOLHDL, LDLDIRECT in the last 72 hours. Anemia Panel: No results for input(s): VITAMINB12, FOLATE, FERRITIN, TIBC, IRON, RETICCTPCT in the last 72 hours. Urine analysis:    Component Value Date/Time   COLORURINE YELLOW 06/15/2024 2033   APPEARANCEUR HAZY (A) 06/15/2024 2033   LABSPEC 1.029 06/15/2024 2033   PHURINE 5.0 06/15/2024 2033   GLUCOSEU NEGATIVE 06/15/2024 2033   HGBUR NEGATIVE 06/15/2024 2033   BILIRUBINUR NEGATIVE 06/15/2024 2033   KETONESUR NEGATIVE 06/15/2024 2033   PROTEINUR 30 (A) 06/15/2024 2033   NITRITE NEGATIVE 06/15/2024 2033   LEUKOCYTESUR NEGATIVE 06/15/2024 2033   Sepsis Labs: Invalid input(s): PROCALCITONIN, LACTICIDVEN  Microbiology: No results found for this or any previous visit (from the past 240 hours).  Radiology  Studies: No results found.    Alexi Dorminey T. Emilea Goga [facility]  If 7PM-7AM, please contact night-coverage [email or web address] 06/30/2024, 1:22 PM   "

## 2024-06-30 NOTE — Progress Notes (Signed)
 Physical Therapy Treatment Patient Details Name: Courtney Avila MRN: [id number] DOB: 03-12-1941 Today's Date: 06/30/2024   History of Present Illness 83 y.o. female was brought by EMS from home with concern for possible UTI by family. Patient apparently has had generalized pain since she had a fall 2 days ago. She is also having pelvic pain that the family thought was possibly UTI. She is normally ambulatory and can communicate despite advanced dementia. She had a fall 2 prior to admission and since that she has been less ambulatory. on Evaluation in ED pt was determined to have a 7 mm subdural hematoma,  right intertrochanteric fracture,  and and L1 compression fracture all presumed to be from the fall. Orthopedics and neurosurgery were both consulted PMH: Alzheimer's dementia, osteoarthritis, GERD, anxiety disorder, hypertension   was brought by EMS from home with concern for possible UTI by family.   Orthopedics and neurosurgery were both consulted. pt is s/p R DA THA    PT Comments   Patient received,  lower body rotated to the right and legs maximally flexed in fetal position. Total assistance to  slide to  sitting using bed pad, patient yelling out , extremely anxious, Assisted back to supine, Legs remained tightly flexed. Unable to position to facilitate extension.   Granddaughter came in and reports patient had been ambulatory  prior to fall .   Patient will benefit from continued inpatient follow up therapy, <3 hours/day.     If plan is discharge home, recommend the following: Two people to help with bathing/dressing/bathroom;Help with stairs or ramp for entrance;Assist for transportation;Two people to help with walking and/or transfers   Can travel by private vehicle        Equipment Recommendations  Other (comment)    Recommendations for Other Services       Precautions / Restrictions Precautions Precaution/Restrictions Comments: L>R kegs flexed and dooes not tolerate any  extension attempts, KI DCd     Mobility  Bed Mobility   Bed Mobility: Supine to Sit, Sit to Supine     Supine to sit: Total assist, +2 for physical assistance, +2 for safety/equipment, HOB elevated Sit to supine: +2 for physical assistance, Total assist, +2 for safety/equipment   General bed mobility comments: total A x 2 for supine <> sit with use of bed pad and hospital bed features to spin to sitting, patient yelling out so returned to supine    Transfers                   General transfer comment: NT 2* pain, anxiety, poor sitting balance    Ambulation/Gait                   Stairs             Wheelchair Mobility     Tilt Bed    Modified Rankin (Stroke Patients Only)       Balance Overall balance assessment: Needs assistance Sitting-balance support: Bilateral upper extremity supported, Feet unsupported Sitting balance-Leahy Scale: Zero                                      Communication Communication Communication: Impaired Factors Affecting Communication: Reduced clarity of speech  Cognition Arousal: Alert Behavior During Therapy: Anxious, Restless   PT - Cognitive impairments: History of cognitive impairments  PT - Cognition Comments: dtr states patient is more coherent at times and was earlier when she was visiting Following commands: Impaired Following commands impaired: Follows one step commands inconsistently    Cueing Cueing Techniques: Verbal cues, Tactile cues, Gestural cues, Visual cues  Exercises      General Comments        Pertinent Vitals/Pain Pain Assessment Pain Assessment: PAINAD Breathing: noisy labored breathing, long periods of hyperventilation, Cheyne-Stokes respirations Negative Vocalization: repeated troubled calling out, loud moaning/groaning, crying Body Language: rigid, fists clenched, knees up, pushing/pulling away, strikes out Consolability: unable to  console, distract or reassure Pain Location: dificulty to tell which leg    Home Living                          Prior Function            PT Goals (current goals can now be found in the care plan section) Acute Rehab PT Goals PT Goal Formulation: With patient/family Time For Goal Achievement: 07/13/24 Potential to Achieve Goals: Poor Progress towards PT goals: Not progressing toward goals - comment (limited by anxiety and  reports of pain)    Frequency    Min 2X/week      PT Plan      Co-evaluation              AM-PAC PT 6 Clicks Mobility   Outcome Measure  Help needed turning from your back to your side while in a flat bed without using bedrails?: Total Help needed moving from lying on your back to sitting on the side of a flat bed without using bedrails?: Total Help needed moving to and from a bed to a chair (including a wheelchair)?: Total Help needed standing up from a chair using your arms (e.g., wheelchair or bedside chair)?: Total Help needed to walk in hospital room?: Total Help needed climbing 3-5 steps with a railing? : Total 6 Click Score: 6    End of Session   Activity Tolerance: Patient limited by pain;Other (comment) Patient left: in bed;with bed alarm set;with call bell/phone within reach;with family/visitor present Nurse Communication: Mobility status PT Visit Diagnosis: Other abnormalities of gait and mobility (R26.89);Pain;Difficulty in walking, not elsewhere classified (R26.2);Adult, failure to thrive (R62.7) Pain - Right/Left: Left Pain - part of body: Leg;Knee;Hip     Time: 1420-1440 PT Time Calculation (min) (ACUTE ONLY): 20 min  Charges:    $Therapeutic Activity: 8-22 mins PT General Charges $$ ACUTE PT VISIT: 1 Visit                     Darice Potters PT Acute Rehabilitation Services Office [phone]   Potters Darice Norris 06/30/2024, 3:14 PM

## 2024-07-01 DIAGNOSIS — G309 Alzheimer's disease, unspecified: Secondary | ICD-10-CM | POA: Diagnosis not present

## 2024-07-01 DIAGNOSIS — S065XAA Traumatic subdural hemorrhage with loss of consciousness status unknown, initial encounter: Secondary | ICD-10-CM | POA: Diagnosis not present

## 2024-07-01 DIAGNOSIS — K219 Gastro-esophageal reflux disease without esophagitis: Secondary | ICD-10-CM | POA: Diagnosis not present

## 2024-07-01 DIAGNOSIS — S72041A Displaced fracture of base of neck of right femur, initial encounter for closed fracture: Secondary | ICD-10-CM | POA: Diagnosis not present

## 2024-07-01 LAB — CBC
HCT: 26.3 % — ABNORMAL LOW (ref 36.0–46.0)
Hemoglobin: 8.4 g/dL — ABNORMAL LOW (ref 12.0–15.0)
MCH: 31.1 pg (ref 26.0–34.0)
MCHC: 31.9 g/dL (ref 30.0–36.0)
MCV: 97.4 fL (ref 80.0–100.0)
Platelets: 554 K/uL — ABNORMAL HIGH (ref 150–400)
RBC: 2.7 MIL/uL — ABNORMAL LOW (ref 3.87–5.11)
RDW: 14.7 % (ref 11.5–15.5)
WBC: 9.1 K/uL (ref 4.0–10.5)
nRBC: 0 % (ref 0.0–0.2)

## 2024-07-01 NOTE — Plan of Care (Signed)
" °  Problem: Health Behavior/Discharge Planning: Goal: Ability to manage health-related needs will improve Outcome: Progressing   Problem: Clinical Measurements: Goal: Ability to maintain clinical measurements within normal limits will improve Outcome: Progressing Goal: Will remain free from infection Outcome: Progressing Goal: Diagnostic test results will improve Outcome: Progressing Goal: Respiratory complications will improve Outcome: Progressing Goal: Cardiovascular complication will be avoided Outcome: Progressing   Problem: Activity: Goal: Risk for activity intolerance will decrease Outcome: Progressing   Problem: Nutrition: Goal: Adequate nutrition will be maintained Outcome: Progressing   Problem: Coping: Goal: Level of anxiety will decrease Outcome: Progressing   Problem: Elimination: Goal: Will not experience complications related to bowel motility Outcome: Progressing Goal: Will not experience complications related to urinary retention Outcome: Progressing   Problem: Pain Managment: Goal: General experience of comfort will improve and/or be controlled Outcome: Progressing   Problem: Safety: Goal: Ability to remain free from injury will improve Outcome: Progressing   Problem: Skin Integrity: Goal: Risk for impaired skin integrity will decrease Outcome: Progressing   Problem: Education: Goal: Knowledge of the prescribed therapeutic regimen will improve Outcome: Progressing Goal: Understanding of discharge needs will improve Outcome: Progressing Goal: Individualized Educational Video(s) Outcome: Progressing   Problem: Activity: Goal: Ability to avoid complications of mobility impairment will improve Outcome: Progressing Goal: Ability to tolerate increased activity will improve Outcome: Progressing   Problem: Pain Management: Goal: Pain level will decrease with appropriate interventions Outcome: Progressing   Problem: Skin Integrity: Goal: Will  show signs of wound healing Outcome: Progressing   "

## 2024-07-01 NOTE — Progress Notes (Signed)
 " PROGRESS NOTE  Courtney Avila FMW:[id number] DOB: [birth year]   PCP: [name], NP  Patient is from: Home.  DOA: 06/15/2024 LOS: 15  Chief complaints Chief Complaint  Patient presents with   uti     Brief Narrative / Interim history: 83 year old with hypertension, dementia presented with unwitnessed fall, found to have subdural hematoma and right hip fracture.  Underwent right hip arthroplasty 12/12.  Neurosurgery recommended conservative management for subdural. Not progressing well.  Insurance denied SNF.  Family appealed.  She has failure to thrive.  Palliative medicine following.  Subjective: Seen and examined earlier this morning.  No major events overnight or this morning.  No complaints but not a great historian.  She is more alert today.  Oriented to self.   Assessment and plan: Closed right hip fracture: Traumatic due to fall. -S/p Total hip arthroplasty 12/12, Dr. Vernetta -Aspirin  81 mg twice daily for DVT prophylaxis -Multimodal pain control with scheduled Tylenol  and as needed p.o. morphine  and IV Dilaudid . -Weightbearing as tolerated -Therapy recommended SNF.   Acute blood loss anemia due to surgical blood loss: Received blood transfusions.  Relatively stable. Recent Labs    07/12/23 1605 01/24/24 1540 06/15/24 1942 06/16/24 0918 06/17/24 1318 06/17/24 1539 06/18/24 0236 06/19/24 1516 06/29/24 0404 07/01/24 0431  HGB 11.9 10.8* 10.5* 9.1* 6.1* 5.9* 9.2* 9.4* 8.4* 8.4*  - Continue monitoring  Subdural hematoma: Repeat CT on 12/12 with stable mixed density left SDH - NSGY recommended conservative management.  Positive blood culture: Blood culture on 12/1 with Staph hominis and epidermidis in 1 out of 2 bottles likely contaminant.  Repeat blood culture on 12/13 negative.  Severe dementia with behavioral disturbances: Oriented to self for most part. - Reorientation and delirium precaution  - Restoril , BuSpar  and Paxil . - Palliative consulted  for goal of care.  Failure to thrive: Gradually declining. There is no height or weight on file to calculate BMI. - Palliative consulted for goal of care        Pressure skin injury Wound 06/18/24 1820 Pressure Injury Buttocks Right;Medial;Mid Deep Tissue Pressure Injury - Purple or maroon localized area of discolored intact skin or blood-filled blister due to damage of underlying soft tissue from pressure and/or shear. (Active)     Wound 06/18/24 1857 Pressure Injury Heel Right Stage 1 -  Intact skin with non-blanchable redness of a localized area usually over a bony prominence. (Active)   DVT prophylaxis:  SCDs Start: 06/16/24 2152 SCDs Start: 06/16/24 0734  Code Status: DNR Family Communication: None at bedside Level of care: Med-Surg Status is: Inpatient Remains inpatient appropriate because: Lack of safe disposition   Final disposition: SNF?   35 minutes with more than 50% spent in reviewing records, counseling patient/family and coordinating care.  Consultants:  Orthopedic surgery Palliative medicine  Procedures: 12/12-right THA  Microbiology summarized: 12/11-COVID-19, influenza and RSV PCR nonreactive 12/11-blood culture with Staph hominis and epidermidis in 1 out of 2 bottles. 12/13-repeat blood cultures NGTD.  Objective: Vitals:   06/30/24 0527 06/30/24 2053 07/01/24 0459 07/01/24 1235  BP: (!) 106/58 134/62 (!) 125/57 (!) 138/90  Pulse: 63 67 87 70  Resp: 19 18  18   Temp: (!) 97.5 F (36.4 C) 99.1 F (37.3 C) 98.3 F (36.8 C) 98.2 F (36.8 C)  TempSrc: Oral Oral Oral   SpO2: 98% 98% 99% 96%    Examination:  GENERAL: No apparent distress.  Appears frail and weak. HEENT: MMM.  Vision and hearing grossly intact.  NECK:  Supple.  No apparent JVD.  RESP:  No IWOB.  Fair aeration bilaterally. CVS:  RRR.  2/6 SEM over RUSB and LUSB. ABD/GI/GU: BS+. Abd soft, NTND.  MSK/EXT:  Moves extremities.  Significant muscle mass and subcu fat loss. SKIN:  Dressing over right hip DTI. NEURO: Awake.  Not able to answer orientation questions.  No apparent focal neuro deficit. PSYCH: Calm. Normal affect.   Sch Meds:  Scheduled Meds:  acetaminophen   1,000 mg Oral Q8H   aspirin   81 mg Oral BID   busPIRone   10 mg Oral BID   docusate sodium   100 mg Oral BID   PARoxetine   40 mg Oral QHS   temazepam   30 mg Oral QHS   Continuous Infusions: PRN Meds:.acetaminophen  **OR** acetaminophen , HYDROmorphone  (DILAUDID ) injection, menthol  **OR** phenol, methocarbamol  **OR** [DISCONTINUED] methocarbamol  (ROBAXIN ) injection, morphine , ondansetron  **OR** ondansetron  (ZOFRAN ) IV  Antimicrobials: Anti-infectives (From admission, onward)    Start     Dose/Rate Route Frequency Ordered Stop   06/22/24 1000  sulfamethoxazole -trimethoprim  (BACTRIM  DS) 800-160 MG per tablet 1 tablet  Status:  Discontinued        1 tablet Oral Every 12 hours 06/22/24 0859 06/24/24 1147   06/19/24 2200  sulfamethoxazole -trimethoprim  (BACTRIM  DS) 800-160 MG per tablet 1 tablet  Status:  Discontinued        1 tablet Oral Every 12 hours 06/19/24 1706 06/22/24 0859   06/19/24 0000  sulfamethoxazole -trimethoprim  (BACTRIM  DS) 800-160 MG tablet  Status:  Discontinued        1 tablet Oral Every 12 hours 06/19/24 1707 06/24/24    06/16/24 2245  ceFAZolin  (ANCEF ) IVPB 2g/100 mL premix        2 g 200 mL/hr over 30 Minutes Intravenous Every 6 hours 06/16/24 2152 06/17/24 1406   06/16/24 1245  ceFAZolin  (ANCEF ) IVPB 2g/100 mL premix        2 g 200 mL/hr over 30 Minutes Intravenous On call to O.R. 06/16/24 1230 06/16/24 1450   06/16/24 1231  ceFAZolin  (ANCEF ) 2-4 GM/100ML-% IVPB       Note to Pharmacy: [name] D: cabinet override      06/16/24 1231 06/16/24 1458   06/16/24 1000  cefTRIAXone  (ROCEPHIN ) 1 g in sodium chloride  0.9 % 100 mL IVPB        1 g 200 mL/hr over 30 Minutes Intravenous Every 24 hours 06/16/24 0938 06/18/24 1238        I have personally reviewed the following  labs and images: CBC: Recent Labs  Lab 06/29/24 0404 07/01/24 0431  WBC 7.7 9.1  HGB 8.4* 8.4*  HCT 25.9* 26.3*  MCV 97.7 97.4  PLT 589* 554*   BMP &GFR Recent Labs  Lab 06/29/24 0404  NA 140  K 4.3  CL 107  CO2 26  GLUCOSE 93  BUN 13  CREATININE 0.52  CALCIUM 8.4*  MG 2.3   CrCl cannot be calculated (Unknown ideal weight.). Liver & Pancreas: Recent Labs  Lab 06/29/24 0404  AST 29  ALT 12  ALKPHOS 162*  BILITOT 0.3  PROT 5.2*  ALBUMIN  2.7*   No results for input(s): LIPASE, AMYLASE in the last 168 hours. No results for input(s): AMMONIA in the last 168 hours. Diabetic: No results for input(s): HGBA1C in the last 72 hours. No results for input(s): GLUCAP in the last 168 hours. Cardiac Enzymes: No results for input(s): CKTOTAL, CKMB, CKMBINDEX, TROPONINI in the last 168 hours. No results for input(s): PROBNP in the last 8760 hours. Coagulation Profile: No  results for input(s): INR, PROTIME in the last 168 hours. Thyroid  Function Tests: No results for input(s): TSH, T4TOTAL, FREET4, T3FREE, THYROIDAB in the last 72 hours. Lipid Profile: No results for input(s): CHOL, HDL, LDLCALC, TRIG, CHOLHDL, LDLDIRECT in the last 72 hours. Anemia Panel: No results for input(s): VITAMINB12, FOLATE, FERRITIN, TIBC, IRON, RETICCTPCT in the last 72 hours. Urine analysis:    Component Value Date/Time   COLORURINE YELLOW 06/15/2024 2033   APPEARANCEUR HAZY (A) 06/15/2024 2033   LABSPEC 1.029 06/15/2024 2033   PHURINE 5.0 06/15/2024 2033   GLUCOSEU NEGATIVE 06/15/2024 2033   HGBUR NEGATIVE 06/15/2024 2033   BILIRUBINUR NEGATIVE 06/15/2024 2033   KETONESUR NEGATIVE 06/15/2024 2033   PROTEINUR 30 (A) 06/15/2024 2033   NITRITE NEGATIVE 06/15/2024 2033   LEUKOCYTESUR NEGATIVE 06/15/2024 2033   Sepsis Labs: Invalid input(s): PROCALCITONIN, LACTICIDVEN  Microbiology: No results found for this or any previous  visit (from the past 240 hours).  Radiology Studies: No results found.    Sehaj Kolden T. Mignon Bechler [facility]  If 7PM-7AM, please contact night-coverage [email or web address] 07/01/2024, 2:41 PM   "

## 2024-07-01 NOTE — Plan of Care (Signed)

## 2024-07-01 NOTE — Progress Notes (Signed)
 "                                                                                                                                                                                                          Daily Progress Note   Patient Name: Courtney Avila       Date: 07/01/2024 DOB: 1941-03-06  Age: 83 y.o. MRN#: [id number] Attending Physician: Kathrin Mignon DASEN, MD Primary Care Physician: [name], NP Admit Date: 06/15/2024  Reason for Consultation/Follow-up: Establishing goals of care  Subjective:  Appears frail and weak.  Mumbles incoherently. Contracted.   Length of Stay: 15  Current Medications: Scheduled Meds:   acetaminophen   1,000 mg Oral Q8H   aspirin   81 mg Oral BID   busPIRone   10 mg Oral BID   docusate sodium   100 mg Oral BID   PARoxetine   40 mg Oral QHS   temazepam   30 mg Oral QHS    Continuous Infusions:   PRN Meds: acetaminophen  **OR** acetaminophen , HYDROmorphone  (DILAUDID ) injection, menthol  **OR** phenol, methocarbamol  **OR** [DISCONTINUED] methocarbamol  (ROBAXIN ) injection, morphine , ondansetron  **OR** ondansetron  (ZOFRAN ) IV  Physical Exam         Appears frail and weak Has significant muscle loss Has dressing over R hip Awake but not alert   Vital Signs: BP (!) 125/57   Pulse 87   Temp 98.3 F (36.8 C) (Oral)   Resp 18   SpO2 99%  SpO2: SpO2: 99 % O2 Device: O2 Device: Room Air O2 Flow Rate: O2 Flow Rate (L/min): 3 L/min  Intake/output summary:  Intake/Output Summary (Last 24 hours) at 07/01/2024 1134 Last data filed at 07/01/2024 0300 Gross per 24 hour  Intake 600 ml  Output 400 ml  Net 200 ml   LBM: Last BM Date : 06/30/24 Baseline Weight:   Most recent weight:         Palliative Assessment/Data:      Patient Active Problem List   Diagnosis Date Noted   Dementia (HCC) 06/16/2024   GERD (gastroesophageal reflux disease) 06/16/2024   Closed 2-part intertrochanteric fracture of proximal end of femur with delayed healing,  right 06/16/2024   Closed compression fracture of body of L1 vertebra (HCC) 06/16/2024   Subdural hematoma (HCC) 06/16/2024   Displaced fracture of base of neck of right femur, initial encounter for closed fracture (HCC) 06/16/2024   OA (osteoarthritis) of knee 12/07/2016   S/P reverse total shoulder arthroplasty, 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  Patient with escalating symptom burden, worsening cognitive and functional status in this hospital stay thus far, insurance denied SNF. Palliative care consult for ongoing goals of care discussions has been requested  Recommendations/Plan: Continue current pain medications and conservative management.  Call placed and was able to reach son [name] - he wants to wait for results of SNF appeal, consider SNF rehab with palliative is appeal successful, however, have introduced hospice approach with son on phone today. TRH MD updated, PMT to continue to monitor hospital course and will follow up on 07-02-24.   Code Status:    Code Status Orders  (From admission, onward)           Start     Ordered   06/16/24 0049  Do not attempt resuscitation (DNR)- Limited -Do Not Intubate (DNI)  Continuous       Question Answer Comment  If pulseless and not breathing No CPR or chest compressions.   In Pre-Arrest Conditions (Patient Is Breathing and Has A Pulse) Do not intubate. Provide all appropriate non-invasive medical interventions. Avoid ICU transfer unless indicated or required.   Consent: Discussion documented in EHR or advanced directives reviewed      06/16/24 0049           Code Status History     Date Active Date Inactive Code Status Order ID Comments User  Context   01/24/2024 1529 06/15/2024 1835 DNR [id number]  [name], NP Outpatient   12/07/2016 1636 12/09/2016 1730 Full Code [id number]  Melodi Lerner, MD Inpatient   04/09/2016 1215 04/10/2016 2031 Full Code [id number]  Garland Sora, PA-C Inpatient       Prognosis:  < 2 weeks  Discharge Planning: To Be Determined  Discussed on the phone with son [name].   Care plan was discussed with  IDT Dr Kathrin.   Thank you for allowing the Palliative Medicine Team to assist in the care of this patient. high MDM.      Greater than 50%  of this time was spent counseling and coordinating care related to the above assessment and plan.  Lonia Serve, MD  Please contact Palliative Medicine Team phone at [phone] for questions and concerns.       "

## 2024-07-02 DIAGNOSIS — F02C0 Dementia in other diseases classified elsewhere, severe, without behavioral disturbance, psychotic disturbance, mood disturbance, and anxiety: Secondary | ICD-10-CM | POA: Diagnosis not present

## 2024-07-02 DIAGNOSIS — S065XAA Traumatic subdural hemorrhage with loss of consciousness status unknown, initial encounter: Secondary | ICD-10-CM | POA: Diagnosis not present

## 2024-07-02 DIAGNOSIS — S72141G Displaced intertrochanteric fracture of right femur, subsequent encounter for closed fracture with delayed healing: Secondary | ICD-10-CM | POA: Diagnosis not present

## 2024-07-02 DIAGNOSIS — S32010A Wedge compression fracture of first lumbar vertebra, initial encounter for closed fracture: Secondary | ICD-10-CM | POA: Diagnosis not present

## 2024-07-02 DIAGNOSIS — K219 Gastro-esophageal reflux disease without esophagitis: Secondary | ICD-10-CM | POA: Diagnosis not present

## 2024-07-02 DIAGNOSIS — S72041A Displaced fracture of base of neck of right femur, initial encounter for closed fracture: Secondary | ICD-10-CM | POA: Diagnosis not present

## 2024-07-02 DIAGNOSIS — G309 Alzheimer's disease, unspecified: Secondary | ICD-10-CM | POA: Diagnosis not present

## 2024-07-02 NOTE — Progress Notes (Signed)
 " PROGRESS NOTE  Courtney Avila FMW:[id number] DOB: [birth year]   PCP: [name], NP  Patient is from: Home.  DOA: 06/15/2024 LOS: 16  Chief complaints Chief Complaint  Patient presents with   uti     Brief Narrative / Interim history: 83 year old with hypertension, dementia presented with unwitnessed fall, found to have subdural hematoma and right hip fracture.  Underwent right hip arthroplasty 12/12.  Neurosurgery recommended conservative management for subdural. Not progressing well.  Insurance denied SNF.  Family appealed.  She has failure to thrive.  Palliative medicine following.  Subjective: Seen and examined earlier this morning.  No major events overnight or this morning.  No complaints but not a great historian.  She is more alert today.  Oriented to self.   Assessment and plan: Closed right hip fracture: Traumatic due to fall. -S/p Total hip arthroplasty 12/12, Dr. Vernetta -Aspirin  81 mg twice daily for DVT prophylaxis -Multimodal pain control with scheduled Tylenol  and as needed p.o. morphine  and IV Dilaudid . -Weightbearing as tolerated -Therapy recommended SNF.   Acute blood loss anemia due to surgical blood loss: Received blood transfusions.  Relatively stable. Recent Labs    07/12/23 1605 01/24/24 1540 06/15/24 1942 06/16/24 0918 06/17/24 1318 06/17/24 1539 06/18/24 0236 06/19/24 1516 06/29/24 0404 07/01/24 0431  HGB 11.9 10.8* 10.5* 9.1* 6.1* 5.9* 9.2* 9.4* 8.4* 8.4*  - Continue monitoring  Subdural hematoma: Repeat CT on 12/12 with stable mixed density left SDH - NSGY recommended conservative management.  Positive blood culture: Blood culture on 12/1 with Staph hominis and epidermidis in 1 out of 2 bottles likely contaminant.  Repeat blood culture on 12/13 negative.  Severe dementia with behavioral disturbances: Oriented to self for most part. - Reorientation and delirium precaution  - Restoril , BuSpar  and Paxil . - Palliative consulted  for goal of care.  Failure to thrive: Gradually declining. There is no height or weight on file to calculate BMI. - Palliative consulted for goal of care        Pressure skin injury Wound 06/18/24 1820 Pressure Injury Buttocks Right;Medial;Mid Deep Tissue Pressure Injury - Purple or maroon localized area of discolored intact skin or blood-filled blister due to damage of underlying soft tissue from pressure and/or shear. (Active)     Wound 06/18/24 1857 Pressure Injury Heel Right Stage 1 -  Intact skin with non-blanchable redness of a localized area usually over a bony prominence. (Active)   DVT prophylaxis:  SCDs Start: 06/16/24 2152 SCDs Start: 06/16/24 0734  Code Status: DNR Family Communication: None at bedside Level of care: Med-Surg Status is: Inpatient Remains inpatient appropriate because: Lack of safe disposition   Final disposition: SNF?   25 minutes with more than 50% spent in reviewing records, counseling patient/family and coordinating care.  Consultants:  Orthopedic surgery Palliative medicine  Procedures: 12/12-right THA  Microbiology summarized: 12/11-COVID-19, influenza and RSV PCR nonreactive 12/11-blood culture with Staph hominis and epidermidis in 1 out of 2 bottles. 12/13-repeat blood cultures NGTD.  Objective: Vitals:   07/01/24 0459 07/01/24 1235 07/01/24 1913 07/02/24 1412  BP: (!) 125/57 (!) 138/90 114/67 113/82  Pulse: 87 70 94 74  Resp:  18 20 18   Temp: 98.3 F (36.8 C) 98.2 F (36.8 C) 98.3 F (36.8 C) 98.1 F (36.7 C)  TempSrc: Oral   Oral  SpO2: 99% 96%  99%    Examination:  GENERAL: No apparent distress.  Appears frail and weak. HEENT: MMM.  Vision and hearing grossly intact.  NECK: Supple.  No apparent JVD.  RESP:  No IWOB.  Fair aeration bilaterally. CVS:  RRR.  2/6 SEM over RUSB and LUSB. ABD/GI/GU: BS+. Abd soft, NTND.  MSK/EXT:  Moves extremities.  Significant muscle mass and subcu fat loss. SKIN: Dressing over right  hip DTI. NEURO: Awake.  Not able to answer orientation questions.  No apparent focal neuro deficit. PSYCH: Calm. Normal affect.   Sch Meds:  Scheduled Meds:  acetaminophen   1,000 mg Oral Q8H   aspirin   81 mg Oral BID   busPIRone   10 mg Oral BID   docusate sodium   100 mg Oral BID   PARoxetine   40 mg Oral QHS   temazepam   30 mg Oral QHS   Continuous Infusions: PRN Meds:.acetaminophen  **OR** acetaminophen , HYDROmorphone  (DILAUDID ) injection, menthol  **OR** phenol, methocarbamol  **OR** [DISCONTINUED] methocarbamol  (ROBAXIN ) injection, morphine , ondansetron  **OR** ondansetron  (ZOFRAN ) IV  Antimicrobials: Anti-infectives (From admission, onward)    Start     Dose/Rate Route Frequency Ordered Stop   06/22/24 1000  sulfamethoxazole -trimethoprim  (BACTRIM  DS) 800-160 MG per tablet 1 tablet  Status:  Discontinued        1 tablet Oral Every 12 hours 06/22/24 0859 06/24/24 1147   06/19/24 2200  sulfamethoxazole -trimethoprim  (BACTRIM  DS) 800-160 MG per tablet 1 tablet  Status:  Discontinued        1 tablet Oral Every 12 hours 06/19/24 1706 06/22/24 0859   06/19/24 0000  sulfamethoxazole -trimethoprim  (BACTRIM  DS) 800-160 MG tablet  Status:  Discontinued        1 tablet Oral Every 12 hours 06/19/24 1707 06/24/24    06/16/24 2245  ceFAZolin  (ANCEF ) IVPB 2g/100 mL premix        2 g 200 mL/hr over 30 Minutes Intravenous Every 6 hours 06/16/24 2152 06/17/24 1406   06/16/24 1245  ceFAZolin  (ANCEF ) IVPB 2g/100 mL premix        2 g 200 mL/hr over 30 Minutes Intravenous On call to O.R. 06/16/24 1230 06/16/24 1450   06/16/24 1231  ceFAZolin  (ANCEF ) 2-4 GM/100ML-% IVPB       Note to Pharmacy: [name] D: cabinet override      06/16/24 1231 06/16/24 1458   06/16/24 1000  cefTRIAXone  (ROCEPHIN ) 1 g in sodium chloride  0.9 % 100 mL IVPB        1 g 200 mL/hr over 30 Minutes Intravenous Every 24 hours 06/16/24 0938 06/18/24 1238        I have personally reviewed the following labs and  images: CBC: Recent Labs  Lab 06/29/24 0404 07/01/24 0431  WBC 7.7 9.1  HGB 8.4* 8.4*  HCT 25.9* 26.3*  MCV 97.7 97.4  PLT 589* 554*   BMP &GFR Recent Labs  Lab 06/29/24 0404  NA 140  K 4.3  CL 107  CO2 26  GLUCOSE 93  BUN 13  CREATININE 0.52  CALCIUM 8.4*  MG 2.3   CrCl cannot be calculated (Unknown ideal weight.). Liver & Pancreas: Recent Labs  Lab 06/29/24 0404  AST 29  ALT 12  ALKPHOS 162*  BILITOT 0.3  PROT 5.2*  ALBUMIN  2.7*   No results for input(s): LIPASE, AMYLASE in the last 168 hours. No results for input(s): AMMONIA in the last 168 hours. Diabetic: No results for input(s): HGBA1C in the last 72 hours. No results for input(s): GLUCAP in the last 168 hours. Cardiac Enzymes: No results for input(s): CKTOTAL, CKMB, CKMBINDEX, TROPONINI in the last 168 hours. No results for input(s): PROBNP in the last 8760 hours. Coagulation Profile: No results for  input(s): INR, PROTIME in the last 168 hours. Thyroid  Function Tests: No results for input(s): TSH, T4TOTAL, FREET4, T3FREE, THYROIDAB in the last 72 hours. Lipid Profile: No results for input(s): CHOL, HDL, LDLCALC, TRIG, CHOLHDL, LDLDIRECT in the last 72 hours. Anemia Panel: No results for input(s): VITAMINB12, FOLATE, FERRITIN, TIBC, IRON, RETICCTPCT in the last 72 hours. Urine analysis:    Component Value Date/Time   COLORURINE YELLOW 06/15/2024 2033   APPEARANCEUR HAZY (A) 06/15/2024 2033   LABSPEC 1.029 06/15/2024 2033   PHURINE 5.0 06/15/2024 2033   GLUCOSEU NEGATIVE 06/15/2024 2033   HGBUR NEGATIVE 06/15/2024 2033   BILIRUBINUR NEGATIVE 06/15/2024 2033   KETONESUR NEGATIVE 06/15/2024 2033   PROTEINUR 30 (A) 06/15/2024 2033   NITRITE NEGATIVE 06/15/2024 2033   LEUKOCYTESUR NEGATIVE 06/15/2024 2033   Sepsis Labs: Invalid input(s): PROCALCITONIN, LACTICIDVEN  Microbiology: No results found for this or any previous visit (from  the past 240 hours).  Radiology Studies: No results found.    Quinlan Vollmer T. Sankalp Ferrell [facility]  If 7PM-7AM, please contact night-coverage [email or web address] 07/02/2024, 4:24 PM   "

## 2024-07-02 NOTE — Plan of Care (Signed)
   Problem: Clinical Measurements: Goal: Ability to maintain clinical measurements within normal limits will improve Outcome: Progressing Goal: Will remain free from infection Outcome: Progressing   Problem: Nutrition: Goal: Adequate nutrition will be maintained Outcome: Progressing

## 2024-07-02 NOTE — Plan of Care (Signed)
" °  Problem: Education: Goal: Knowledge of General Education information will improve Description: Including pain rating scale, medication(s)/side effects and non-pharmacologic comfort measures Outcome: Progressing   Problem: Clinical Measurements: Goal: Ability to maintain clinical measurements within normal limits will improve Outcome: Progressing Goal: Will remain free from infection Outcome: Progressing Goal: Diagnostic test results will improve Outcome: Progressing Goal: Respiratory complications will improve Outcome: Progressing Goal: Cardiovascular complication will be avoided Outcome: Progressing   Problem: Activity: Goal: Risk for activity intolerance will decrease Outcome: Progressing   Problem: Nutrition: Goal: Adequate nutrition will be maintained Outcome: Progressing   Problem: Coping: Goal: Level of anxiety will decrease Outcome: Progressing   Problem: Elimination: Goal: Will not experience complications related to bowel motility Outcome: Progressing Goal: Will not experience complications related to urinary retention Outcome: Progressing   Problem: Pain Managment: Goal: General experience of comfort will improve and/or be controlled Outcome: Progressing   Problem: Safety: Goal: Ability to remain free from injury will improve Outcome: Progressing   Problem: Skin Integrity: Goal: Risk for impaired skin integrity will decrease Outcome: Progressing   Problem: Activity: Goal: Ability to tolerate increased activity will improve Outcome: Progressing   Problem: Pain Management: Goal: Pain level will decrease with appropriate interventions Outcome: Progressing   Problem: Skin Integrity: Goal: Will show signs of wound healing Outcome: Progressing   "

## 2024-07-02 NOTE — Progress Notes (Signed)
 "                                                                                                                                                                                                          Daily Progress Note   Patient Name: Courtney Avila       Date: 07/02/2024 DOB: August 13, 1940  Age: 83 y.o. MRN#: [id number] Attending Physician: Kathrin Mignon DASEN, MD Primary Care Physician: [name], NP Admit Date: 06/15/2024  Reason for Consultation/Follow-up: Establishing goals of care  Subjective:  Appears frail and weak.  Awake alert this am, responds appropriately to some questions, knows her name but doesn't know that her hip was operated on.   Length of Stay: 16  Current Medications: Scheduled Meds:   acetaminophen   1,000 mg Oral Q8H   aspirin   81 mg Oral BID   busPIRone   10 mg Oral BID   docusate sodium   100 mg Oral BID   PARoxetine   40 mg Oral QHS   temazepam   30 mg Oral QHS    Continuous Infusions:   PRN Meds: acetaminophen  **OR** acetaminophen , HYDROmorphone  (DILAUDID ) injection, menthol  **OR** phenol, methocarbamol  **OR** [DISCONTINUED] methocarbamol  (ROBAXIN ) injection, morphine , ondansetron  **OR** ondansetron  (ZOFRAN ) IV  Physical Exam         Appears frail and weak Has significant muscle loss Has dressing over R hip Awake   alert   Vital Signs: BP 114/67   Pulse 94   Temp 98.3 F (36.8 C)   Resp 20   SpO2 96%  SpO2: SpO2: 96 % O2 Device: O2 Device: Room Air O2 Flow Rate: O2 Flow Rate (L/min): 3 L/min  Intake/output summary:  Intake/Output Summary (Last 24 hours) at 07/02/2024 1029 Last data filed at 07/01/2024 1900 Gross per 24 hour  Intake 660 ml  Output --  Net 660 ml   LBM: Last BM Date : 07/01/24 Baseline Weight:   Most recent weight:         Palliative Assessment/Data:      Patient Active Problem List   Diagnosis Date Noted   Dementia (HCC) 06/16/2024   GERD (gastroesophageal reflux disease) 06/16/2024   Closed 2-part  intertrochanteric fracture of proximal end of femur with delayed healing, right 06/16/2024   Closed compression fracture of body of L1 vertebra (HCC) 06/16/2024   Subdural hematoma (HCC) 06/16/2024   Displaced fracture of base of neck of right femur, initial encounter for closed fracture (HCC) 06/16/2024   OA (osteoarthritis) of knee 12/07/2016   S/P reverse total shoulder arthroplasty, 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  Patient with escalating symptom burden, worsening cognitive and functional status in this hospital stay thus far, insurance denied SNF. Palliative care consult for ongoing goals of care discussions has been requested  Recommendations/Plan: Continue current pain medications and conservative management.  07-01-24:Call placed and was able to reach son [name] - he wants to wait for results of SNF appeal, consider SNF rehab with palliative is appeal successful, however, have introduced hospice approach with son on phone today. TRH MD updated  SNF rehab with palliative is being explored.   Code Status:    Code Status Orders  (From admission, onward)           Start     Ordered   06/16/24 0049  Do not attempt resuscitation (DNR)- Limited -Do Not Intubate (DNI)  Continuous       Question Answer Comment  If pulseless and not breathing No CPR or chest compressions.   In Pre-Arrest Conditions (Patient Is Breathing and Has A Pulse) Do not intubate. Provide all appropriate non-invasive medical interventions. Avoid ICU transfer unless indicated or required.   Consent: Discussion documented in EHR or advanced directives reviewed      06/16/24 0049           Code Status History     Date  Active Date Inactive Code Status Order ID Comments User Context   01/24/2024 1529 06/15/2024 1835 DNR [id number]  [name], NP Outpatient   12/07/2016 1636 12/09/2016 1730 Full Code [id number]  Melodi Lerner, MD Inpatient   04/09/2016 1215 04/10/2016 2031 Full Code [id number]  Garland Randine RIGGERS Inpatient       Prognosis:  Unable to determine  Discharge Planning: Skilled Nursing Facility for rehab with Palliative care service follow-up     Care plan was discussed with  IDT    Thank you for allowing the Palliative Medicine Team to assist in the care of this patient.  Mod MDM.      Greater than 50%  of this time was spent counseling and coordinating care related to the above assessment and plan.  Lonia Serve, MD  Please contact Palliative Medicine Team phone at [phone] for questions and concerns.       "

## 2024-07-03 DIAGNOSIS — K219 Gastro-esophageal reflux disease without esophagitis: Secondary | ICD-10-CM | POA: Diagnosis not present

## 2024-07-03 DIAGNOSIS — G309 Alzheimer's disease, unspecified: Secondary | ICD-10-CM | POA: Diagnosis not present

## 2024-07-03 DIAGNOSIS — S065XAA Traumatic subdural hemorrhage with loss of consciousness status unknown, initial encounter: Secondary | ICD-10-CM | POA: Diagnosis not present

## 2024-07-03 DIAGNOSIS — S72041A Displaced fracture of base of neck of right femur, initial encounter for closed fracture: Secondary | ICD-10-CM | POA: Diagnosis not present

## 2024-07-03 MED ORDER — OXYCODONE HCL 5 MG PO TABS: 2.5000 mg | ORAL_TABLET | ORAL | Status: DC | PRN

## 2024-07-03 MED ORDER — HYDROMORPHONE HCL 1 MG/ML IJ SOLN
0.5000 mg | INTRAMUSCULAR | Status: DC | PRN
  Administered 2024-07-04 – 2024-07-15 (×28): 0.5 mg via INTRAVENOUS
  Filled 2024-07-03 (×30): qty 0.5

## 2024-07-03 MED ORDER — OXYCODONE HCL 5 MG PO TABS
5.0000 mg | ORAL_TABLET | ORAL | Status: DC | PRN
  Administered 2024-07-03 – 2024-07-15 (×19): 5 mg via ORAL
  Filled 2024-07-03 (×19): qty 1

## 2024-07-03 NOTE — Plan of Care (Signed)
" °  Problem: Education: Goal: Knowledge of General Education information will improve Description: Including pain rating scale, medication(s)/side effects and non-pharmacologic comfort measures Outcome: Progressing   Problem: Clinical Measurements: Goal: Will remain free from infection Outcome: Progressing Goal: Diagnostic test results will improve Outcome: Progressing Goal: Respiratory complications will improve Outcome: Progressing Goal: Cardiovascular complication will be avoided Outcome: Progressing   Problem: Activity: Goal: Risk for activity intolerance will decrease Outcome: Progressing   Problem: Nutrition: Goal: Adequate nutrition will be maintained Outcome: Progressing   Problem: Coping: Goal: Level of anxiety will decrease Outcome: Progressing   Problem: Elimination: Goal: Will not experience complications related to bowel motility Outcome: Progressing Goal: Will not experience complications related to urinary retention Outcome: Progressing   Problem: Pain Managment: Goal: General experience of comfort will improve and/or be controlled Outcome: Progressing   Problem: Safety: Goal: Ability to remain free from injury will improve Outcome: Progressing   Problem: Skin Integrity: Goal: Risk for impaired skin integrity will decrease Outcome: Progressing   Problem: Pain Management: Goal: Pain level will decrease with appropriate interventions Outcome: Progressing   Problem: Skin Integrity: Goal: Will show signs of wound healing Outcome: Progressing   "

## 2024-07-03 NOTE — Progress Notes (Signed)
 "                                                                                                                                                                                                          Daily Progress Note   Patient Name: Courtney Avila       Date: 07/03/2024 DOB: 1940/11/19  Age: 83 y.o. MRN#: [id number] Attending Physician: Kathrin Mignon DASEN, MD Primary Care Physician: [name], NP Admit Date: 06/15/2024  Reason for Consultation/Follow-up: Establishing goals of care  Subjective:  Appears frail and weak.  Awakens easily and is reasonably alert this am, chart reviewed, some minimal to moderate PO intake  Length of Stay: 17  Current Medications: Scheduled Meds:   acetaminophen   1,000 mg Oral Q8H   aspirin   81 mg Oral BID   busPIRone   10 mg Oral BID   docusate sodium   100 mg Oral BID   PARoxetine   40 mg Oral QHS   temazepam   30 mg Oral QHS    Continuous Infusions:   PRN Meds: acetaminophen  **OR** acetaminophen , HYDROmorphone  (DILAUDID ) injection, menthol  **OR** phenol, methocarbamol  **OR** [DISCONTINUED] methocarbamol  (ROBAXIN ) injection, morphine , ondansetron  **OR** ondansetron  (ZOFRAN ) IV  Physical Exam         Appears frail and weak Has significant muscle loss Has dressing over R hip Awake   alert   Vital Signs: BP 126/73 (BP Location: Right Arm)   Pulse 100   Temp 99.2 F (37.3 C) (Oral)   Resp 16   SpO2 96%  SpO2: SpO2: 96 % O2 Device: O2 Device: Room Air O2 Flow Rate: O2 Flow Rate (L/min): 3 L/min  Intake/output summary:  Intake/Output Summary (Last 24 hours) at 07/03/2024 9071 Last data filed at 07/02/2024 1700 Gross per 24 hour  Intake 860 ml  Output --  Net 860 ml   LBM: Last BM Date : 07/03/24 Baseline Weight:   Most recent weight:         Palliative Assessment/Data:      Patient Active Problem List   Diagnosis Date Noted   Dementia (HCC) 06/16/2024   GERD (gastroesophageal reflux disease) 06/16/2024   Closed 2-part  intertrochanteric fracture of proximal end of femur with delayed healing, right 06/16/2024   Closed compression fracture of body of L1 vertebra (HCC) 06/16/2024   Subdural hematoma (HCC) 06/16/2024   Displaced fracture of base of neck of right femur, initial encounter for closed fracture (HCC) 06/16/2024   OA (osteoarthritis) of knee 12/07/2016   S/P reverse total shoulder arthroplasty, 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  Patient with escalating symptom burden, worsening cognitive and functional status in this hospital stay thus far, insurance denied SNF. Palliative care consult for ongoing goals of care discussions has been requested  Recommendations/Plan: Continue current pain medications and conservative management.  07-01-24:Call placed and was able to reach son [name] - he wants to wait for results of SNF appeal, consider SNF rehab with palliative is appeal successful, however, have introduced hospice approach with son on phone today. TRH MD updated  SNF rehab with palliative is being explored. Son appealing SNF denial. PMT to follow peripherally and monitor hospital course.   Code Status:    Code Status Orders  (From admission, onward)           Start     Ordered   06/16/24 0049  Do not attempt resuscitation (DNR)- Limited -Do Not Intubate (DNI)  Continuous       Question Answer Comment  If pulseless and not breathing No CPR or chest compressions.   In Pre-Arrest Conditions (Patient Is Breathing and Has A Pulse) Do not intubate. Provide all appropriate non-invasive medical interventions. Avoid ICU transfer unless indicated or required.   Consent: Discussion documented in EHR or advanced  directives reviewed      06/16/24 0049           Code Status History     Date Active Date Inactive Code Status Order ID Comments User Context   01/24/2024 1529 06/15/2024 1835 DNR [id number]  [name], NP Outpatient   12/07/2016 1636 12/09/2016 1730 Full Code [id number]  Melodi Lerner, MD Inpatient   04/09/2016 1215 04/10/2016 2031 Full Code [id number]  Garland Randine RIGGERS Inpatient       Prognosis:  Unable to determine  Discharge Planning: Skilled Nursing Facility for rehab with Palliative care service follow-up     Care plan was discussed with  IDT    Thank you for allowing the Palliative Medicine Team to assist in the care of this patient.  Mod MDM.      Greater than 50%  of this time was spent counseling and coordinating care related to the above assessment and plan.  Lonia Serve, MD  Please contact Palliative Medicine Team phone at [phone] for questions and concerns.       "

## 2024-07-03 NOTE — Plan of Care (Signed)

## 2024-07-03 NOTE — Progress Notes (Signed)
 " PROGRESS NOTE  Courtney Avila FMW:[id number] DOB: 03-11-1941   PCP: [name], NP  Patient is from: Home.  DOA: 06/15/2024 LOS: 17  Chief complaints Chief Complaint  Patient presents with   uti     Brief Narrative / Interim history: 83 year old with hypertension, dementia presented with unwitnessed fall, found to have subdural hematoma and right hip fracture.  Underwent right hip arthroplasty 12/12.  Neurosurgery recommended conservative management for subdural. Not progressing well.  Insurance denied SNF.  Family appealed.  She has failure to thrive.  Palliative medicine following.  Subjective: Seen and examined earlier this morning.  No major events overnight or this morning.  Sleepy.   Assessment and plan: Closed right hip fracture: Traumatic due to fall. -S/p Total hip arthroplasty 12/12, Dr. Vernetta -Aspirin  81 mg twice daily for DVT prophylaxis -Multimodal pain control with scheduled Tylenol  and as needed p.o. morphine  and IV Dilaudid . -Weightbearing as tolerated -Therapy recommended SNF.   Acute blood loss anemia due to surgical blood loss: Received blood transfusions.  Relatively stable. Recent Labs    07/12/23 1605 01/24/24 1540 06/15/24 1942 06/16/24 0918 06/17/24 1318 06/17/24 1539 06/18/24 0236 06/19/24 1516 06/29/24 0404 07/01/24 0431  HGB 11.9 10.8* 10.5* 9.1* 6.1* 5.9* 9.2* 9.4* 8.4* 8.4*  - Continue monitoring  Subdural hematoma: Repeat CT on 12/12 with stable mixed density left SDH - NSGY recommended conservative management.  Positive blood culture: Blood culture on 12/1 with Staph hominis and epidermidis in 1 out of 2 bottles likely contaminant.  Repeat blood culture on 12/13 negative.  Severe dementia with behavioral disturbances: Oriented to self for most part. - Reorientation and delirium precaution  - Restoril , BuSpar  and Paxil . - Palliative consulted for goal of care.  Failure to thrive: Gradually declining. There is no height  or weight on file to calculate BMI. - Palliative consulted for goal of care        Pressure skin injury Wound 06/18/24 1820 Pressure Injury Buttocks Right;Medial;Mid Deep Tissue Pressure Injury - Purple or maroon localized area of discolored intact skin or blood-filled blister due to damage of underlying soft tissue from pressure and/or shear. (Active)     Wound 06/18/24 1857 Pressure Injury Heel Right Stage 1 -  Intact skin with non-blanchable redness of a localized area usually over a bony prominence. (Active)   DVT prophylaxis:  SCDs Start: 06/16/24 2152 SCDs Start: 06/16/24 0734  Code Status: DNR Family Communication: None at bedside Level of care: Med-Surg Status is: Inpatient Remains inpatient appropriate because: Lack of safe disposition   Final disposition: SNF?   25 minutes with more than 50% spent in reviewing records, counseling patient/family and coordinating care.  Consultants:  Orthopedic surgery Palliative medicine  Procedures: 12/12-right THA  Microbiology summarized: 12/11-COVID-19, influenza and RSV PCR nonreactive 12/11-blood culture with Staph hominis and epidermidis in 1 out of 2 bottles. 12/13-repeat blood cultures NGTD.  Objective: Vitals:   07/01/24 1913 07/02/24 1412 07/02/24 1943 07/03/24 0500  BP: 114/67 113/82 (!) 109/55 126/73  Pulse: 94 74 73 100  Resp: 20 18 18 16   Temp: 98.3 F (36.8 C) 98.1 F (36.7 C) 98.9 F (37.2 C) 99.2 F (37.3 C)  TempSrc:  Oral Oral Oral  SpO2:  99% 100% 96%    Examination:  GENERAL: No apparent distress.  Appears frail and weak. HEENT: MMM.  Vision and hearing grossly intact.  NECK: Supple.  No apparent JVD.  RESP:  No IWOB.  Fair aeration bilaterally. CVS:  RRR.  2/6  SEM over RUSB and LUSB. ABD/GI/GU: BS+. Abd soft, NTND.  MSK/EXT:  Moves extremities.  Significant muscle mass and subcu fat loss. SKIN: Dressing over right hip DTI. NEURO: Sleepy this morning.  No apparent focal neuro  deficit. PSYCH: Calm. Normal affect.   Sch Meds:  Scheduled Meds:  acetaminophen   1,000 mg Oral Q8H   aspirin   81 mg Oral BID   busPIRone   10 mg Oral BID   docusate sodium   100 mg Oral BID   PARoxetine   40 mg Oral QHS   temazepam   30 mg Oral QHS   Continuous Infusions: PRN Meds:.acetaminophen  **OR** acetaminophen , HYDROmorphone  (DILAUDID ) injection, menthol  **OR** phenol, methocarbamol  **OR** [DISCONTINUED] methocarbamol  (ROBAXIN ) injection, morphine , ondansetron  **OR** ondansetron  (ZOFRAN ) IV  Antimicrobials: Anti-infectives (From admission, onward)    Start     Dose/Rate Route Frequency Ordered Stop   06/22/24 1000  sulfamethoxazole -trimethoprim  (BACTRIM  DS) 800-160 MG per tablet 1 tablet  Status:  Discontinued        1 tablet Oral Every 12 hours 06/22/24 0859 06/24/24 1147   06/19/24 2200  sulfamethoxazole -trimethoprim  (BACTRIM  DS) 800-160 MG per tablet 1 tablet  Status:  Discontinued        1 tablet Oral Every 12 hours 06/19/24 1706 06/22/24 0859   06/19/24 0000  sulfamethoxazole -trimethoprim  (BACTRIM  DS) 800-160 MG tablet  Status:  Discontinued        1 tablet Oral Every 12 hours 06/19/24 1707 06/24/24    06/16/24 2245  ceFAZolin  (ANCEF ) IVPB 2g/100 mL premix        2 g 200 mL/hr over 30 Minutes Intravenous Every 6 hours 06/16/24 2152 06/17/24 1406   06/16/24 1245  ceFAZolin  (ANCEF ) IVPB 2g/100 mL premix        2 g 200 mL/hr over 30 Minutes Intravenous On call to O.R. 06/16/24 1230 06/16/24 1450   06/16/24 1231  ceFAZolin  (ANCEF ) 2-4 GM/100ML-% IVPB       Note to Pharmacy: [name] D: cabinet override      06/16/24 1231 06/16/24 1458   06/16/24 1000  cefTRIAXone  (ROCEPHIN ) 1 g in sodium chloride  0.9 % 100 mL IVPB        1 g 200 mL/hr over 30 Minutes Intravenous Every 24 hours 06/16/24 0938 06/18/24 1238        I have personally reviewed the following labs and images: CBC: Recent Labs  Lab 06/29/24 0404 07/01/24 0431  WBC 7.7 9.1  HGB 8.4* 8.4*  HCT  25.9* 26.3*  MCV 97.7 97.4  PLT 589* 554*   BMP &GFR Recent Labs  Lab 06/29/24 0404  NA 140  K 4.3  CL 107  CO2 26  GLUCOSE 93  BUN 13  CREATININE 0.52  CALCIUM 8.4*  MG 2.3   CrCl cannot be calculated (Unknown ideal weight.). Liver & Pancreas: Recent Labs  Lab 06/29/24 0404  AST 29  ALT 12  ALKPHOS 162*  BILITOT 0.3  PROT 5.2*  ALBUMIN  2.7*   No results for input(s): LIPASE, AMYLASE in the last 168 hours. No results for input(s): AMMONIA in the last 168 hours. Diabetic: No results for input(s): HGBA1C in the last 72 hours. No results for input(s): GLUCAP in the last 168 hours. Cardiac Enzymes: No results for input(s): CKTOTAL, CKMB, CKMBINDEX, TROPONINI in the last 168 hours. No results for input(s): PROBNP in the last 8760 hours. Coagulation Profile: No results for input(s): INR, PROTIME in the last 168 hours. Thyroid  Function Tests: No results for input(s): TSH, T4TOTAL, FREET4, T3FREE, THYROIDAB in the  last 72 hours. Lipid Profile: No results for input(s): CHOL, HDL, LDLCALC, TRIG, CHOLHDL, LDLDIRECT in the last 72 hours. Anemia Panel: No results for input(s): VITAMINB12, FOLATE, FERRITIN, TIBC, IRON, RETICCTPCT in the last 72 hours. Urine analysis:    Component Value Date/Time   COLORURINE YELLOW 06/15/2024 2033   APPEARANCEUR HAZY (A) 06/15/2024 2033   LABSPEC 1.029 06/15/2024 2033   PHURINE 5.0 06/15/2024 2033   GLUCOSEU NEGATIVE 06/15/2024 2033   HGBUR NEGATIVE 06/15/2024 2033   BILIRUBINUR NEGATIVE 06/15/2024 2033   KETONESUR NEGATIVE 06/15/2024 2033   PROTEINUR 30 (A) 06/15/2024 2033   NITRITE NEGATIVE 06/15/2024 2033   LEUKOCYTESUR NEGATIVE 06/15/2024 2033   Sepsis Labs: Invalid input(s): PROCALCITONIN, LACTICIDVEN  Microbiology: No results found for this or any previous visit (from the past 240 hours).  Radiology Studies: No results found.    Dayzha Pogosyan T. [facility]ist  If 7PM-7AM, please contact night-coverage [email or web address] 07/03/2024, 1:52 PM   "

## 2024-07-04 DIAGNOSIS — S72041A Displaced fracture of base of neck of right femur, initial encounter for closed fracture: Secondary | ICD-10-CM | POA: Diagnosis not present

## 2024-07-04 DIAGNOSIS — G309 Alzheimer's disease, unspecified: Secondary | ICD-10-CM | POA: Diagnosis not present

## 2024-07-04 DIAGNOSIS — S065XAA Traumatic subdural hemorrhage with loss of consciousness status unknown, initial encounter: Secondary | ICD-10-CM | POA: Diagnosis not present

## 2024-07-04 DIAGNOSIS — K219 Gastro-esophageal reflux disease without esophagitis: Secondary | ICD-10-CM | POA: Diagnosis not present

## 2024-07-04 MED ORDER — LORAZEPAM 2 MG/ML IJ SOLN
0.5000 mg | Freq: Once | INTRAMUSCULAR | Status: AC
  Administered 2024-07-04: 0.5 mg via INTRAVENOUS
  Filled 2024-07-04: qty 1

## 2024-07-04 NOTE — Progress Notes (Signed)
 Physical Therapy Treatment Patient Details Name: Courtney Avila MRN: [id number] DOB: 02-21-1941 Today's Date: 07/04/2024   History of Present Illness 83 y.o. female was brought by EMS from home with concern for possible UTI by family. Patient apparently has had generalized pain since she had a fall 2 days ago. She is also having pelvic pain that the family thought was possibly UTI. She is normally ambulatory and can communicate despite advanced dementia. She had a fall 2 prior to admission and since that she has been less ambulatory. on Evaluation in ED pt was determined to have a 7 mm subdural hematoma,  right intertrochanteric fracture,  and and L1 compression fracture all presumed to be from the fall. Orthopedics and neurosurgery were both consulted PMH: Alzheimer's dementia, osteoarthritis, GERD, anxiety disorder, hypertension   was brought by EMS from home with concern for possible UTI by family.   Orthopedics and neurosurgery were both consulted. pt is s/p R DA THA    PT Comments  Patient continues to  demonstrate significant LE contractures which prevent   any sitting or transfers. Patient anxious but can be  diverted and  converse a little, Assisted to drink water  and chocolate shake. Patient unable to hold cup. PT will consider  discharge  from services due to  inability to progress functional mobility.  Reassess  PT  next visit.   If plan is discharge home, recommend the following: Two people to help with bathing/dressing/bathroom;Help with stairs or ramp for entrance;Assist for transportation;Two people to help with walking and/or transfers   Can travel by private vehicle        Equipment Recommendations  Deitra lift    Recommendations for Other Services       Precautions / Restrictions Precautions Precautions: Fall Precaution/Restrictions Comments: L>R kegs flexed and dooes not tolerate any extension attempts, [employer] Restrictions RLE Weight Bearing Per Provider Order: Weight  bearing as tolerated     Mobility  Bed Mobility   Bed Mobility: Rolling Rolling: Total assist, +2 for physical assistance, +2 for safety/equipment         General bed mobility comments: assisted to roll in bed legs in flexed fetal position with inability to stretche legs, linens changed    Transfers                   General transfer comment: legs are too contracted    Ambulation/Gait                   Stairs             Wheelchair Mobility     Tilt Bed    Modified Rankin (Stroke Patients Only)       Balance       Sitting balance - Comments: unable                                    Communication Communication Communication: Impaired Factors Affecting Communication: Reduced clarity of speech  Cognition Arousal: Alert Behavior During Therapy: Anxious, Restless, Lability                           PT - Cognition Comments: easily distracted to stop stridor like breathing, at times states I live you. Following commands: Impaired Following commands impaired: Follows one step commands inconsistently    Cueing Cueing Techniques: Verbal cues, Tactile cues, Gestural  cues, Visual cues  Exercises      General Comments        Pertinent Vitals/Pain Pain Assessment Pain Assessment: PAINAD Breathing: occasional labored breathing, short period of hyperventilation Negative Vocalization: occasional moan/groan, low speech, negative/disapproving quality Facial Expression: sad, frightened, frown Body Language: tense, distressed pacing, fidgeting Consolability: distracted or reassured by voice/touch PAINAD Score: 5 Pain Location: dificulty to tell which leg    Home Living                          Prior Function            PT Goals (current goals can now be found in the care plan section) Progress towards PT goals: Not progressing toward goals - comment (legs are contracted and patient cannot tolerate  ROM)    Frequency    Min 2X/week      PT Plan      Co-evaluation              AM-PAC PT 6 Clicks Mobility   Outcome Measure  Help needed turning from your back to your side while in a flat bed without using bedrails?: Total Help needed moving from lying on your back to sitting on the side of a flat bed without using bedrails?: Total Help needed moving to and from a bed to a chair (including a wheelchair)?: Total Help needed standing up from a chair using your arms (e.g., wheelchair or bedside chair)?: Total Help needed to walk in hospital room?: Total Help needed climbing 3-5 steps with a railing? : Total 6 Click Score: 6    End of Session   Activity Tolerance: Patient limited by pain;Other (comment) Patient left: in bed;with bed alarm set;with call bell/phone within reach;with family/visitor present Nurse Communication: Mobility status;Need for lift equipment PT Visit Diagnosis: Other abnormalities of gait and mobility (R26.89);Pain;Difficulty in walking, not elsewhere classified (R26.2);Adult, failure to thrive (R62.7) Pain - Right/Left: Left Pain - part of body: Leg;Knee;Hip     Time: 1537-1600 PT Time Calculation (min) (ACUTE ONLY): 23 min  Charges:    $Therapeutic Activity: 23-37 mins PT General Charges $$ ACUTE PT VISIT: 1 Visit                     Courtney Avila PT Acute Rehabilitation Services Office [phone]    Courtney [name] 07/04/2024, 4:40 PM

## 2024-07-04 NOTE — TOC Progression Note (Signed)
 Transition of Care [facility]) - Progression Note    Patient Details  Name: Courtney Avila MRN: [id number] Date of Birth: 1941/03/17  Transition of Care [facility]) CM/SW Contact  Doneta Glenys DASEN, RN Phone Number: 07/04/2024, 8:46 AM  Clinical Narrative:    CM called [name] son for appeal update. [name] states the appeal is at the 2nd Level. CM called [facility] Tammy to confirm information.   Expected Discharge Plan: Skilled Nursing Facility Barriers to Discharge: Continued Medical Work up, SNF Pending bed offer, Insurance Authorization               Expected Discharge Plan and Services       Living arrangements for the past 2 months: Single Family Home                                       Social Drivers of Health (SDOH) Interventions SDOH Screenings   Food Insecurity: No Food Insecurity (06/17/2024)  Housing: Low Risk (06/27/2024)  Transportation Needs: No Transportation Needs (06/17/2024)  Utilities: Not At Risk (06/17/2024)  Depression (PHQ2-9): Low Risk (05/01/2024)  Social Connections: Socially Isolated (06/17/2024)  Tobacco Use: Low Risk (06/16/2024)    Readmission Risk Interventions     No data to display

## 2024-07-04 NOTE — Plan of Care (Signed)

## 2024-07-04 NOTE — Plan of Care (Signed)
  Problem: Education: Goal: Knowledge of General Education information will improve Description: Including pain rating scale, medication(s)/side effects and non-pharmacologic comfort measures Outcome: Not Progressing   Problem: Nutrition: Goal: Adequate nutrition will be maintained Outcome: Not Progressing   Problem: Coping: Goal: Level of anxiety will decrease Outcome: Not Progressing

## 2024-07-04 NOTE — Progress Notes (Signed)
 " PROGRESS NOTE  Courtney Avila FMW:[id number] DOB: 10/08/1940   PCP: Courtney Harlene POUR, NP  Patient is from: Home.  DOA: 06/15/2024 LOS: 18  Chief complaints Chief Complaint  Patient presents with   uti     Brief Narrative / Interim history: 83 year old with hypertension, dementia presented with unwitnessed fall, found to have subdural hematoma and right hip fracture.  Underwent right hip arthroplasty 12/12.  Neurosurgery recommended conservative management for subdural. Not progressing well.  Insurance denied SNF.  Family appealed which is still pending.  Palliative medicine following.  Subjective: Seen and examined earlier this morning.  No major events overnight or this morning.  Awake.  She is morning this morning.  She responds no to pain but crys my legs few minutes later.   Assessment and plan: Closed right hip fracture: Traumatic due to fall. -S/p Total hip arthroplasty 12/12, Dr. Vernetta -Aspirin  81 mg twice daily for DVT prophylaxis -Multimodal pain control with scheduled Tylenol  and as needed oxycodone  and IV Dilaudid . -Weightbearing as tolerated -Therapy recommended SNF.   Acute blood loss anemia due to surgical blood loss: Received blood transfusions.  Relatively stable. -Monitor intermittently.  Subdural hematoma: Repeat CT on 12/12 with stable mixed density left SDH - NSGY recommended conservative management.  Positive blood culture: Blood culture on 12/1 with Staph hominis and epidermidis in 1 out of 2 bottles likely contaminant.  Repeat blood culture on 12/13 negative.  Severe dementia with behavioral disturbances: Oriented to self for most part.  Appears anxious. - Reorientation and delirium precaution  - Restoril , BuSpar  and Paxil . -Will give 1 dose of Ativan . - Palliative consulted for goal of care.  Failure to thrive: Gradually declining. There is no height or weight on file to calculate BMI. - Palliative consulted for goal of care         Pressure skin injury Wound 06/18/24 1820 Pressure Injury Buttocks Right;Medial;Mid Deep Tissue Pressure Injury - Purple or maroon localized area of discolored intact skin or blood-filled blister due to damage of underlying soft tissue from pressure and/or shear. (Active)     Wound 06/18/24 1857 Pressure Injury Heel Right Stage 1 -  Intact skin with non-blanchable redness of a localized area usually over a bony prominence. (Active)   DVT prophylaxis:  SCDs Start: 06/16/24 2152 SCDs Start: 06/16/24 0734  Code Status: DNR Family Communication: None at bedside Level of care: Med-Surg Status is: Inpatient Remains inpatient appropriate because: Lack of safe disposition   Final disposition: SNF?   25 minutes with more than 50% spent in reviewing records, counseling patient/family and coordinating care.  Consultants:  Orthopedic surgery Palliative medicine  Procedures: 12/12-right THA  Microbiology summarized: 12/11-COVID-19, influenza and RSV PCR nonreactive 12/11-blood culture with Staph hominis and epidermidis in 1 out of 2 bottles. 12/13-repeat blood cultures NGTD.  Objective: Vitals:   07/03/24 1948 07/04/24 0449 07/04/24 0851 07/04/24 1405  BP: 101/85 113/78 (!) 113/48 (!) 127/100  Pulse: 91 73 74 74  Resp: 19 20  16   Temp: 98.5 F (36.9 C) 98 F (36.7 C)  98.8 F (37.1 C)  TempSrc: Axillary Axillary  Oral  SpO2: 95% 98%  98%    Examination:  GENERAL: No apparent distress.  Appears frail and weak. HEENT: MMM.  Vision and hearing grossly intact.  NECK: Supple.  No apparent JVD.  RESP:  No IWOB.  Fair aeration bilaterally. CVS:  RRR.  2/6 SEM over RUSB and LUSB. ABD/GI/GU: BS+. Abd soft, NTND.  MSK/EXT:  Significant muscle mass  and subcu fat loss. SKIN: Dressing over right hip DTI. NEURO: Awake.  Morning.  Bilateral tremor.  PSYCH: Appears anxious.  Sch Meds:  Scheduled Meds:  acetaminophen   1,000 mg Oral Q8H   aspirin   81 mg Oral BID   busPIRone   10 mg  Oral BID   docusate sodium   100 mg Oral BID   PARoxetine   40 mg Oral QHS   temazepam   30 mg Oral QHS   Continuous Infusions: PRN Meds:.acetaminophen  **OR** acetaminophen , HYDROmorphone  (DILAUDID ) injection, menthol  **OR** phenol, methocarbamol  **OR** [DISCONTINUED] methocarbamol  (ROBAXIN ) injection, ondansetron  **OR** ondansetron  (ZOFRAN ) IV, oxyCODONE   Antimicrobials: Anti-infectives (From admission, onward)    Start     Dose/Rate Route Frequency Ordered Stop   06/22/24 1000  sulfamethoxazole -trimethoprim  (BACTRIM  DS) 800-160 MG per tablet 1 tablet  Status:  Discontinued        1 tablet Oral Every 12 hours 06/22/24 0859 06/24/24 1147   06/19/24 2200  sulfamethoxazole -trimethoprim  (BACTRIM  DS) 800-160 MG per tablet 1 tablet  Status:  Discontinued        1 tablet Oral Every 12 hours 06/19/24 1706 06/22/24 0859   06/19/24 0000  sulfamethoxazole -trimethoprim  (BACTRIM  DS) 800-160 MG tablet  Status:  Discontinued        1 tablet Oral Every 12 hours 06/19/24 1707 06/24/24    06/16/24 2245  ceFAZolin  (ANCEF ) IVPB 2g/100 mL premix        2 g 200 mL/hr over 30 Minutes Intravenous Every 6 hours 06/16/24 2152 06/17/24 1406   06/16/24 1245  ceFAZolin  (ANCEF ) IVPB 2g/100 mL premix        2 g 200 mL/hr over 30 Minutes Intravenous On call to O.R. 06/16/24 1230 06/16/24 1450   06/16/24 1231  ceFAZolin  (ANCEF ) 2-4 GM/100ML-% IVPB       Note to Pharmacy: Courtney Avila D: cabinet override      06/16/24 1231 06/16/24 1458   06/16/24 1000  cefTRIAXone  (ROCEPHIN ) 1 g in sodium chloride  0.9 % 100 mL IVPB        1 g 200 mL/hr over 30 Minutes Intravenous Every 24 hours 06/16/24 0938 06/18/24 1238        I have personally reviewed the following labs and images: CBC: Recent Labs  Lab 06/29/24 0404 07/01/24 0431  WBC 7.7 9.1  HGB 8.4* 8.4*  HCT 25.9* 26.3*  MCV 97.7 97.4  PLT 589* 554*   BMP &GFR Recent Labs  Lab 06/29/24 0404  NA 140  K 4.3  CL 107  CO2 26  GLUCOSE 93  BUN 13   CREATININE 0.52  CALCIUM 8.4*  MG 2.3   CrCl cannot be calculated (Unknown ideal weight.). Liver & Pancreas: Recent Labs  Lab 06/29/24 0404  AST 29  ALT 12  ALKPHOS 162*  BILITOT 0.3  PROT 5.2*  ALBUMIN  2.7*   No results for input(s): LIPASE, AMYLASE in the last 168 hours. No results for input(s): AMMONIA in the last 168 hours. Diabetic: No results for input(s): HGBA1C in the last 72 hours. No results for input(s): GLUCAP in the last 168 hours. Cardiac Enzymes: No results for input(s): CKTOTAL, CKMB, CKMBINDEX, TROPONINI in the last 168 hours. No results for input(s): PROBNP in the last 8760 hours. Coagulation Profile: No results for input(s): INR, PROTIME in the last 168 hours. Thyroid  Function Tests: No results for input(s): TSH, T4TOTAL, FREET4, T3FREE, THYROIDAB in the last 72 hours. Lipid Profile: No results for input(s): CHOL, HDL, LDLCALC, TRIG, CHOLHDL, LDLDIRECT in the last 72 hours. Anemia Panel: No  results for input(s): VITAMINB12, FOLATE, FERRITIN, TIBC, IRON, RETICCTPCT in the last 72 hours. Urine analysis:    Component Value Date/Time   COLORURINE YELLOW 06/15/2024 2033   APPEARANCEUR HAZY (A) 06/15/2024 2033   LABSPEC 1.029 06/15/2024 2033   PHURINE 5.0 06/15/2024 2033   GLUCOSEU NEGATIVE 06/15/2024 2033   HGBUR NEGATIVE 06/15/2024 2033   BILIRUBINUR NEGATIVE 06/15/2024 2033   KETONESUR NEGATIVE 06/15/2024 2033   PROTEINUR 30 (A) 06/15/2024 2033   NITRITE NEGATIVE 06/15/2024 2033   LEUKOCYTESUR NEGATIVE 06/15/2024 2033   Sepsis Labs: Invalid input(s): PROCALCITONIN, LACTICIDVEN  Microbiology: No results found for this or any previous visit (from the past 240 hours).  Radiology Studies: No results found.    Tmya Wigington T. Latrice Storlie [facility]  If 7PM-7AM, please contact night-coverage [email or web address] 07/04/2024, 2:10 PM   "

## 2024-07-05 DIAGNOSIS — S065XAA Traumatic subdural hemorrhage with loss of consciousness status unknown, initial encounter: Secondary | ICD-10-CM | POA: Diagnosis not present

## 2024-07-05 NOTE — Plan of Care (Signed)

## 2024-07-05 NOTE — Progress Notes (Signed)
 [facility] PROGRESS NOTE    Progress Note  Courtney Avila  FMW:[id number] DOB: 10/02/1940 DOA: 06/15/2024 PCP: [name], NP     Brief Narrative:   Courtney Avila is an 83 y.o. 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  Neurosurgery was consulted recommended conservative management. Assessment/Plan:   Subdural hematoma (HCC) 2 CT scan 8 hours apart showed stable subdural hematoma. Discussed with neurosurgery recommended no further treatment they recommended to initiate DVT prophylaxis for hip surgery on 06/17/2024. Denied placement by insurance.  Social worker talk with family to discuss options.  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 Narcotics and DVT prophylaxis per orthopedic doctor. Orthopedic surgery recommended to keep knee immobilizer on to keep the leg straight short-term. Aspirin  twice a day for DVT prophylaxis. Orthopedic surgery recommended a round of Bactrim  for 1 week. Start transitioning from IV narcotics to orals.  Unwitnessed fall: Etiology unclear with a history of dementia history is limited. Evaluated by PT will need skilled nursing facility placement.  Postop acute blood loss anemia: Hemoglobin postoperatively dropped to 6.1. Transfused 2 units packed red blood cells. Hemoglobin this morning is 9.2.  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 Has been calm throughout her hospital stay no agitation.   Insomnia: Continue Restoril .  Anxiety and mood disorder: Continue BuSpar .  Oral intake: On D5.  Goals of care: She is currently DNR/DNI.  Concern for UTI: UA was unremarkable. Blood  cultures remain negative till date she completed a 3-day course of IV antibiotics in house.  1/2 blood cultures positive for staph epi: Likely contaminant.   DVT prophylaxis: scd Family Communication:none Status is: Inpatient Remains inpatient appropriate because: Right hip fracture    Code Status:     Code Status Orders  (From admission, onward)           Start     Ordered   06/16/24 0049  Do not attempt resuscitation (DNR)- Limited -Do Not Intubate (DNI)  Continuous       Question Answer Comment  If pulseless and not breathing No CPR or chest compressions.   In Pre-Arrest Conditions (Patient Is Breathing and Has A Pulse) Do not intubate. Provide all appropriate non-invasive medical interventions. Avoid ICU transfer unless indicated or required.   Consent: Discussion documented in EHR or advanced directives reviewed      06/16/24 0049           Code Status History     Date Active Date Inactive Code Status Order ID Comments User Context   01/24/2024 1529 06/15/2024 1835 DNR [id number]  [name], NP Outpatient   12/07/2016 1636 12/09/2016 1730 Full Code [id number]  Melodi Lerner, MD Inpatient   04/09/2016 1215 04/10/2016 2031 Full Code [id number]  Garland Sora, PA-C Inpatient         IV Access:   Peripheral IV   Procedures and diagnostic studies:   No results found.    Medical Consultants:   None.   Subjective:    Courtney Avila no complaints  Objective:    Vitals:   07/04/24 1405 07/04/24 1928 07/05/24 0557 07/05/24 1118  BP: (!) 127/100 111/84 (!) 103/41 (!) 153/97  Pulse: 74 60 (!) 57 ROLLEN)  59  Resp: 16 18 18 18   Temp: 98.8 F (37.1 C) 98 F (36.7 C) 97.7 F (36.5 C) 98.1 F (36.7 C)  TempSrc: Oral Oral Oral Oral  SpO2: 98% 100% 99% 100%   SpO2: 100 % O2 Flow Rate (L/min): 3 L/min   Intake/Output Summary (Last 24 hours) at 07/05/2024 1155 Last data filed at 07/05/2024 0830 Gross per 24 hour  Intake 240 ml  Output --  Net  240 ml   There were no vitals filed for this visit.  Exam: General exam: In no acute distress. Respiratory system: Good air movement and clear to auscultation. Cardiovascular system: S1 & S2 heard, RRR. No JVD. Gastrointestinal system: Abdomen is nondistended, soft and nontender.  Extremities: No pedal edema. Skin: No rashes, lesions or ulcers Psychiatry: Judgement and insight appear normal. Mood & affect appropriate.  Data Reviewed:    Labs: Basic Metabolic Panel: Recent Labs  Lab 06/29/24 0404  NA 140  K 4.3  CL 107  CO2 26  GLUCOSE 93  BUN 13  CREATININE 0.52  CALCIUM 8.4*  MG 2.3   GFR CrCl cannot be calculated (Unknown ideal weight.). Liver Function Tests: Recent Labs  Lab 06/29/24 0404  AST 29  ALT 12  ALKPHOS 162*  BILITOT 0.3  PROT 5.2*  ALBUMIN  2.7*   No results for input(s): LIPASE, AMYLASE in the last 168 hours.  No results for input(s): AMMONIA in the last 168 hours. Coagulation profile No results for input(s): INR, PROTIME in the last 168 hours.  COVID-19 Labs  No results for input(s): DDIMER, FERRITIN, LDH, CRP in the last 72 hours.  Lab Results  Component Value Date   SARSCOV2NAA NEGATIVE 06/15/2024   SARSCOV2NAA NEGATIVE 08/04/2022    CBC: Recent Labs  Lab 06/29/24 0404 07/01/24 0431  WBC 7.7 9.1  HGB 8.4* 8.4*  HCT 25.9* 26.3*  MCV 97.7 97.4  PLT 589* 554*   Cardiac Enzymes: No results for input(s): CKTOTAL, CKMB, CKMBINDEX, TROPONINI in the last 168 hours.  BNP (last 3 results) No results for input(s): PROBNP in the last 8760 hours. CBG: No results for input(s): GLUCAP in the last 168 hours. D-Dimer: No results for input(s): DDIMER in the last 72 hours. Hgb A1c: No results for input(s): HGBA1C in the last 72 hours. Lipid Profile: No results for input(s): CHOL, HDL, LDLCALC, TRIG, CHOLHDL, LDLDIRECT in the last 72 hours. Thyroid  function studies: No results for input(s):  TSH, T4TOTAL, T3FREE, THYROIDAB in the last 72 hours.  Invalid input(s): FREET3 Anemia work up: No results for input(s): VITAMINB12, FOLATE, FERRITIN, TIBC, IRON, RETICCTPCT in the last 72 hours. Sepsis Labs: Recent Labs  Lab 06/29/24 0404 07/01/24 0431  WBC 7.7 9.1   Microbiology No results found for this or any previous visit (from the past 240 hours).    Medications:    acetaminophen   1,000 mg Oral Q8H   aspirin   81 mg Oral BID   busPIRone   10 mg Oral BID   docusate sodium   100 mg Oral BID   PARoxetine   40 mg Oral QHS   temazepam   30 mg Oral QHS   Continuous Infusions:      LOS: 19 days   Erle Odell Castor  [facility]  07/05/2024, 11:55 AM

## 2024-07-05 NOTE — Care Management Important Message (Signed)
 Important Message  Patient Details IM Letter given. Name: Courtney Avila MRN: [id number] Date of Birth: 10/30/1940   Important Message Given:  Yes - Medicare IM     Melba Ates 07/05/2024, 12:01 PM

## 2024-07-06 DIAGNOSIS — S065XAA Traumatic subdural hemorrhage with loss of consciousness status unknown, initial encounter: Secondary | ICD-10-CM | POA: Diagnosis not present

## 2024-07-06 NOTE — Plan of Care (Signed)
  Problem: Clinical Measurements: Goal: Will remain free from infection Outcome: Progressing Goal: Diagnostic test results will improve Outcome: Progressing Goal: Cardiovascular complication will be avoided Outcome: Progressing   Problem: Coping: Goal: Level of anxiety will decrease Outcome: Progressing

## 2024-07-06 NOTE — Progress Notes (Signed)
 [facility] PROGRESS NOTE    Progress Note  Courtney Avila  FMW:[id number] DOB: Sep 30, 1940 DOA: 06/15/2024 PCP: [name], NP     Brief Narrative:   Courtney Avila is an 84 y.o. 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  Neurosurgery was consulted recommended conservative management. Assessment/Plan:   Subdural hematoma (HCC) 2 CT scan 8 hours apart showed stable subdural hematoma. Discussed with neurosurgery recommended no further treatment they recommended to initiate DVT prophylaxis for hip surgery on 06/17/2024. Denied placement by insurance.  Social worker talk with family to discuss options.  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 Narcotics and DVT prophylaxis per orthopedic doctor. Orthopedic surgery recommended to keep knee immobilizer on to keep the leg straight short-term. Aspirin  twice a day for DVT prophylaxis. Orthopedic surgery recommended a round of Bactrim  for 1 week. Start transitioning from IV narcotics to orals.  Unwitnessed fall: Etiology unclear with a history of dementia history is limited. Evaluated by PT will need skilled nursing facility placement.  Postop acute blood loss anemia: Hemoglobin postoperatively dropped to 6.1. Transfused 2 units packed red blood cells. Hemoglobin this morning is 9.2.  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 Has been calm throughout her hospital stay no agitation.   Insomnia: Continue Restoril .  Anxiety and mood disorder: Continue BuSpar .  Oral intake: On D5.  Goals of care: She is currently DNR/DNI.  Concern for UTI: UA was unremarkable. Blood  cultures remain negative till date she completed a 3-day course of IV antibiotics in house.  1/2 blood cultures positive for staph epi: Likely contaminant.   DVT prophylaxis: scd Family Communication:none Status is: Inpatient Remains inpatient appropriate because: Right hip fracture    Code Status:     Code Status Orders  (From admission, onward)           Start     Ordered   06/16/24 0049  Do not attempt resuscitation (DNR)- Limited -Do Not Intubate (DNI)  Continuous       Question Answer Comment  If pulseless and not breathing No CPR or chest compressions.   In Pre-Arrest Conditions (Patient Is Breathing and Has A Pulse) Do not intubate. Provide all appropriate non-invasive medical interventions. Avoid ICU transfer unless indicated or required.   Consent: Discussion documented in EHR or advanced directives reviewed      06/16/24 0049           Code Status History     Date Active Date Inactive Code Status Order ID Comments User Context   01/24/2024 1529 06/15/2024 1835 DNR [id number]  [name], NP Outpatient   12/07/2016 1636 12/09/2016 1730 Full Code [id number]  Melodi Lerner, MD Inpatient   04/09/2016 1215 04/10/2016 2031 Full Code [id number]  Garland Sora, PA-C Inpatient         IV Access:   Peripheral IV   Procedures and diagnostic studies:   No results found.    Medical Consultants:   None.   Subjective:    Courtney Avila no complaints  Objective:    Vitals:   07/05/24 0557 07/05/24 1118 07/05/24 2022 07/06/24 0512  BP: (!) 103/41 (!) 153/97 (!) 131/53 119/60  Pulse: (!) 57 (!) 59  72 70  Resp: 18 18 20 18   Temp: 97.7 F (36.5 C) 98.1 F (36.7 C) 98 F (36.7 C) 98.6 F (37 C)  TempSrc: Oral Oral  Oral  SpO2: 99% 100% 97% 98%   SpO2: 98 % O2 Flow Rate (L/min): 3 L/min   Intake/Output Summary (Last 24 hours) at 07/06/2024 1043 Last data filed at 07/05/2024 1900 Gross per 24 hour  Intake 600 ml  Output --  Net 600 ml    There were no vitals filed for this visit.  Exam: General exam: In no acute distress. Respiratory system: Good air movement and clear to auscultation. Cardiovascular system: S1 & S2 heard, RRR. No JVD. Gastrointestinal system: Abdomen is nondistended, soft and nontender.  Extremities: No pedal edema. Skin: No rashes, lesions or ulcers Psychiatry: Judgement and insight appear normal. Mood & affect appropriate.  Data Reviewed:    Labs: Basic Metabolic Panel: No results for input(s): NA, K, CL, CO2, GLUCOSE, BUN, CREATININE, CALCIUM, MG, PHOS in the last 168 hours.  GFR CrCl cannot be calculated (Unknown ideal weight.). Liver Function Tests: No results for input(s): AST, ALT, ALKPHOS, BILITOT, PROT, ALBUMIN  in the last 168 hours.  No results for input(s): LIPASE, AMYLASE in the last 168 hours.  No results for input(s): AMMONIA in the last 168 hours. Coagulation profile No results for input(s): INR, PROTIME in the last 168 hours.  COVID-19 Labs  No results for input(s): DDIMER, FERRITIN, LDH, CRP in the last 72 hours.  Lab Results  Component Value Date   SARSCOV2NAA NEGATIVE 06/15/2024   SARSCOV2NAA NEGATIVE 08/04/2022    CBC: Recent Labs  Lab 07/01/24 0431  WBC 9.1  HGB 8.4*  HCT 26.3*  MCV 97.4  PLT 554*   Cardiac Enzymes: No results for input(s): CKTOTAL, CKMB, CKMBINDEX, TROPONINI in the last 168 hours.  BNP (last 3 results) No results for input(s): PROBNP in the last 8760 hours. CBG: No results for input(s): GLUCAP in the last 168 hours. D-Dimer: No results for input(s): DDIMER in the last 72 hours. Hgb A1c: No results for input(s): HGBA1C in the last 72 hours. Lipid Profile: No results for input(s): CHOL, HDL, LDLCALC, TRIG, CHOLHDL, LDLDIRECT in the last 72 hours. Thyroid  function studies: No results for input(s): TSH, T4TOTAL, T3FREE, THYROIDAB in the last 72  hours.  Invalid input(s): FREET3 Anemia work up: No results for input(s): VITAMINB12, FOLATE, FERRITIN, TIBC, IRON, RETICCTPCT in the last 72 hours. Sepsis Labs: Recent Labs  Lab 07/01/24 0431  WBC 9.1   Microbiology No results found for this or any previous visit (from the past 240 hours).    Medications:    acetaminophen   1,000 mg Oral Q8H   aspirin   81 mg Oral BID   busPIRone   10 mg Oral BID   docusate sodium   100 mg Oral BID   PARoxetine   40 mg Oral QHS   temazepam   30 mg Oral QHS   Continuous Infusions:      LOS: 20 days   Courtney Avila  [facility]  07/06/2024, 10:43 AM

## 2024-07-06 NOTE — Plan of Care (Signed)

## 2024-07-07 DIAGNOSIS — S065XAA Traumatic subdural hemorrhage with loss of consciousness status unknown, initial encounter: Secondary | ICD-10-CM | POA: Diagnosis not present

## 2024-07-07 DIAGNOSIS — S72141A Displaced intertrochanteric fracture of right femur, initial encounter for closed fracture: Principal | ICD-10-CM

## 2024-07-07 DIAGNOSIS — W19XXXA Unspecified fall, initial encounter: Secondary | ICD-10-CM

## 2024-07-07 DIAGNOSIS — F02C0 Dementia in other diseases classified elsewhere, severe, without behavioral disturbance, psychotic disturbance, mood disturbance, and anxiety: Secondary | ICD-10-CM

## 2024-07-07 DIAGNOSIS — S72041A Displaced fracture of base of neck of right femur, initial encounter for closed fracture: Secondary | ICD-10-CM

## 2024-07-07 DIAGNOSIS — Z7189 Other specified counseling: Secondary | ICD-10-CM

## 2024-07-07 DIAGNOSIS — Z515 Encounter for palliative care: Secondary | ICD-10-CM

## 2024-07-07 DIAGNOSIS — G309 Alzheimer's disease, unspecified: Secondary | ICD-10-CM

## 2024-07-07 DIAGNOSIS — S32010A Wedge compression fracture of first lumbar vertebra, initial encounter for closed fracture: Secondary | ICD-10-CM

## 2024-07-07 NOTE — Progress Notes (Signed)
 Physical Therapy Treatment Patient Details Name: Courtney Avila MRN: [id number] DOB: [birth year] Today's Date: 07/07/2024   History of Present Illness 84 y.o. female was brought by EMS from home with concern for possible UTI by family. Patient apparently has had generalized pain since she had a fall 2 days ago. She is also having pelvic pain that the family thought was possibly UTI. She is normally ambulatory and can communicate despite advanced dementia. She had a fall 2 prior to admission and since that she has been less ambulatory. on Evaluation in ED pt was determined to have a 7 mm subdural hematoma,  right intertrochanteric fracture,  and and L1 compression fracture all presumed to be from the fall. Orthopedics and neurosurgery were both consulted PMH: Alzheimer's dementia, osteoarthritis, GERD, anxiety disorder, hypertension   was brought by EMS from home with concern for possible UTI by family.   Orthopedics and neurosurgery were both consulted. pt is s/p R DA THA    PT Comments  Pt asleep in bed with BLE in fetal position and adducted together with pillow between, awakens easily, premedicated. Pt noted to be soiled in urine and BM. RN into room providing pericare and linen change. Pt needing total A to roll R/L, pt unable to follow commands for sequencing or hand positioning, short quick breaths requiring max cues for slow long breaths. Therapist attempted to abduct BLE to neutral hip position out of adducted together but unsuccessful. Pt with LLE flexed maximally up into chest, unable to extend hip/knee at all despite tapping and gentle techniques, pt with strong withdrawal, moaning in pain. Pt with RLE slightly more extended than LLE but unable to encourage extension at hip or knee with similar response. Pt's bil ankles in plantarflexed position, R ankle near neutral passively and L ankle lacking ~10 deg from neutral passively. Pt unable to follow commands, unintelligible speech with intermittent  you are beautiful and I love you to therapist and nurse in room.  Acute PT therapy signing off at this time as pt is unable to tolerate PT interventions, requiring total A+2 for all mobility and appears anxious and painful requiring multimodal cues. Recommend 24/7 assistance at d/c.   If plan is discharge home, recommend the following: Two people to help with bathing/dressing/bathroom;Help with stairs or ramp for entrance;Assist for transportation;Two people to help with walking and/or transfers   Can travel by private vehicle        Equipment Recommendations  Deitra lift    Recommendations for Other Services       Precautions / Restrictions Precautions Precautions: Fall Precaution/Restrictions Comments: L>R kegs flexed and dooes not tolerate any extension attempts, KI [employer] Restrictions RLE Weight Bearing Per Provider Order: Weight bearing as tolerated     Mobility  Bed Mobility Overal bed mobility: Needs Assistance Bed Mobility: Rolling Rolling: Total assist, +2 for physical assistance, +2 for safety/equipment         General bed mobility comments: total A+2 wtih bed pad to roll R/L, nursing present providing linen change and pericare, unable to promote hip/knee extension out of fetal position and adducted tightly together with pillow between legs, LLE more flexed than RLE    Transfers                        Ambulation/Gait                   Stairs  Wheelchair Mobility     Tilt Bed    Modified Rankin (Stroke Patients Only)       Balance Overall balance assessment: History of Falls                                          Communication Communication Communication: Impaired Factors Affecting Communication: Reduced clarity of speech  Cognition Arousal: Alert Behavior During Therapy: Anxious, Restless   PT - Cognitive impairments: No family/caregiver present to determine baseline                        PT - Cognition Comments: pt with labored breathing and appears anxious, cued for slow breaths with fair response, reports you are beautiful and I love you do different providers in room but otherwise unintelligble speech Following commands: Impaired Following commands impaired: Follows one step commands inconsistently    Cueing Cueing Techniques: Verbal cues, Tactile cues, Gestural cues, Visual cues  Exercises      General Comments        Pertinent Vitals/Pain Pain Assessment Pain Assessment: Faces Faces Pain Scale: Hurts whole lot Pain Location: generalized with movement Pain Descriptors / Indicators: Grimacing, Guarding, Discomfort, Moaning, Crying Pain Intervention(s): Limited activity within patient's tolerance, Monitored during session, Premedicated before session, Repositioned    Home Living                          Prior Function            PT Goals (current goals can now be found in the care plan section) Acute Rehab PT Goals PT Goal Formulation: All assessment and education complete, DC therapy Progress towards PT goals: Not progressing toward goals - comment (pt not progressing, d/c PT)    Frequency    Min 2X/week      PT Plan      Co-evaluation              AM-PAC PT 6 Clicks Mobility   Outcome Measure  Help needed turning from your back to your side while in a flat bed without using bedrails?: Total Help needed moving from lying on your back to sitting on the side of a flat bed without using bedrails?: Total Help needed moving to and from a bed to a chair (including a wheelchair)?: Total Help needed standing up from a chair using your arms (e.g., wheelchair or bedside chair)?: Total Help needed to walk in hospital room?: Total Help needed climbing 3-5 steps with a railing? : Total 6 Click Score: 6    End of Session   Activity Tolerance: Patient limited by pain;Other (comment) (anxiousness) Patient left: in bed;with  call bell/phone within reach;with bed alarm set;with nursing/sitter in room Nurse Communication: Mobility status;Need for lift equipment PT Visit Diagnosis: Other abnormalities of gait and mobility (R26.89);Pain;Difficulty in walking, not elsewhere classified (R26.2);Adult, failure to thrive (R62.7) Pain - Right/Left: Left Pain - part of body: Leg;Knee;Hip     Time: 9076-9056 PT Time Calculation (min) (ACUTE ONLY): 20 min  Charges:    $Therapeutic Activity: 8-22 mins PT General Charges $$ ACUTE PT VISIT: 1 Visit                     Tori Deicy Rusk PT, DPT 07/07/2024, 9:55 AM

## 2024-07-07 NOTE — Plan of Care (Signed)
  Problem: Education: Goal: Knowledge of General Education information will improve Description: Including pain rating scale, medication(s)/side effects and non-pharmacologic comfort measures Outcome: Not Progressing   Problem: Health Behavior/Discharge Planning: Goal: Ability to manage health-related needs will improve Outcome: Progressing   Problem: Clinical Measurements: Goal: Ability to maintain clinical measurements within normal limits will improve Outcome: Progressing Goal: Will remain free from infection Outcome: Progressing Goal: Diagnostic test results will improve Outcome: Progressing Goal: Respiratory complications will improve Outcome: Progressing Goal: Cardiovascular complication will be avoided Outcome: Progressing

## 2024-07-07 NOTE — Progress Notes (Signed)
" °  Daily Progress Note   Patient Name: Courtney Avila       Date: 07/07/2024 DOB: 07/06/1941  Age: 84 y.o. MRN#: [id number] Attending Physician: Odell Celinda Balo, MD Primary Care Physician: [name], NP Admit Date: 06/15/2024 Length of Stay: 21 days  Reason for Consultation/Follow-up: Establishing goals of care  Subjective:   Reviewed EMR including recent documentation from hospitalist and TOC.  Son had been appealing rehab denial though unfortunately this has been upheld.  TOC now assisting with long-term care placement coordination.  Presented to bedside to see patient.  No visitors present at bedside.  Patient laying comfortably in bed without signs of distress.  Patient able to interact at times with simple yes or no questions though overall pleasantly confused.  Discussed care with hospitalist, bedside RN, and TOC to coordinate care.  Palliative medicine team continuing to follow along with patient's medical journey.  Objective:   Vital Signs:  BP (!) 114/57 (BP Location: Right Arm)   Pulse 68   Temp 98.1 F (36.7 C) (Oral)   Resp 18   SpO2 95%   Physical Exam: General: NAD, awake, pleasantly confused, chronically ill-appearing, cachectic, frail Cardiovascular: RRR Respiratory: no increased work of breathing noted, not in respiratory distress Abdomen: not distended Extremities: Bilateral lower extremity contractures noted Neuro: Pleasantly confused  Assessment & Plan:   Assessment: Patient is an 84 year old female with a past medical history of hypertension, dementia, insomnia, and anxiety and mood disorder who was admitted on 06/15/2024 after an unwitnessed fall that happened 2 days prior to admission.  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  Neurosurgery also consulted and recommended conservative management.  Palliative medicine team consulted  to assist with complex medical decision making.  Recommendations/Plan: # Complex medical decision making/goals of care:  - Patient unable to participate in complex medical decision making due to underlying medical status.  Patient did not appear to be in any distress or discomfort when seen today.   - Unfortunately denial was upheld regarding rehab refusal and so now son asking for TOC's assistance with information regarding LTC placement. Courtney Avila recommended to contact multiple LTC facilities regarding placement.  At this time continuing appropriate management.  Patient already appropriately DNR/DNI.  Palliative medicine team continuing to follow with patient's medical journey.  - Patient's son, Courtney Avila, had been working to get patient to rehab by appealing denial.  -  Code Status: Limited: Do not attempt resuscitation (DNR) -DNR-LIMITED -Do Not Intubate/DNI   # Psychosocial Support:  - Courtney Avila  # Discharge Planning: To Be Determined  - TOC assisting with coordination of discharge planning  Discussed with: Hospitalist, TOC, bedside RN  Thank you for allowing the palliative care team to participate in the care Courtney Avila.  Tinnie Radar, DO Palliative Care Provider PMT # [phone]  If patient remains symptomatic despite maximum doses, please call PMT at [phone] between 0700 and 1900. Outside of these hours, please call attending, as PMT does not have night coverage. "

## 2024-07-07 NOTE — Plan of Care (Signed)
" °  Problem: Clinical Measurements: Goal: Will remain free from infection Outcome: Progressing Goal: Cardiovascular complication will be avoided Outcome: Progressing   Problem: Elimination: Goal: Will not experience complications related to urinary retention Outcome: Progressing   Problem: Skin Integrity: Goal: Risk for impaired skin integrity will decrease Outcome: Progressing   "

## 2024-07-07 NOTE — TOC Progression Note (Addendum)
 Transition of Care [facility]) - Progression Note    Patient Details  Name: Courtney Avila MRN: [id number] Date of Birth: 24-Mar-1941  Transition of [facility]) CM/SW Contact  Doneta Glenys DASEN, RN Phone Number: 07/07/2024, 10:20 AM  Clinical Narrative:    3:00 PM [name] returned call. CM updated [name] on denial for SNF and ask for discharge plan. [name] wanting LTC, CM advised him to contact multiple LTC for placement. [name] informed CM that insurance is now [employer] and requested copy. CM called [employer] - will follow up on 07/10/2024. Levon will need to check with [employer] financial office manager-not back until 07/10/2024. 2:49 PM CM called [name] son left message. CM called Penne grand son left message. 10:40 AM CM received call back from [employer] - Level 2 appeal was upheld for denial for SNF on 07/05/2024.  CM called Darinson [phone], left message.  10:20 AM CM called [employer] for update on Level 2. Jori not able to see decision for Level 2. CM called appeal department [phone], option # 3, left message.   Expected Discharge Plan: Skilled Nursing Facility Barriers to Discharge: Continued Medical Work up, SNF Pending bed offer, Insurance Authorization               Expected Discharge Plan and Services       Living arrangements for the past 2 months: Single Family Home                                       Social Drivers of Health (SDOH) Interventions SDOH Screenings   Food Insecurity: No Food Insecurity (06/17/2024)  Housing: Low Risk (06/27/2024)  Transportation Needs: No Transportation Needs (06/17/2024)  Utilities: Not At Risk (06/17/2024)  Depression (PHQ2-9): Low Risk (05/01/2024)  Social Connections: Socially Isolated (06/17/2024)  Tobacco Use: Low Risk (06/16/2024)    Readmission Risk Interventions     No data to display

## 2024-07-07 NOTE — Progress Notes (Signed)
 [facility] PROGRESS NOTE    Progress Note  Courtney Avila  FMW:[id number] DOB: Jun 05, 1941 DOA: 06/15/2024 PCP: [name], NP     Brief Narrative:   Courtney Avila is an 84 y.o. 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  Neurosurgery was consulted recommended conservative management. Assessment/Plan:   Subdural hematoma (HCC) 2 CT scan 8 hours apart showed stable subdural hematoma. Discussed with neurosurgery recommended no further treatment they recommended to initiate DVT prophylaxis for hip surgery on 06/17/2024. Denied placement by insurance.  Family appealing. 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 Narcotics and DVT prophylaxis per orthopedic doctor. Orthopedic surgery recommended to keep knee immobilizer on to keep the leg straight short-term. Aspirin  twice a day for DVT prophylaxis. Orthopedic surgery recommended a round of Bactrim  for 1 week. Start transitioning from IV narcotics to orals.  Unwitnessed fall: Etiology unclear with a history of dementia history is limited. Evaluated by PT will need skilled nursing facility placement.  Postop acute blood loss anemia: Hemoglobin postoperatively dropped to 6.1. Transfused 2 units packed red blood cells. Hemoglobin this morning is 9.2.  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 Has been calm throughout her hospital stay no agitation.   Insomnia: Continue Restoril .  Anxiety and mood disorder: Continue BuSpar .  Oral intake: On D5.  Goals of care: She is currently DNR/DNI.  Concern for UTI: UA was  unremarkable. Blood cultures remain negative till date she completed a 3-day course of IV antibiotics in house.  1/2 blood cultures positive for staph epi: Likely contaminant.   DVT prophylaxis: scd Family Communication:none Status is: Inpatient Remains inpatient appropriate because: Right hip fracture    Code Status:     Code Status Orders  (From admission, onward)           Start     Ordered   06/16/24 0049  Do not attempt resuscitation (DNR)- Limited -Do Not Intubate (DNI)  Continuous       Question Answer Comment  If pulseless and not breathing No CPR or chest compressions.   In Pre-Arrest Conditions (Patient Is Breathing and Has A Pulse) Do not intubate. Provide all appropriate non-invasive medical interventions. Avoid ICU transfer unless indicated or required.   Consent: Discussion documented in EHR or advanced directives reviewed      06/16/24 0049           Code Status History     Date Active Date Inactive Code Status Order ID Comments User Context   01/24/2024 1529 06/15/2024 1835 DNR [id number]  [name], NP Outpatient   12/07/2016 1636 12/09/2016 1730 Full Code [id number]  Melodi Lerner, MD Inpatient   04/09/2016 1215 04/10/2016 2031 Full Code [id number]  Garland Sora, PA-C Inpatient         IV Access:   Peripheral IV   Procedures and diagnostic studies:   No results found.    Medical Consultants:   None.   Subjective:    Courtney Avila no complaints  Objective:    Vitals:   07/06/24 1952 07/06/24 2018 07/07/24 0535 07/07/24 0558  BP:  (!) 113/57 (!) 120/55 (!) 114/57  Pulse: 70  63 68  Resp: 18  19 18   Temp: 98.6 F (37 C)  98.2 F (36.8 C) 98.1 F (36.7 C)  TempSrc: Oral  Oral Oral  SpO2: 99%  99% 95%   SpO2: 95 % O2 Flow Rate (L/min): 3 L/min  No intake or output data in the 24 hours ending 07/07/24 0938  There were no vitals filed for this visit.  Exam: General exam: In no acute distress. Respiratory  system: Good air movement and clear to auscultation. Cardiovascular system: S1 & S2 heard, RRR. No JVD. Gastrointestinal system: Abdomen is nondistended, soft and nontender.  Extremities: No pedal edema. Skin: No rashes, lesions or ulcers Psychiatry: Judgement and insight appear normal. Mood & affect appropriate.  Data Reviewed:    Labs: Basic Metabolic Panel: No results for input(s): NA, K, CL, CO2, GLUCOSE, BUN, CREATININE, CALCIUM, MG, PHOS in the last 168 hours.  GFR CrCl cannot be calculated (Unknown ideal weight.). Liver Function Tests: No results for input(s): AST, ALT, ALKPHOS, BILITOT, PROT, ALBUMIN  in the last 168 hours.  No results for input(s): LIPASE, AMYLASE in the last 168 hours.  No results for input(s): AMMONIA in the last 168 hours. Coagulation profile No results for input(s): INR, PROTIME in the last 168 hours.  COVID-19 Labs  No results for input(s): DDIMER, FERRITIN, LDH, CRP in the last 72 hours.  Lab Results  Component Value Date   SARSCOV2NAA NEGATIVE 06/15/2024   SARSCOV2NAA NEGATIVE 08/04/2022    CBC: Recent Labs  Lab 07/01/24 0431  WBC 9.1  HGB 8.4*  HCT 26.3*  MCV 97.4  PLT 554*   Cardiac Enzymes: No results for input(s): CKTOTAL, CKMB, CKMBINDEX, TROPONINI in the last 168 hours.  BNP (last 3 results) No results for input(s): PROBNP in the last 8760 hours. CBG: No results for input(s): GLUCAP in the last 168 hours. D-Dimer: No results for input(s): DDIMER in the last 72 hours. Hgb A1c: No results for input(s): HGBA1C in the last 72 hours. Lipid Profile: No results for input(s): CHOL, HDL, LDLCALC, TRIG, CHOLHDL, LDLDIRECT in the last 72 hours. Thyroid  function studies: No results for input(s): TSH, T4TOTAL, T3FREE, THYROIDAB in the last 72 hours.  Invalid input(s): FREET3 Anemia work up: No results for input(s): VITAMINB12, FOLATE,  FERRITIN, TIBC, IRON, RETICCTPCT in the last 72 hours. Sepsis Labs: Recent Labs  Lab 07/01/24 0431  WBC 9.1   Microbiology No results found for this or any previous visit (from the past 240 hours).    Medications:    acetaminophen   1,000 mg Oral Q8H   aspirin   81 mg Oral BID   busPIRone   10 mg Oral BID   docusate sodium   100 mg Oral BID   PARoxetine   40 mg Oral QHS   temazepam   30 mg Oral QHS   Continuous Infusions:      LOS: 21 days   Erle Odell Castor  [facility]  07/07/2024, 9:38 AM

## 2024-07-08 DIAGNOSIS — S065XAA Traumatic subdural hemorrhage with loss of consciousness status unknown, initial encounter: Secondary | ICD-10-CM | POA: Diagnosis not present

## 2024-07-08 NOTE — Plan of Care (Signed)

## 2024-07-08 NOTE — Plan of Care (Signed)
 All staples removed R hip

## 2024-07-08 NOTE — Progress Notes (Signed)
 [facility] PROGRESS NOTE    Progress Note  Courtney Avila  FMW:[id number] DOB: [birth year] DOA: 06/15/2024 PCP: [name], NP     Brief Narrative:   Courtney Avila is an 84 y.o. 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  Neurosurgery was consulted recommended conservative management. Assessment/Plan:   Subdural hematoma (HCC) 2 CT scan 8 hours apart showed stable subdural hematoma. Discussed with neurosurgery recommended no further treatment they recommended to initiate DVT prophylaxis for hip surgery on 06/17/2024. Denied placement by insurance.  Family appealing. 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 Narcotics and DVT prophylaxis per orthopedic doctor. Orthopedic surgery recommended to keep knee immobilizer on to keep the leg straight short-term. Aspirin  twice a day for DVT prophylaxis. Orthopedic surgery recommended a round of Bactrim  for 1 week. Start transitioning from IV narcotics to orals.  Unwitnessed fall: Etiology unclear with a history of dementia history is limited. Evaluated by PT will need skilled nursing facility placement.  Postop acute blood loss anemia: Hemoglobin postoperatively dropped to 6.1. Transfused 2 units packed red blood cells. Hemoglobin this morning is 9.2.  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 Has been calm throughout her hospital stay no agitation.   Insomnia: Continue Restoril .  Anxiety and mood disorder: Continue BuSpar .  Oral intake: On D5.  Goals of care: She is currently DNR/DNI.  Concern for UTI: UA was  unremarkable. Blood cultures remain negative till date she completed a 3-day course of IV antibiotics in house.  1/2 blood cultures positive for staph epi: Likely contaminant.   DVT prophylaxis: scd Family Communication:none Status is: Inpatient Remains inpatient appropriate because: Right hip fracture    Code Status:     Code Status Orders  (From admission, onward)           Start     Ordered   06/16/24 0049  Do not attempt resuscitation (DNR)- Limited -Do Not Intubate (DNI)  Continuous       Question Answer Comment  If pulseless and not breathing No CPR or chest compressions.   In Pre-Arrest Conditions (Patient Is Breathing and Has A Pulse) Do not intubate. Provide all appropriate non-invasive medical interventions. Avoid ICU transfer unless indicated or required.   Consent: Discussion documented in EHR or advanced directives reviewed      06/16/24 0049           Code Status History     Date Active Date Inactive Code Status Order ID Comments User Context   01/24/2024 1529 06/15/2024 1835 DNR [id number]  [name], NP Outpatient   12/07/2016 1636 12/09/2016 1730 Full Code [id number]  Melodi Lerner, MD Inpatient   04/09/2016 1215 04/10/2016 2031 Full Code [id number]  Garland Sora, PA-C Inpatient         IV Access:   Peripheral IV   Procedures and diagnostic studies:   No results found.    Medical Consultants:   None.   Subjective:    Courtney Avila no complaints  Objective:    Vitals:   07/07/24 0558 07/07/24 1619 07/07/24 2003 07/08/24 0539  BP: (!) 114/57 (!) 107/49 113/62 (!) 106/55  Pulse: 68 77  70 (!) 58  Resp: 18 20 18 17   Temp: 98.1 F (36.7 C) 98.2 F (36.8 C) 98.2 F (36.8 C) 97.9 F (36.6 C)  TempSrc: Oral Oral Axillary   SpO2: 95% 96% 99% 96%   SpO2: 96 % O2 Flow Rate (L/min): 3 L/min   Intake/Output Summary (Last 24 hours) at 07/08/2024 0942 Last data filed at 07/08/2024 0900 Gross per 24 hour  Intake 520 ml   Output --  Net 520 ml    There were no vitals filed for this visit.  Exam: General exam: In no acute distress. Respiratory system: Good air movement and clear to auscultation. Cardiovascular system: S1 & S2 heard, RRR. No JVD. Gastrointestinal system: Abdomen is nondistended, soft and nontender.  Extremities: No pedal edema. Skin: No rashes, lesions or ulcers Psychiatry: Judgement and insight appear normal. Mood & affect appropriate.  Data Reviewed:    Labs: Basic Metabolic Panel: No results for input(s): NA, K, CL, CO2, GLUCOSE, BUN, CREATININE, CALCIUM, MG, PHOS in the last 168 hours.  GFR CrCl cannot be calculated (Unknown ideal weight.). Liver Function Tests: No results for input(s): AST, ALT, ALKPHOS, BILITOT, PROT, ALBUMIN  in the last 168 hours.  No results for input(s): LIPASE, AMYLASE in the last 168 hours.  No results for input(s): AMMONIA in the last 168 hours. Coagulation profile No results for input(s): INR, PROTIME in the last 168 hours.  COVID-19 Labs  No results for input(s): DDIMER, FERRITIN, LDH, CRP in the last 72 hours.  Lab Results  Component Value Date   SARSCOV2NAA NEGATIVE 06/15/2024   SARSCOV2NAA NEGATIVE 08/04/2022    CBC: No results for input(s): WBC, NEUTROABS, HGB, HCT, MCV, PLT in the last 168 hours.  Cardiac Enzymes: No results for input(s): CKTOTAL, CKMB, CKMBINDEX, TROPONINI in the last 168 hours.  BNP (last 3 results) No results for input(s): PROBNP in the last 8760 hours. CBG: No results for input(s): GLUCAP in the last 168 hours. D-Dimer: No results for input(s): DDIMER in the last 72 hours. Hgb A1c: No results for input(s): HGBA1C in the last 72 hours. Lipid Profile: No results for input(s): CHOL, HDL, LDLCALC, TRIG, CHOLHDL, LDLDIRECT in the last 72 hours. Thyroid  function studies: No results for input(s): TSH, T4TOTAL,  T3FREE, THYROIDAB in the last 72 hours.  Invalid input(s): FREET3 Anemia work up: No results for input(s): VITAMINB12, FOLATE, FERRITIN, TIBC, IRON, RETICCTPCT in the last 72 hours. Sepsis Labs: No results for input(s): PROCALCITON, WBC, LATICACIDVEN in the last 168 hours.  Microbiology No results found for this or any previous visit (from the past 240 hours).    Medications:    acetaminophen   1,000 mg Oral Q8H   aspirin   81 mg Oral BID   busPIRone   10 mg Oral BID   docusate sodium   100 mg Oral BID   PARoxetine   40 mg Oral QHS   temazepam   30 mg Oral QHS   Continuous Infusions:      LOS: 22 days   Erle Odell Castor  [facility]  07/08/2024, 9:42 AM

## 2024-07-08 NOTE — Progress Notes (Signed)
 Patient ID: Courtney Avila, female   DOB: Jul 18, 1940, 84 y.o.   MRN: [id number] It has now been 3 weeks since the patient had a right hip replacement to treat her significant displaced right hip fracture.  She has not walked any since then and keeps her legs flexed and contracted.  Her right hip dressing is removed today and the staples are intact.  Incision looks good.  We will put an order for nursing to remove the staples.  No dressing is needed at this point.

## 2024-07-09 DIAGNOSIS — S065XAA Traumatic subdural hemorrhage with loss of consciousness status unknown, initial encounter: Secondary | ICD-10-CM | POA: Diagnosis not present

## 2024-07-09 NOTE — Progress Notes (Signed)
 [facility] PROGRESS NOTE    Progress Note  Courtney Avila  FMW:[id number] DOB: 1941-03-31 DOA: 06/15/2024 PCP: [name], NP     Brief Narrative:   Courtney Avila is an 84 y.o. 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  Neurosurgery was consulted recommended conservative management. Awaiting skilled nursing facility placement. Assessment/Plan:   Subdural hematoma (HCC) 2 CT scan 8 hours apart showed stable subdural hematoma. Discussed with neurosurgery recommended no further treatment they recommended to initiate DVT prophylaxis for hip surgery on 06/17/2024. Denied placement by insurance.  Family appealing. 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 Narcotics and DVT prophylaxis per orthopedic doctor. Orthopedic surgery recommended to keep knee immobilizer on to keep the leg straight short-term. Aspirin  twice a day for DVT prophylaxis. Staples have been discontinued.  Unwitnessed fall: Etiology unclear with a history of dementia history is limited. Difficult to place.  Postop acute blood loss anemia: Hemoglobin postoperatively dropped to 6.1. Transfused 2 units packed red blood cells. Hemoglobin this morning is 9.2.  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 Has been calm throughout her hospital stay no agitation.   Insomnia: Continue Restoril .  Anxiety and mood disorder: Continue BuSpar .  Oral intake: On D5.  Goals of care: She is currently DNR/DNI.  Concern for UTI: UA was unremarkable. Blood cultures remain negative till date she completed a 3-day course of  IV antibiotics in house.  1/2 blood cultures positive for staph epi: Likely contaminant.   DVT prophylaxis: scd Family Communication:none Status is: Inpatient Remains inpatient appropriate because: Right hip fracture    Code Status:     Code Status Orders  (From admission, onward)           Start     Ordered   06/16/24 0049  Do not attempt resuscitation (DNR)- Limited -Do Not Intubate (DNI)  Continuous       Question Answer Comment  If pulseless and not breathing No CPR or chest compressions.   In Pre-Arrest Conditions (Patient Is Breathing and Has A Pulse) Do not intubate. Provide all appropriate non-invasive medical interventions. Avoid ICU transfer unless indicated or required.   Consent: Discussion documented in EHR or advanced directives reviewed      06/16/24 0049           Code Status History     Date Active Date Inactive Code Status Order ID Comments User Context   01/24/2024 1529 06/15/2024 1835 DNR [id number]  [name], NP Outpatient   12/07/2016 1636 12/09/2016 1730 Full Code [id number]  Melodi Lerner, MD Inpatient   04/09/2016 1215 04/10/2016 2031 Full Code [id number]  Garland Sora, PA-C Inpatient         IV Access:   Peripheral IV   Procedures and diagnostic studies:   No results found.    Medical Consultants:   None.   Subjective:    Courtney Avila no complaints  Objective:    Vitals:   07/08/24 1407 07/08/24 1411 07/08/24 1938 07/09/24 0518  BP:  128/78 134/76 112/82  Pulse: 68 69 76 64  Resp:  20 18 18   Temp:  98.1 F (36.7 C) 98.2 F (  36.8 C) 98.6 F (37 C)  TempSrc:  Oral Axillary Oral  SpO2: 100% 98% 97% 100%   SpO2: 100 % O2 Flow Rate (L/min): 3 L/min   Intake/Output Summary (Last 24 hours) at 07/09/2024 1009 Last data filed at 07/09/2024 0530 Gross per 24 hour  Intake 400 ml  Output 700 ml  Net -300 ml    There were no vitals filed for this visit.  Exam: General exam: In no acute  distress. Respiratory system: Good air movement and clear to auscultation. Cardiovascular system: S1 & S2 heard, RRR. No JVD. Gastrointestinal system: Abdomen is nondistended, soft and nontender.  Extremities: No pedal edema. Skin: No rashes, lesions or ulcers Psychiatry: Judgement and insight appear normal. Mood & affect appropriate.  Data Reviewed:    Labs: Basic Metabolic Panel: No results for input(s): NA, K, CL, CO2, GLUCOSE, BUN, CREATININE, CALCIUM, MG, PHOS in the last 168 hours.  GFR CrCl cannot be calculated (Unknown ideal weight.). Liver Function Tests: No results for input(s): AST, ALT, ALKPHOS, BILITOT, PROT, ALBUMIN  in the last 168 hours.  No results for input(s): LIPASE, AMYLASE in the last 168 hours.  No results for input(s): AMMONIA in the last 168 hours. Coagulation profile No results for input(s): INR, PROTIME in the last 168 hours.  COVID-19 Labs  No results for input(s): DDIMER, FERRITIN, LDH, CRP in the last 72 hours.  Lab Results  Component Value Date   SARSCOV2NAA NEGATIVE 06/15/2024   SARSCOV2NAA NEGATIVE 08/04/2022    CBC: No results for input(s): WBC, NEUTROABS, HGB, HCT, MCV, PLT in the last 168 hours.  Cardiac Enzymes: No results for input(s): CKTOTAL, CKMB, CKMBINDEX, TROPONINI in the last 168 hours.  BNP (last 3 results) No results for input(s): PROBNP in the last 8760 hours. CBG: No results for input(s): GLUCAP in the last 168 hours. D-Dimer: No results for input(s): DDIMER in the last 72 hours. Hgb A1c: No results for input(s): HGBA1C in the last 72 hours. Lipid Profile: No results for input(s): CHOL, HDL, LDLCALC, TRIG, CHOLHDL, LDLDIRECT in the last 72 hours. Thyroid  function studies: No results for input(s): TSH, T4TOTAL, T3FREE, THYROIDAB in the last 72 hours.  Invalid input(s): FREET3 Anemia work up: No results for input(s):  VITAMINB12, FOLATE, FERRITIN, TIBC, IRON, RETICCTPCT in the last 72 hours. Sepsis Labs: No results for input(s): PROCALCITON, WBC, LATICACIDVEN in the last 168 hours.  Microbiology No results found for this or any previous visit (from the past 240 hours).    Medications:    acetaminophen   1,000 mg Oral Q8H   aspirin   81 mg Oral BID   busPIRone   10 mg Oral BID   docusate sodium   100 mg Oral BID   PARoxetine   40 mg Oral QHS   temazepam   30 mg Oral QHS   Continuous Infusions:      LOS: 23 days   Erle Odell Castor  [facility]  07/09/2024, 10:09 AM

## 2024-07-09 NOTE — Plan of Care (Signed)

## 2024-07-10 DIAGNOSIS — S065XAA Traumatic subdural hemorrhage with loss of consciousness status unknown, initial encounter: Secondary | ICD-10-CM | POA: Diagnosis not present

## 2024-07-10 NOTE — TOC Progression Note (Signed)
 Transition of Care [facility]) - Progression Note    Patient Details  Name: Courtney Avila MRN: [id number] Date of Birth: 02-25-1941  [facility] [address] CM/SW Contact  Doneta Glenys DASEN, RN Phone Number: 07/10/2024, 9:11 AM  Clinical Narrative:    [facility] does not accept patients new insurance [employer].  CM called [name] (son) [phone], left message.   Expected Discharge Plan: Skilled Nursing Facility Barriers to Discharge: Continued Medical Work up, SNF Pending bed offer, Insurance Authorization               Expected Discharge Plan and Services       Living arrangements for the past 2 months: Single Family Home                                       Social Drivers of Health (SDOH) Interventions SDOH Screenings   Food Insecurity: No Food Insecurity (06/17/2024)  Housing: Low Risk (06/27/2024)  Transportation Needs: No Transportation Needs (06/17/2024)  Utilities: Not At Risk (06/17/2024)  Depression (PHQ2-9): Low Risk (05/01/2024)  Social Connections: Socially Isolated (06/17/2024)  Tobacco Use: Low Risk (06/16/2024)    Readmission Risk Interventions     No data to display

## 2024-07-10 NOTE — Plan of Care (Signed)

## 2024-07-10 NOTE — Progress Notes (Signed)
" °   07/10/24 1525  Spiritual Encounters  Type of Visit Initial  Care provided to: Patient  Referral source Nurse (RN/NT/LPN)  Reason for visit Routine spiritual support  OnCall Visit No   I provided spiritual care support to [employer]. Ms Flammer had been calling out and seeming distressed with dementia symptoms. I responded to a page by Allegra at 1423.  Ms Hertzberg received my visit and my gift of a pink dogbone pillow for her to hold. I stayed with her at bedside and used interventions of touch/use of pillow and soft toy someone had left on bed. I used music and sang softly to her. Ms Andaya did continue her vocalizations but also seemed to enjoy my presence and the comforting gestures. Will continue to follow.  Xyon Lukasik L. Delores HERO.Div "

## 2024-07-10 NOTE — Progress Notes (Signed)
 [facility] PROGRESS NOTE    Progress Note  Courtney Avila  FMW:[id number] DOB: 1940-11-08 DOA: 06/15/2024 PCP: [name], NP     Brief Narrative:   Courtney Avila is an 84 y.o. 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  Neurosurgery was consulted recommended conservative management. Awaiting skilled nursing facility placement. Assessment/Plan:   Subdural hematoma (HCC) 2 CT scan 8 hours apart showed stable subdural hematoma. Discussed with neurosurgery recommended no further treatment they recommended to initiate DVT prophylaxis for hip surgery on 06/17/2024. Denied placement by insurance.  Family appealing. 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 Narcotics and DVT prophylaxis per orthopedic doctor. Orthopedic surgery recommended to keep knee immobilizer on to keep the leg straight short-term. Aspirin  twice a day for DVT prophylaxis. Staples have been discontinued.  Unwitnessed fall: Etiology unclear with a history of dementia history is limited. Difficult to place.  Postop acute blood loss anemia: Hemoglobin postoperatively dropped to 6.1. Transfused 2 units packed red blood cells. Hemoglobin this morning is 9.2.  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 Has been calm throughout her hospital stay no agitation.   Insomnia: Continue Restoril .  Anxiety and mood disorder: Continue BuSpar .  Oral intake: On D5.  Goals of care: She is currently DNR/DNI.  Concern for UTI: UA was unremarkable. Blood cultures remain negative till date she completed a 3-day course of  IV antibiotics in house.  1/2 blood cultures positive for staph epi: Likely contaminant.   DVT prophylaxis: scd Family Communication:none Status is: Inpatient Remains inpatient appropriate because: Right hip fracture    Code Status:     Code Status Orders  (From admission, onward)           Start     Ordered   06/16/24 0049  Do not attempt resuscitation (DNR)- Limited -Do Not Intubate (DNI)  Continuous       Question Answer Comment  If pulseless and not breathing No CPR or chest compressions.   In Pre-Arrest Conditions (Patient Is Breathing and Has A Pulse) Do not intubate. Provide all appropriate non-invasive medical interventions. Avoid ICU transfer unless indicated or required.   Consent: Discussion documented in EHR or advanced directives reviewed      06/16/24 0049           Code Status History     Date Active Date Inactive Code Status Order ID Comments User Context   01/24/2024 1529 06/15/2024 1835 DNR [id number]  [name], NP Outpatient   12/07/2016 1636 12/09/2016 1730 Full Code [id number]  Melodi Lerner, MD Inpatient   04/09/2016 1215 04/10/2016 2031 Full Code [id number]  Garland Sora, PA-C Inpatient         IV Access:   Peripheral IV   Procedures and diagnostic studies:   No results found.    Medical Consultants:   None.   Subjective:    Courtney Avila no complaints  Objective:    Vitals:   07/09/24 0518 07/09/24 1246 07/09/24 1924 07/10/24 0553  BP: 112/82 114/66 115/61 125/78  Pulse: 64 73 66 69  Resp: 18 19 17 19   Temp: 98.6 F (37 C) 98.2 F (36.8  C) 98.1 F (36.7 C) 97.9 F (36.6 C)  TempSrc: Oral Oral Axillary Oral  SpO2: 100% 100% 98% 98%   SpO2: 98 % O2 Flow Rate (L/min): 3 L/min   Intake/Output Summary (Last 24 hours) at 07/10/2024 0852 Last data filed at 07/09/2024 1815 Gross per 24 hour  Intake 838 ml  Output --  Net 838 ml    There were no vitals filed for this visit.  Exam: General exam: In no  acute distress. Respiratory system: Good air movement and clear to auscultation. Cardiovascular system: S1 & S2 heard, RRR. No JVD. Gastrointestinal system: Abdomen is nondistended, soft and nontender.  Extremities: No pedal edema. Skin: No rashes, lesions or ulcers Psychiatry: Judgement and insight appear normal. Mood & affect appropriate.  Data Reviewed:    Labs: Basic Metabolic Panel: No results for input(s): NA, K, CL, CO2, GLUCOSE, BUN, CREATININE, CALCIUM, MG, PHOS in the last 168 hours.  GFR CrCl cannot be calculated (Unknown ideal weight.). Liver Function Tests: No results for input(s): AST, ALT, ALKPHOS, BILITOT, PROT, ALBUMIN  in the last 168 hours.  No results for input(s): LIPASE, AMYLASE in the last 168 hours.  No results for input(s): AMMONIA in the last 168 hours. Coagulation profile No results for input(s): INR, PROTIME in the last 168 hours.  COVID-19 Labs  No results for input(s): DDIMER, FERRITIN, LDH, CRP in the last 72 hours.  Lab Results  Component Value Date   SARSCOV2NAA NEGATIVE 06/15/2024   SARSCOV2NAA NEGATIVE 08/04/2022    CBC: No results for input(s): WBC, NEUTROABS, HGB, HCT, MCV, PLT in the last 168 hours.  Cardiac Enzymes: No results for input(s): CKTOTAL, CKMB, CKMBINDEX, TROPONINI in the last 168 hours.  BNP (last 3 results) No results for input(s): PROBNP in the last 8760 hours. CBG: No results for input(s): GLUCAP in the last 168 hours. D-Dimer: No results for input(s): DDIMER in the last 72 hours. Hgb A1c: No results for input(s): HGBA1C in the last 72 hours. Lipid Profile: No results for input(s): CHOL, HDL, LDLCALC, TRIG, CHOLHDL, LDLDIRECT in the last 72 hours. Thyroid  function studies: No results for input(s): TSH, T4TOTAL, T3FREE, THYROIDAB in the last 72 hours.  Invalid input(s): FREET3 Anemia work up: No results for  input(s): VITAMINB12, FOLATE, FERRITIN, TIBC, IRON, RETICCTPCT in the last 72 hours. Sepsis Labs: No results for input(s): PROCALCITON, WBC, LATICACIDVEN in the last 168 hours.  Microbiology No results found for this or any previous visit (from the past 240 hours).    Medications:    acetaminophen   1,000 mg Oral Q8H   aspirin   81 mg Oral BID   busPIRone   10 mg Oral BID   docusate sodium   100 mg Oral BID   PARoxetine   40 mg Oral QHS   temazepam   30 mg Oral QHS   Continuous Infusions:      LOS: 24 days   Erle Odell Castor  [facility]  07/10/2024, 8:52 AM

## 2024-07-11 DIAGNOSIS — S065XAA Traumatic subdural hemorrhage with loss of consciousness status unknown, initial encounter: Secondary | ICD-10-CM | POA: Diagnosis not present

## 2024-07-11 NOTE — Progress Notes (Signed)
" °  Daily Progress Note   Patient Name: NAVIA LINDAHL       Date: 07/11/2024 DOB: [birth year]  Age: 84 y.o. MRN#: [id number] Attending Physician: Odell Celinda Balo, MD Primary Care Physician: [name], NP Admit Date: 06/15/2024 Length of Stay: 25 days  Reason for Consultation/Follow-up: Establishing goals of care  Subjective:   Reviewed EMR including recent documentation from hospitalist and TOC.  Son had been appealing rehab denial though unfortunately this has been upheld.     Presented to bedside to see patient.  No visitors present at bedside.  Patient laying comfortably in bed without signs of distress.  Patient able to interact at times with simple yes or no questions though overall pleasantly confused.  Discussed care with TOC to coordinate care.  Palliative medicine team continuing to follow along with patient's medical journey.  Objective:   Vital Signs:  BP 129/63 (BP Location: Left Leg)   Pulse 66   Temp 97.9 F (36.6 C) (Oral)   Resp (!) 22   SpO2 100%   Physical Exam: General: NAD, awake, pleasantly confused, chronically ill-appearing, cachectic, frail Cardiovascular: RRR Respiratory: no increased work of breathing noted, not in respiratory distress Abdomen: not distended Extremities: Bilateral lower extremity contractures noted Neuro: Pleasantly confused  Assessment & Plan:   Assessment: Patient is an 84 year old female with a past medical history of hypertension, dementia, insomnia, and anxiety and mood disorder who was admitted on 06/15/2024 after an unwitnessed fall that happened 2 days prior to admission.  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  Neurosurgery also consulted and recommended conservative management.  Palliative medicine team consulted to assist with complex medical decision making.  Recommendations/Plan: # Complex  medical decision making/goals of care:  - Patient unable to participate in complex medical decision making due to underlying medical status.  Patient did not appear to be in any distress or discomfort when seen today.   - Unfortunately denial was upheld regarding rehab refusal and so now son asking for TOC's assistance with information regarding LTC placement. [name] recommended to contact multiple LTC facilities regarding placement.  At this time continuing appropriate management.  Patient already appropriately DNR/DNI.  Palliative medicine team continuing to follow with patient's medical journey.  - Patient's son, [name], had been working to get patient to rehab by appealing denial. SNF rehab with palliative is still the plan.    -  Code Status: Limited: Do not attempt resuscitation (DNR) -DNR-LIMITED -Do Not Intubate/DNI   # Psychosocial Support:  GLENWOOD Voncille Barren  # Discharge Planning: To Be Determined  - TOC assisting with coordination of discharge planning  Discussed with: TOC Thank you for allowing the palliative care team to participate in the care Lorinda A Talmadge. Mod MDM Lonia Serve MD.  Palliative Care Provider PMT # [phone]  If patient remains symptomatic despite maximum doses, please call PMT at [phone] between 0700 and 1900. Outside of these hours, please call attending, as PMT does not have night coverage. "

## 2024-07-11 NOTE — Plan of Care (Signed)

## 2024-07-11 NOTE — TOC Progression Note (Addendum)
 Transition of Care [facility]) - Progression Note    Patient Details  Name: [name] MRN: [id number] Date of Birth: [birth year]  Transition of Care [facility]) CM/SW Contact  Doneta Glenys DASEN, RN Phone Number: 07/11/2024, 8:51 AM  Clinical Narrative:  3:42 PM [name] waiting to hear from [facility] for which SNF they have contract with. [name] is to call me by the end of day with decision. 8:51 AM   CM has reached out to [name] son- waiting for a reply.   Expected Discharge Plan: Skilled Nursing Facility Barriers to Discharge: Continued Medical Work up, SNF Pending bed offer, Insurance Authorization               Expected Discharge Plan and Services       Living arrangements for the past 2 months: Single Family Home                                       Social Drivers of Health (SDOH) Interventions SDOH Screenings   Food Insecurity: No Food Insecurity (06/17/2024)  Housing: Low Risk (06/27/2024)  Transportation Needs: No Transportation Needs (06/17/2024)  Utilities: Not At Risk (06/17/2024)  Depression (PHQ2-9): Low Risk (05/01/2024)  Social Connections: Socially Isolated (06/17/2024)  Tobacco Use: Low Risk (06/16/2024)    Readmission Risk Interventions     No data to display

## 2024-07-11 NOTE — Progress Notes (Signed)
" °   07/11/24 1415  Spiritual Encounters  Type of Visit Follow up  Care provided to: Patient  Referral source Chaplain assessment  Reason for visit Routine spiritual support  OnCall Visit No   I provided brief follow up support to Courtney Avila. She was more restful at this time. I checked in with staff to assess further needs including support for staff. Let them know of ongoing spiritual care support for all.  Boruch Manuele L. Delores HERO.Div "

## 2024-07-11 NOTE — Progress Notes (Signed)
 [facility] PROGRESS NOTE    Progress Note  Courtney Avila  FMW:[id number] DOB: 12/28/1940 DOA: 06/15/2024 PCP: [name], NP     Brief Narrative:   Courtney Avila is an 84 y.o. 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  Neurosurgery was consulted recommended conservative management. Awaiting skilled nursing facility placement. Assessment/Plan:   Subdural hematoma (HCC) 2 CT scan 8 hours apart showed stable subdural hematoma. Discussed with neurosurgery recommended no further treatment they recommended to initiate DVT prophylaxis for hip surgery on 06/17/2024. Denied placement by insurance.  Family appealing. 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 Narcotics and DVT prophylaxis per orthopedic doctor. Orthopedic surgery recommended to keep knee immobilizer on to keep the leg straight short-term. Aspirin  twice a day for DVT prophylaxis. Staples have been discontinued.  Unwitnessed fall: Etiology unclear with a history of dementia history is limited. Difficult to place.  Postop acute blood loss anemia: Hemoglobin postoperatively dropped to 6.1. Transfused 2 units packed red blood cells. Hemoglobin this morning is 9.2.  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 Has been calm throughout her hospital stay no agitation.   Insomnia: Continue Restoril .  Anxiety and mood disorder: Continue BuSpar .  Oral intake: On D5.  Goals of care: She is currently DNR/DNI.  Concern for UTI: UA was unremarkable. Blood cultures remain negative till date she completed a 3-day course of  IV antibiotics in house.  1/2 blood cultures positive for staph epi: Likely contaminant.   DVT prophylaxis: scd Family Communication:none Status is: Inpatient Remains inpatient appropriate because: Right hip fracture    Code Status:     Code Status Orders  (From admission, onward)           Start     Ordered   06/16/24 0049  Do not attempt resuscitation (DNR)- Limited -Do Not Intubate (DNI)  Continuous       Question Answer Comment  If pulseless and not breathing No CPR or chest compressions.   In Pre-Arrest Conditions (Patient Is Breathing and Has A Pulse) Do not intubate. Provide all appropriate non-invasive medical interventions. Avoid ICU transfer unless indicated or required.   Consent: Discussion documented in EHR or advanced directives reviewed      06/16/24 0049           Code Status History     Date Active Date Inactive Code Status Order ID Comments User Context   01/24/2024 1529 06/15/2024 1835 DNR [id number]  [name], NP Outpatient   12/07/2016 1636 12/09/2016 1730 Full Code [id number]  Melodi Lerner, MD Inpatient   04/09/2016 1215 04/10/2016 2031 Full Code [id number]  Garland Sora, PA-C Inpatient         IV Access:   Peripheral IV   Procedures and diagnostic studies:   No results found.    Medical Consultants:   None.   Subjective:    Courtney Avila no complaints  Objective:    Vitals:   07/10/24 2033 07/10/24 2100 07/11/24 0433 07/11/24 0611  BP: (!) 81/53 105/60 114/71 124/69  Pulse: 63  61 64  Resp: 18   16  Temp: 98.1 F (36.7 C)  97.9 F (36.6 C)  TempSrc:    Oral  SpO2: 93%   98%   SpO2: 98 % O2 Flow Rate (L/min): 3 L/min   Intake/Output Summary (Last 24 hours) at 07/11/2024 1013 Last data filed at 07/11/2024 0908 Gross per 24 hour  Intake 120 ml  Output --  Net 120 ml    There were no vitals filed for this visit.  Exam: General exam: In no acute distress. Respiratory system: Good air movement and  clear to auscultation. Cardiovascular system: S1 & S2 heard, RRR. No JVD. Gastrointestinal system: Abdomen is nondistended, soft and nontender.  Extremities: No pedal edema. Skin: No rashes, lesions or ulcers Psychiatry: Judgement and insight appear normal. Mood & affect appropriate.  Data Reviewed:    Labs: Basic Metabolic Panel: No results for input(s): NA, K, CL, CO2, GLUCOSE, BUN, CREATININE, CALCIUM, MG, PHOS in the last 168 hours.  GFR CrCl cannot be calculated (Unknown ideal weight.). Liver Function Tests: No results for input(s): AST, ALT, ALKPHOS, BILITOT, PROT, ALBUMIN  in the last 168 hours.  No results for input(s): LIPASE, AMYLASE in the last 168 hours.  No results for input(s): AMMONIA in the last 168 hours. Coagulation profile No results for input(s): INR, PROTIME in the last 168 hours.  COVID-19 Labs  No results for input(s): DDIMER, FERRITIN, LDH, CRP in the last 72 hours.  Lab Results  Component Value Date   SARSCOV2NAA NEGATIVE 06/15/2024   SARSCOV2NAA NEGATIVE 08/04/2022    CBC: No results for input(s): WBC, NEUTROABS, HGB, HCT, MCV, PLT in the last 168 hours.  Cardiac Enzymes: No results for input(s): CKTOTAL, CKMB, CKMBINDEX, TROPONINI in the last 168 hours.  BNP (last 3 results) No results for input(s): PROBNP in the last 8760 hours. CBG: No results for input(s): GLUCAP in the last 168 hours. D-Dimer: No results for input(s): DDIMER in the last 72 hours. Hgb A1c: No results for input(s): HGBA1C in the last 72 hours. Lipid Profile: No results for input(s): CHOL, HDL, LDLCALC, TRIG, CHOLHDL, LDLDIRECT in the last 72 hours. Thyroid  function studies: No results for input(s): TSH, T4TOTAL, T3FREE, THYROIDAB in the last 72 hours.  Invalid input(s): FREET3 Anemia work up: No results for input(s): VITAMINB12, FOLATE, FERRITIN, TIBC, IRON,  RETICCTPCT in the last 72 hours. Sepsis Labs: No results for input(s): PROCALCITON, WBC, LATICACIDVEN in the last 168 hours.  Microbiology No results found for this or any previous visit (from the past 240 hours).    Medications:    acetaminophen   1,000 mg Oral Q8H   aspirin   81 mg Oral BID   busPIRone   10 mg Oral BID   docusate sodium   100 mg Oral BID   PARoxetine   40 mg Oral QHS   temazepam   30 mg Oral QHS   Continuous Infusions:      LOS: 25 days   Erle Odell Castor  [facility]  07/11/2024, 10:13 AM

## 2024-07-12 DIAGNOSIS — S72141G Displaced intertrochanteric fracture of right femur, subsequent encounter for closed fracture with delayed healing: Secondary | ICD-10-CM | POA: Diagnosis not present

## 2024-07-12 DIAGNOSIS — F02C0 Dementia in other diseases classified elsewhere, severe, without behavioral disturbance, psychotic disturbance, mood disturbance, and anxiety: Secondary | ICD-10-CM | POA: Diagnosis not present

## 2024-07-12 DIAGNOSIS — K219 Gastro-esophageal reflux disease without esophagitis: Secondary | ICD-10-CM | POA: Diagnosis not present

## 2024-07-12 DIAGNOSIS — G309 Alzheimer's disease, unspecified: Secondary | ICD-10-CM | POA: Diagnosis not present

## 2024-07-12 DIAGNOSIS — S065XAA Traumatic subdural hemorrhage with loss of consciousness status unknown, initial encounter: Secondary | ICD-10-CM | POA: Diagnosis not present

## 2024-07-12 DIAGNOSIS — S72141A Displaced intertrochanteric fracture of right femur, initial encounter for closed fracture: Secondary | ICD-10-CM | POA: Diagnosis not present

## 2024-07-12 DIAGNOSIS — S72041A Displaced fracture of base of neck of right femur, initial encounter for closed fracture: Secondary | ICD-10-CM | POA: Diagnosis not present

## 2024-07-12 DIAGNOSIS — S32010A Wedge compression fracture of first lumbar vertebra, initial encounter for closed fracture: Secondary | ICD-10-CM | POA: Diagnosis not present

## 2024-07-12 NOTE — Progress Notes (Addendum)
 "                                                                                                                                                                                                          Daily Progress Note   Patient Name: Courtney Avila       Date: 07/12/2024 DOB: 1940-09-23  Age: 84 y.o. MRN#: [id number] Attending Physician: Kathrin Mignon DASEN, MD Primary Care Physician: [name], NP Admit Date: 06/15/2024  Reason for Consultation/Follow-up: Establishing goals of care  Subjective:  Resting in bed Has contractures Breakfast tray at bedside about 10-20% done, patient requires assistance with feeding.   Length of Stay: 26  Current Medications: Scheduled Meds:   acetaminophen   1,000 mg Oral Q8H   aspirin   81 mg Oral BID   busPIRone   10 mg Oral BID   docusate sodium   100 mg Oral BID   PARoxetine   40 mg Oral QHS   temazepam   30 mg Oral QHS    Continuous Infusions:   PRN Meds: acetaminophen  **OR** acetaminophen , HYDROmorphone  (DILAUDID ) injection, menthol  **OR** phenol, methocarbamol  **OR** [DISCONTINUED] methocarbamol  (ROBAXIN ) injection, ondansetron  **OR** ondansetron  (ZOFRAN ) IV, oxyCODONE   Physical Exam         Frail appearing elderly lady resting in bed No acute distress Has contractures Shallow regular breath sounds  Vital Signs: BP (!) 99/58 (BP Location: Left Arm)   Pulse 61   Temp 97.9 F (36.6 C) (Oral)   Resp 16   Ht 4' 11 (1.499 m)   Wt 48.9 kg   SpO2 99%   BMI 21.77 kg/m  SpO2: SpO2: 99 % O2 Device: O2 Device: Room Air O2 Flow Rate: O2 Flow Rate (L/min): 3 L/min  Intake/output summary:  Intake/Output Summary (Last 24 hours) at 07/12/2024 1425 Last data filed at 07/12/2024 1200 Gross per 24 hour  Intake 120 ml  Output --  Net 120 ml   LBM: Last BM Date : 07/10/24 Baseline Weight: Weight: 48.9 kg Most recent weight: Weight: 48.9 kg       Palliative Assessment/Data:      Patient Active Problem List   Diagnosis Date Noted    Palliative care encounter 07/07/2024   Counseling and coordination of care 07/07/2024   Closed displaced intertrochanteric fracture of right femur (HCC) 07/07/2024   Dementia (HCC) 06/16/2024   GERD (gastroesophageal reflux disease) 06/16/2024   Closed 2-part intertrochanteric fracture of proximal end of femur with delayed healing, right 06/16/2024   Closed compression fracture of body of L1 vertebra (HCC) 06/16/2024   Subdural hematoma (  HCC) 06/16/2024   Displaced fracture of base of neck of right femur, initial encounter for closed fracture (HCC) 06/16/2024   OA (osteoarthritis) of knee 12/07/2016   S/P reverse total shoulder arthroplasty, 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  Plan guidance as care needs involved.  Previously discussed with [facility] colleague on 1-6/26 with regards to scope of current hospitalization.  TOC working with son with regards to skilled nursing facility placement based on your insurance.  Recommend palliative support in the outpatient setting for continued goals of care discussions and for continued assistance with complex medical decision making in the outpatient setting.   Code Status:    Code Status Orders  (From admission, onward)           Start     Ordered   06/16/24 0049  Do not attempt resuscitation (DNR)- Limited -Do Not Intubate (DNI)  Continuous       Question Answer Comment  If  pulseless and not breathing No CPR or chest compressions.   In Pre-Arrest Conditions (Patient Is Breathing and Has A Pulse) Do not intubate. Provide all appropriate non-invasive medical interventions. Avoid ICU transfer unless indicated or required.   Consent: Discussion documented in EHR or advanced directives reviewed      06/16/24 0049           Code Status History     Date Active Date Inactive Code Status Order ID Comments User Context   01/24/2024 1529 06/15/2024 1835 DNR [id number]  [name], NP Outpatient   12/07/2016 1636 12/09/2016 1730 Full Code [id number]  Melodi Lerner, MD Inpatient   04/09/2016 1215 04/10/2016 2031 Full Code [id number]  Garland Randine RIGGERS Inpatient       Prognosis:  Unable to determine  Discharge Planning: Skilled Nursing Facility for rehab with Palliative care service follow-up  Care plan was discussed with  IDT Addendum: Call placed and discussed with son [name], discussed with him about SNF rehab - patient being turned down, likely not rehab candidate. We then talked about home with hospice versus residential hospice. Patient at times with minimal Po intake and remains on both PO and IV opioids, hence a discussion was held about whether to proceed with residential hospice evaluation. Will request residential hospice evaluation for symptom management. Also discussed about long term care facilities with son [name] - to the best of my ability.  PMT to follow.  Thank you for allowing the Palliative Medicine Team to assist in the care of this patient. I personally spent a total of  50 minutes in the care of the patient today including preparing to see the patient, getting/reviewing separately obtained history, performing a medically appropriate exam/evaluation, referring and communicating with other health care professionals, documenting clinical information in the EHR, and coordinating care.      Greater than 50%  of this time was spent counseling and  coordinating care related to the above assessment and plan.  Lonia Serve, MD  Please contact Palliative Medicine Team phone at [phone] for questions and concerns.       "

## 2024-07-12 NOTE — Plan of Care (Signed)

## 2024-07-12 NOTE — TOC Progression Note (Addendum)
 Transition of Care [facility]) - Progression Note    Patient Details  Name: ARISBETH PURRINGTON MRN: [id number] Date of Birth: 1941-06-03  Transition of Care [facility]) CM/SW Contact  Doneta Glenys DASEN, RN Phone Number: 07/12/2024, 8:44 AM  Clinical Narrative:    3:19 PM CM called Myra Estes for update with [employer].Left message 12:04 PM [name] return call sent updated insurance information [employer]- Member ID- [id number] Plan [employer] Dual Full [address] (HMO D-SNP). [phone].  CM sent information to Financial to update in EPIC.  8:44 AM CM attempted to reach Darinson-left message.   Expected Discharge Plan: Skilled Nursing Facility Barriers to Discharge: Continued Medical Work up, SNF Pending bed offer, Insurance Authorization               Expected Discharge Plan and Services       Living arrangements for the past 2 months: Single Family Home                                       Social Drivers of Health (SDOH) Interventions SDOH Screenings   Food Insecurity: No Food Insecurity (06/17/2024)  Housing: Low Risk (06/27/2024)  Transportation Needs: No Transportation Needs (06/17/2024)  Utilities: Not At Risk (06/17/2024)  Depression (PHQ2-9): Low Risk (05/01/2024)  Social Connections: Socially Isolated (06/17/2024)  Tobacco Use: Low Risk (06/16/2024)    Readmission Risk Interventions     No data to display

## 2024-07-12 NOTE — Progress Notes (Signed)
 " PROGRESS NOTE  Courtney Avila FMW:[id number] DOB: 1941/05/12   PCP: [name], NP  Patient is from: Home.  DOA: 06/15/2024 LOS: 26  Chief complaints Chief Complaint  Patient presents with   uti     Brief Narrative / Interim history: 84 year old with hypertension, dementia presented with unwitnessed fall, found to have subdural hematoma and right hip fracture.  Underwent right hip arthroplasty 12/12.  Neurosurgery recommended conservative management for subdural. Not progressing well.  Initially, insurance denied SNF.  Family appealed.  Now TOC of family working on getting patient to [place] facility.  Palliative medicine following.  Subjective: Seen and examined earlier this morning.  No major events overnight or this morning.  Sleepy this morning.  Assessment and plan: Closed right hip fracture: Traumatic due to fall. -S/p Total hip arthroplasty 12/12, Dr. Vernetta -Aspirin  81 mg twice daily for DVT prophylaxis -Multimodal pain control with scheduled Tylenol  and as needed oxycodone  and IV Dilaudid . -Weightbearing as tolerated -Therapy recommended SNF.   Acute blood loss anemia due to surgical blood loss: Received blood transfusions.  Relatively stable. -Monitor intermittently.  Subdural hematoma: Repeat CT on 12/12 with stable mixed density left SDH - NSGY recommended conservative management.  Positive blood culture: Blood culture on 12/1 with Staph hominis and epidermidis in 1 out of 2 bottles likely contaminant.  Repeat blood culture on 12/13 negative.  Severe dementia with behavioral disturbances: Oriented to self for most part.  Appears anxious. - Reorientation and delirium precaution  - Restoril , BuSpar  and Paxil . -Will give 1 dose of Ativan . - Palliative consulted for goal of care.  Failure to thrive: Gradually declining. Body mass index is 21.77 kg/m. - Palliative consulted for goal of care        Pressure skin injury Wound 06/18/24 1820  Pressure Injury Buttocks Right;Medial;Mid Deep Tissue Pressure Injury - Purple or maroon localized area of discolored intact skin or blood-filled blister due to damage of underlying soft tissue from pressure and/or shear. (Active)     Wound 06/18/24 1857 Pressure Injury Heel Right Stage 1 -  Intact skin with non-blanchable redness of a localized area usually over a bony prominence. (Active)     Wound 07/11/24 0630 Pressure Injury Toe (Comment  which one) Right;Medial Deep Tissue Pressure Injury - Purple or maroon localized area of discolored intact skin or blood-filled blister due to damage of underlying soft tissue from pressure and/or shear. (Active)   DVT prophylaxis:  SCDs Start: 06/16/24 2152 SCDs Start: 06/16/24 0734  Code Status: DNR Family Communication: None at bedside Level of care: Med-Surg Status is: Inpatient Remains inpatient appropriate because: Lack of safe disposition   Final disposition: SNF?   25 minutes with more than 50% spent in reviewing records, counseling patient/family and coordinating care.  Consultants:  Orthopedic surgery Palliative medicine  Procedures: 12/12-right THA  Microbiology summarized: 12/11-COVID-19, influenza and RSV PCR nonreactive 12/11-blood culture with Staph hominis and epidermidis in 1 out of 2 bottles. 12/13-repeat blood cultures NGTD.  Objective: Vitals:   07/11/24 0611 07/11/24 1320 07/11/24 2046 07/12/24 0552  BP: 124/69 129/63  (!) 99/58  Pulse: 64 66  61  Resp: 16 (!) 22  16  Temp: 97.9 F (36.6 C) 97.9 F (36.6 C)  97.9 F (36.6 C)  TempSrc: Oral Oral  Oral  SpO2: 98% 100%  99%  Weight:   48.9 kg   Height:   4' 11 (1.499 m)     Examination:  GENERAL: No apparent distress.  Appears frail and  weak. HEENT: MMM.  Vision and hearing grossly intact.  NECK: Supple.  No apparent JVD.  RESP:  No IWOB.  Fair aeration bilaterally. CVS:  RRR.  2/6 SEM over RUSB and LUSB. ABD/GI/GU: BS+. Abd soft, NTND.  MSK/EXT:   Significant muscle mass and subcu fat loss. SKIN: Dressing over right hip DTI. NEURO: Sleepy.  Sch Meds:  Scheduled Meds:  acetaminophen   1,000 mg Oral Q8H   aspirin   81 mg Oral BID   busPIRone   10 mg Oral BID   docusate sodium   100 mg Oral BID   PARoxetine   40 mg Oral QHS   temazepam   30 mg Oral QHS   Continuous Infusions: PRN Meds:.acetaminophen  **OR** acetaminophen , HYDROmorphone  (DILAUDID ) injection, menthol  **OR** phenol, methocarbamol  **OR** [DISCONTINUED] methocarbamol  (ROBAXIN ) injection, ondansetron  **OR** ondansetron  (ZOFRAN ) IV, oxyCODONE   Antimicrobials: Anti-infectives (From admission, onward)    Start     Dose/Rate Route Frequency Ordered Stop   06/22/24 1000  sulfamethoxazole -trimethoprim  (BACTRIM  DS) 800-160 MG per tablet 1 tablet  Status:  Discontinued        1 tablet Oral Every 12 hours 06/22/24 0859 06/24/24 1147   06/19/24 2200  sulfamethoxazole -trimethoprim  (BACTRIM  DS) 800-160 MG per tablet 1 tablet  Status:  Discontinued        1 tablet Oral Every 12 hours 06/19/24 1706 06/22/24 0859   06/19/24 0000  sulfamethoxazole -trimethoprim  (BACTRIM  DS) 800-160 MG tablet  Status:  Discontinued        1 tablet Oral Every 12 hours 06/19/24 1707 06/24/24    06/16/24 2245  ceFAZolin  (ANCEF ) IVPB 2g/100 mL premix        2 g 200 mL/hr over 30 Minutes Intravenous Every 6 hours 06/16/24 2152 06/17/24 1406   06/16/24 1245  ceFAZolin  (ANCEF ) IVPB 2g/100 mL premix        2 g 200 mL/hr over 30 Minutes Intravenous On call to O.R. 06/16/24 1230 06/16/24 1450   06/16/24 1231  ceFAZolin  (ANCEF ) 2-4 GM/100ML-% IVPB       Note to Pharmacy: [name] D: cabinet override      06/16/24 1231 06/16/24 1458   06/16/24 1000  cefTRIAXone  (ROCEPHIN ) 1 g in sodium chloride  0.9 % 100 mL IVPB        1 g 200 mL/hr over 30 Minutes Intravenous Every 24 hours 06/16/24 0938 06/18/24 1238        I have personally reviewed the following labs and images: CBC: No results for input(s):  WBC, NEUTROABS, HGB, HCT, MCV, PLT in the last 168 hours.  BMP &GFR No results for input(s): NA, K, CL, CO2, GLUCOSE, BUN, CREATININE, CALCIUM, MG, PHOS in the last 168 hours.  Invalid input(s): GFRCG  Estimated Creatinine Clearance: 35.1 mL/min (by C-G formula based on SCr of 0.52 mg/dL). Liver & Pancreas: No results for input(s): AST, ALT, ALKPHOS, BILITOT, PROT, ALBUMIN  in the last 168 hours.  No results for input(s): LIPASE, AMYLASE in the last 168 hours. No results for input(s): AMMONIA in the last 168 hours. Diabetic: No results for input(s): HGBA1C in the last 72 hours. No results for input(s): GLUCAP in the last 168 hours. Cardiac Enzymes: No results for input(s): CKTOTAL, CKMB, CKMBINDEX, TROPONINI in the last 168 hours. No results for input(s): PROBNP in the last 8760 hours. Coagulation Profile: No results for input(s): INR, PROTIME in the last 168 hours. Thyroid  Function Tests: No results for input(s): TSH, T4TOTAL, FREET4, T3FREE, THYROIDAB in the last 72 hours. Lipid Profile: No results for input(s): CHOL, HDL, LDLCALC, TRIG, CHOLHDL,  LDLDIRECT in the last 72 hours. Anemia Panel: No results for input(s): VITAMINB12, FOLATE, FERRITIN, TIBC, IRON, RETICCTPCT in the last 72 hours. Urine analysis:    Component Value Date/Time   COLORURINE YELLOW 06/15/2024 2033   APPEARANCEUR HAZY (A) 06/15/2024 2033   LABSPEC 1.029 06/15/2024 2033   PHURINE 5.0 06/15/2024 2033   GLUCOSEU NEGATIVE 06/15/2024 2033   HGBUR NEGATIVE 06/15/2024 2033   BILIRUBINUR NEGATIVE 06/15/2024 2033   KETONESUR NEGATIVE 06/15/2024 2033   PROTEINUR 30 (A) 06/15/2024 2033   NITRITE NEGATIVE 06/15/2024 2033   LEUKOCYTESUR NEGATIVE 06/15/2024 2033   Sepsis Labs: Invalid input(s): PROCALCITONIN, LACTICIDVEN  Microbiology: No results found for this or any previous visit (from the past 240  hours).  Radiology Studies: No results found.    Joely Losier T. Adream Parzych [facility]  If 7PM-7AM, please contact night-coverage [email or web address] 07/12/2024, 4:28 PM   "

## 2024-07-13 DIAGNOSIS — S065XAA Traumatic subdural hemorrhage with loss of consciousness status unknown, initial encounter: Secondary | ICD-10-CM | POA: Diagnosis not present

## 2024-07-13 LAB — GLUCOSE, CAPILLARY: Glucose-Capillary: 84 mg/dL (ref 70–99)

## 2024-07-13 MED ORDER — LORAZEPAM 2 MG/ML IJ SOLN
0.5000 mg | Freq: Once | INTRAMUSCULAR | Status: AC
  Administered 2024-07-13: 0.5 mg via INTRAVENOUS
  Filled 2024-07-13: qty 1

## 2024-07-13 NOTE — Progress Notes (Signed)
 "                                                                                                                                                                                                          Daily Progress Note   Patient Name: Courtney Avila       Date: 07/13/2024 DOB: [birth year]  Age: 84 y.o. MRN#: [id number] Attending Physician: Kathrin Mignon DASEN, MD Primary Care Physician: [name], NP Admit Date: 06/15/2024  Reason for Consultation/Follow-up: Establishing goals of care  Subjective: Patient is awake alert, she opens her eyes and interacts reasonably appropriately.  She tracks me in the room.  She requires assistance with feeding.  At present, lunch tray at bedside, patient states that she would like to try some mashed potatoes, discussed with NT colleague and appreciate their assistance in helping feed the patient    Length of Stay: 27  Current Medications: Scheduled Meds:   acetaminophen   1,000 mg Oral Q8H   aspirin   81 mg Oral BID   busPIRone   10 mg Oral BID   docusate sodium   100 mg Oral BID   PARoxetine   40 mg Oral QHS   temazepam   30 mg Oral QHS    Continuous Infusions:   PRN Meds: acetaminophen  **OR** acetaminophen , HYDROmorphone  (DILAUDID ) injection, menthol  **OR** phenol, methocarbamol  **OR** [DISCONTINUED] methocarbamol  (ROBAXIN ) injection, ondansetron  **OR** ondansetron  (ZOFRAN ) IV, oxyCODONE   Physical Exam         Frail appearing elderly lady resting in bed No acute distress Has contractures Shallow regular breath sounds Awake alert, answers questions appropriately Follows commands  Vital Signs: BP (!) 112/55 (BP Location: Right Arm)   Pulse 60   Temp 98.1 F (36.7 C) (Axillary)   Resp 15   Ht 4' 11 (1.499 m)   Wt 48.9 kg   SpO2 100%   BMI 21.77 kg/m  SpO2: SpO2: 100 % O2 Device: O2 Device: Room Air O2 Flow Rate: O2 Flow Rate (L/min): 3 L/min  Intake/output summary:  Intake/Output Summary (Last 24 hours) at 07/13/2024 1220 Last data  filed at 07/12/2024 1851 Gross per 24 hour  Intake 120 ml  Output --  Net 120 ml   LBM: Last BM Date : 07/12/24 Baseline Weight: Weight: 48.9 kg Most recent weight: Weight: 48.9 kg       Palliative Assessment/Data:      Patient Active Problem List   Diagnosis Date Noted   Palliative care encounter 07/07/2024   Counseling and coordination of care 07/07/2024   Closed displaced intertrochanteric fracture of right femur (  HCC) 07/07/2024   Dementia (HCC) 06/16/2024   GERD (gastroesophageal reflux disease) 06/16/2024   Closed 2-part intertrochanteric fracture of proximal end of femur with delayed healing, right 06/16/2024   Closed compression fracture of body of L1 vertebra (HCC) 06/16/2024   Subdural hematoma (HCC) 06/16/2024   Displaced fracture of base of neck of right femur, initial encounter for closed fracture (HCC) 06/16/2024   OA (osteoarthritis) of knee 12/07/2016   S/P reverse total shoulder arthroplasty, 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  Plan guidance as care needs involved.  Previously discussed with [facility] colleague on 1-6/26 with regards to scope of current hospitalization.  TOC working with son with regards to skilled nursing facility placement based on your insurance.  Recommend palliative support in the outpatient setting for continued goals of care discussions and for continued assistance with complex  medical decision making in the outpatient setting.  07-13-2024 Likely has a reasonable oral intake but requires assistance with feeding TOC note reviewed, under consideration for [facility] skilled nursing facility pending bed offer-recommend continued outpatient palliative support.   Code Status:    Code Status Orders  (From admission, onward)           Start     Ordered   06/16/24 0049  Do not attempt resuscitation (DNR)- Limited -Do Not Intubate (DNI)  Continuous       Question Answer Comment  If pulseless and not breathing No CPR or chest compressions.   In Pre-Arrest Conditions (Patient Is Breathing and Has A Pulse) Do not intubate. Provide all appropriate non-invasive medical interventions. Avoid ICU transfer unless indicated or required.   Consent: Discussion documented in EHR or advanced directives reviewed      06/16/24 0049           Code Status History     Date Active Date Inactive Code Status Order ID Comments User Context   01/24/2024 1529 06/15/2024 1835 DNR [id number]  [name], NP Outpatient   12/07/2016 1636 12/09/2016 1730 Full Code [id number]  Melodi Lerner, MD Inpatient   04/09/2016 1215 04/10/2016 2031 Full Code [id number]  Garland Randine RIGGERS Inpatient       Prognosis:  Unable to determine  Discharge Planning: Skilled Nursing Facility for rehab with Palliative care service follow-up  Care plan was discussed with  IDT  Thank you for allowing the Palliative Medicine Team to assist in the care of this patient. I personally spent a total of  25 minutes in the care of the patient today including preparing to see the patient, getting/reviewing separately obtained history, performing a medically appropriate exam/evaluation, referring and communicating with other health care professionals, documenting clinical information in the EHR, and coordinating care.      Greater than 50%  of this time was spent counseling and coordinating care related to the  above assessment and plan.  Lonia Serve, MD  Please contact Palliative Medicine Team phone at [phone] for questions and concerns.       "

## 2024-07-13 NOTE — Plan of Care (Signed)
   Problem: Nutrition: Goal: Adequate nutrition will be maintained Outcome: Progressing   Problem: Elimination: Goal: Will not experience complications related to bowel motility Outcome: Progressing Goal: Will not experience complications related to urinary retention Outcome: Progressing

## 2024-07-13 NOTE — TOC Progression Note (Addendum)
 Transition of Care [facility]) - Progression Note    Patient Details  Name: Courtney Avila MRN: [id number] Date of Birth: Apr 21, 1941  [facility] of [facility] CM/SW Contact  Courtney Glenys DASEN, RN Phone Number: 07/13/2024, 11:48 AM  Clinical Narrative:   4:36 PM Courtney Avila son returned call stating he is not able to provide care for his mom. He requested a FL2, CM informed him that it is completed and be provided to him. CM informed him that his mother is does not qualify for SNF and offered community resources to assist him with the process for LTC. Courtney Avila requested to speak with palliative provider. CM sent chat to Dr. Jeryl, that Courtney Avila requested a phone call. Courtney Avila would like to speak with a SW that can guide him with the process for LTC. I informed Courtney Avila I have given him all the information I can offer.  2:37 PM CM called Courtney Avila son and left message to inform him that [facility] cannot accept patient for SNF or long term care and he is expected to take patient home.  CM waiting for return call. 11:48 AM CM spoke with Courtney Avila  for placement. Courtney Avila requested referral be sent again via HUB. Information sent.   Expected Discharge Plan: Skilled Nursing Facility Barriers to Discharge: Continued Medical Work up, SNF Pending bed offer, Insurance Authorization               Expected Discharge Plan and Services       Living arrangements for the past 2 months: Single Family Home                                       Social Drivers of Health (SDOH) Interventions SDOH Screenings   Food Insecurity: No Food Insecurity (06/17/2024)  Housing: Low Risk (06/27/2024)  Transportation Needs: No Transportation Needs (06/17/2024)  Utilities: Not At Risk (06/17/2024)  Depression (PHQ2-9): Low Risk (05/01/2024)  Social Connections: Socially Isolated (06/17/2024)  Tobacco Use: Low Risk (06/16/2024)    Readmission Risk Interventions     No data to display

## 2024-07-13 NOTE — Progress Notes (Signed)
 " PROGRESS NOTE  NEA GITTENS FMW:[id number] DOB: Dec 01, 1940   PCP: [name], NP  Patient is from: Home.  DOA: 06/15/2024 LOS: 27  Chief complaints Chief Complaint  Patient presents with   uti     Brief Narrative / Interim history: 84 year old with hypertension, dementia presented with unwitnessed fall, found to have subdural hematoma and right hip fracture.  Underwent right hip arthroplasty 12/12.  Neurosurgery recommended conservative management for subdural. Not progressing well.  Initially, insurance denied SNF.  Now she has new insurance.  TOC working on SNF placement.  Subjective: Seen and examined earlier this morning.  No major events overnight or this morning.  Awake and oriented to self.  Not a reliable historian.  Assessment and plan: Closed right hip fracture: Traumatic due to fall. -S/p Total hip arthroplasty 12/12, Dr. Vernetta -Aspirin  81 mg twice daily for DVT prophylaxis -Multimodal pain control with scheduled Tylenol  and as needed oxycodone  and IV Dilaudid . -Weightbearing as tolerated -Therapy recommended SNF.   Acute blood loss anemia due to surgical blood loss: Received blood transfusions.  Relatively stable. -Monitor intermittently.  Subdural hematoma: Repeat CT on 12/12 with stable mixed density left SDH - NSGY recommended conservative management.  Positive blood culture: Blood culture on 12/1 with Staph hominis and epidermidis in 1 out of 2 bottles likely contaminant.  Repeat blood culture on 12/13 negative.  Severe dementia with behavioral disturbances: Oriented to self for most part.  Appears anxious. - Reorientation and delirium precaution  - Restoril , BuSpar  and Paxil . - Palliative consulted for goal of care.  Failure to thrive:  Body mass index is 21.77 kg/m. - Palliative following.        Pressure skin injury Wound 06/18/24 1820 Pressure Injury Buttocks Right;Medial;Mid Deep Tissue Pressure Injury - Purple or maroon localized  area of discolored intact skin or blood-filled blister due to damage of underlying soft tissue from pressure and/or shear. (Active)     Wound 06/18/24 1857 Pressure Injury Heel Right Stage 1 -  Intact skin with non-blanchable redness of a localized area usually over a bony prominence. (Active)     Wound 07/11/24 0630 Pressure Injury Toe (Comment  which one) Right;Medial Deep Tissue Pressure Injury - Purple or maroon localized area of discolored intact skin or blood-filled blister due to damage of underlying soft tissue from pressure and/or shear. (Active)   DVT prophylaxis:  SCDs Start: 06/16/24 2152  Code Status: DNR Family Communication: None at bedside Level of care: Med-Surg Status is: Inpatient Remains inpatient appropriate because: Waiting on SNF.   Final disposition: SNF   25 minutes with more than 50% spent in reviewing records, counseling patient/family and coordinating care.  Consultants:  Orthopedic surgery Palliative medicine  Procedures: 12/12-right THA  Microbiology summarized: 12/11-COVID-19, influenza and RSV PCR nonreactive 12/11-blood culture with Staph hominis and epidermidis in 1 out of 2 bottles. 12/13-repeat blood cultures NGTD.  Objective: Vitals:   07/12/24 1630 07/12/24 1918 07/13/24 0528 07/13/24 1416  BP: 122/72 (!) 109/54 (!) 112/55 (!) 170/115  Pulse: (!) 58 71 60 72  Resp: 16 16 15 20   Temp: 98.1 F (36.7 C) 98.2 F (36.8 C) 98.1 F (36.7 C) 98.8 F (37.1 C)  TempSrc: Oral Axillary Axillary Oral  SpO2: 100% 100% 100% 97%  Weight:      Height:        Examination:  GENERAL: No apparent distress.  Appears frail and weak. HEENT: MMM.  Vision and hearing grossly intact.  NECK: Supple.  No apparent  JVD.  RESP:  No IWOB.  Fair aeration bilaterally. CVS:  RRR.  2/6 SEM over RUSB and LUSB. ABD/GI/GU: BS+. Abd soft, NTND.  MSK/EXT:  Significant muscle mass and subcu fat loss. SKIN: Dressing over right hip DTI. NEURO: Awake.  Oriented to  self.  Sch Meds:  Scheduled Meds:  acetaminophen   1,000 mg Oral Q8H   aspirin   81 mg Oral BID   busPIRone   10 mg Oral BID   docusate sodium   100 mg Oral BID   PARoxetine   40 mg Oral QHS   temazepam   30 mg Oral QHS   Continuous Infusions: PRN Meds:.acetaminophen  **OR** acetaminophen , HYDROmorphone  (DILAUDID ) injection, menthol  **OR** phenol, methocarbamol  **OR** [DISCONTINUED] methocarbamol  (ROBAXIN ) injection, ondansetron  **OR** ondansetron  (ZOFRAN ) IV, oxyCODONE   Antimicrobials: Anti-infectives (From admission, onward)    Start     Dose/Rate Route Frequency Ordered Stop   06/22/24 1000  sulfamethoxazole -trimethoprim  (BACTRIM  DS) 800-160 MG per tablet 1 tablet  Status:  Discontinued        1 tablet Oral Every 12 hours 06/22/24 0859 06/24/24 1147   06/19/24 2200  sulfamethoxazole -trimethoprim  (BACTRIM  DS) 800-160 MG per tablet 1 tablet  Status:  Discontinued        1 tablet Oral Every 12 hours 06/19/24 1706 06/22/24 0859   06/19/24 0000  sulfamethoxazole -trimethoprim  (BACTRIM  DS) 800-160 MG tablet  Status:  Discontinued        1 tablet Oral Every 12 hours 06/19/24 1707 06/24/24    06/16/24 2245  ceFAZolin  (ANCEF ) IVPB 2g/100 mL premix        2 g 200 mL/hr over 30 Minutes Intravenous Every 6 hours 06/16/24 2152 06/17/24 1406   06/16/24 1245  ceFAZolin  (ANCEF ) IVPB 2g/100 mL premix        2 g 200 mL/hr over 30 Minutes Intravenous On call to O.R. 06/16/24 1230 06/16/24 1450   06/16/24 1231  ceFAZolin  (ANCEF ) 2-4 GM/100ML-% IVPB       Note to Pharmacy: [name] D: cabinet override      06/16/24 1231 06/16/24 1458   06/16/24 1000  cefTRIAXone  (ROCEPHIN ) 1 g in sodium chloride  0.9 % 100 mL IVPB        1 g 200 mL/hr over 30 Minutes Intravenous Every 24 hours 06/16/24 0938 06/18/24 1238        I have personally reviewed the following labs and images: CBC: No results for input(s): WBC, NEUTROABS, HGB, HCT, MCV, PLT in the last 168 hours.  BMP &GFR No results  for input(s): NA, K, CL, CO2, GLUCOSE, BUN, CREATININE, CALCIUM, MG, PHOS in the last 168 hours.  Invalid input(s): GFRCG  Estimated Creatinine Clearance: 35.1 mL/min (by C-G formula based on SCr of 0.52 mg/dL). Liver & Pancreas: No results for input(s): AST, ALT, ALKPHOS, BILITOT, PROT, ALBUMIN  in the last 168 hours.  No results for input(s): LIPASE, AMYLASE in the last 168 hours. No results for input(s): AMMONIA in the last 168 hours. Diabetic: No results for input(s): HGBA1C in the last 72 hours. No results for input(s): GLUCAP in the last 168 hours. Cardiac Enzymes: No results for input(s): CKTOTAL, CKMB, CKMBINDEX, TROPONINI in the last 168 hours. No results for input(s): PROBNP in the last 8760 hours. Coagulation Profile: No results for input(s): INR, PROTIME in the last 168 hours. Thyroid  Function Tests: No results for input(s): TSH, T4TOTAL, FREET4, T3FREE, THYROIDAB in the last 72 hours. Lipid Profile: No results for input(s): CHOL, HDL, LDLCALC, TRIG, CHOLHDL, LDLDIRECT in the last 72 hours. Anemia Panel: No results for  input(s): VITAMINB12, FOLATE, FERRITIN, TIBC, IRON, RETICCTPCT in the last 72 hours. Urine analysis:    Component Value Date/Time   COLORURINE YELLOW 06/15/2024 2033   APPEARANCEUR HAZY (A) 06/15/2024 2033   LABSPEC 1.029 06/15/2024 2033   PHURINE 5.0 06/15/2024 2033   GLUCOSEU NEGATIVE 06/15/2024 2033   HGBUR NEGATIVE 06/15/2024 2033   BILIRUBINUR NEGATIVE 06/15/2024 2033   KETONESUR NEGATIVE 06/15/2024 2033   PROTEINUR 30 (A) 06/15/2024 2033   NITRITE NEGATIVE 06/15/2024 2033   LEUKOCYTESUR NEGATIVE 06/15/2024 2033   Sepsis Labs: Invalid input(s): PROCALCITONIN, LACTICIDVEN  Microbiology: No results found for this or any previous visit (from the past 240 hours).  Radiology Studies: No results found.    Maanvi Lecompte T. Eathen Budreau [facility]  If  7PM-7AM, please contact night-coverage [email or web address] 07/13/2024, 2:24 PM   "

## 2024-07-14 DIAGNOSIS — S065XAA Traumatic subdural hemorrhage with loss of consciousness status unknown, initial encounter: Secondary | ICD-10-CM | POA: Diagnosis not present

## 2024-07-14 MED ORDER — SODIUM CHLORIDE 0.9 % IV BOLUS
500.0000 mL | Freq: Once | INTRAVENOUS | Status: AC
  Administered 2024-07-14: 500 mL via INTRAVENOUS

## 2024-07-14 NOTE — Plan of Care (Signed)
  Problem: Education: Goal: Knowledge of General Education information will improve Description: Including pain rating scale, medication(s)/side effects and non-pharmacologic comfort measures Outcome: Not Progressing   Problem: Health Behavior/Discharge Planning: Goal: Ability to manage health-related needs will improve Outcome: Not Progressing   Problem: Clinical Measurements: Goal: Ability to maintain clinical measurements within normal limits will improve Outcome: Not Progressing Goal: Will remain free from infection Outcome: Not Progressing Goal: Diagnostic test results will improve Outcome: Not Progressing Goal: Respiratory complications will improve Outcome: Not Progressing Goal: Cardiovascular complication will be avoided Outcome: Not Progressing   Problem: Activity: Goal: Risk for activity intolerance will decrease Outcome: Not Progressing   Problem: Nutrition: Goal: Adequate nutrition will be maintained Outcome: Not Progressing   Problem: Coping: Goal: Level of anxiety will decrease Outcome: Not Progressing   Problem: Elimination: Goal: Will not experience complications related to bowel motility Outcome: Not Progressing Goal: Will not experience complications related to urinary retention Outcome: Not Progressing   Problem: Pain Managment: Goal: General experience of comfort will improve and/or be controlled Outcome: Not Progressing   Problem: Safety: Goal: Ability to remain free from injury will improve Outcome: Not Progressing   Problem: Skin Integrity: Goal: Risk for impaired skin integrity will decrease Outcome: Not Progressing   Problem: Education: Goal: Knowledge of the prescribed therapeutic regimen will improve Outcome: Not Progressing Goal: Understanding of discharge needs will improve Outcome: Not Progressing Goal: Individualized Educational Video(s) Outcome: Not Progressing   Problem: Activity: Goal: Ability to avoid complications of  mobility impairment will improve Outcome: Not Progressing Goal: Ability to tolerate increased activity will improve Outcome: Not Progressing   Problem: Clinical Measurements: Goal: Postoperative complications will be avoided or minimized Outcome: Not Progressing   Problem: Pain Management: Goal: Pain level will decrease with appropriate interventions Outcome: Not Progressing   Problem: Skin Integrity: Goal: Will show signs of wound healing Outcome: Not Progressing

## 2024-07-14 NOTE — TOC Progression Note (Signed)
 Transition of Care [facility]) - Progression Note    Patient Details  Name: Courtney Avila MRN: [id number] Date of Birth: September 13, 1940  [facility] CM/SW Contact  Doneta Glenys DASEN, RN Phone Number: 07/14/2024, 3:00 PM  Clinical Narrative:    CM called [facility] to confirm evaluation for inpatient Hospice. [name] agreeable and selected [facility]. CM chatted [facility] to evaluate.   Expected Discharge Plan: Skilled Nursing Facility Barriers to Discharge: Continued Medical Work up, SNF Pending bed offer, Insurance Authorization               Expected Discharge Plan and Services       Living arrangements for the past 2 months: Single Family Home                                       Social Drivers of Health (SDOH) Interventions SDOH Screenings   Food Insecurity: No Food Insecurity (06/17/2024)  Housing: Low Risk (06/27/2024)  Transportation Needs: No Transportation Needs (06/17/2024)  Utilities: Not At Risk (06/17/2024)  Depression (PHQ2-9): Low Risk (05/01/2024)  Social Connections: Socially Isolated (06/17/2024)  Tobacco Use: Low Risk (06/16/2024)    Readmission Risk Interventions     No data to display

## 2024-07-14 NOTE — Progress Notes (Signed)
 [id number] [facility] Liaison Note  Received request from Care Manager for family interest in [facility].  Chart reviewed and spoke with family to confirm interest and explain services.  Family agreeable to transfer tomorrow 07/16/23. Care manager aware.   RN please call report to [facility] at [phone] prior to patient leaving the unit.  Please send signed/completed DNR with patient and leave IV access if possible.  Thank you Courtney Jacobs RN BSN [facility] Liaison  [phone]

## 2024-07-14 NOTE — Progress Notes (Signed)
 "                                                                                                                                                                                                          Daily Progress Note   Patient Name: Courtney Avila       Date: 07/14/2024 DOB: 1940-11-27  Age: 84 y.o. MRN#: [id number] Attending Physician: Kathrin Mignon DASEN, MD Primary Care Physician: [name], NP Admit Date: 06/15/2024  Reason for Consultation/Follow-up: Establishing goals of care  Subjective: Patient is less alert this morning.  She appears to attempted to open her eyes.  She mumbles good morning back at me however does not verbalize much  I discussed with her that I spoke with her son Rogelia on the phone on 07-13-2024 to which the patient does not respond    Length of Stay: 28  Current Medications: Scheduled Meds:   acetaminophen   1,000 mg Oral Q8H   aspirin   81 mg Oral BID   busPIRone   10 mg Oral BID   docusate sodium   100 mg Oral BID   PARoxetine   40 mg Oral QHS   temazepam   30 mg Oral QHS    Continuous Infusions:   PRN Meds: acetaminophen  **OR** acetaminophen , HYDROmorphone  (DILAUDID ) injection, menthol  **OR** phenol, methocarbamol  **OR** [DISCONTINUED] methocarbamol  (ROBAXIN ) injection, ondansetron  **OR** ondansetron  (ZOFRAN ) IV, oxyCODONE   Physical Exam         General-frail appearing elderly lady resting comfortably in bed does not appear to have any nonverbal gestures of distress or discomfort Respiratory-shallow regular respirations breath sounds present bilaterally Musculoskeletal-contractures Neurologic-awakens to some attempts to respond but not as alert and conversant as 07-13-24  Vital Signs: BP (!) 124/59 (BP Location: Right Arm)   Pulse 69   Temp 98.7 F (37.1 C) (Oral)   Resp (!) 22   Ht 4' 11 (1.499 m)   Wt 48.9 kg   SpO2 98%   BMI 21.77 kg/m  SpO2: SpO2: 98 % O2 Device: O2 Device: Room Air O2 Flow Rate: O2 Flow Rate (L/min): 3  L/min  Intake/output summary:  Intake/Output Summary (Last 24 hours) at 07/14/2024 1207 Last data filed at 07/14/2024 9072 Gross per 24 hour  Intake 360 ml  Output --  Net 360 ml   LBM: Last BM Date : 07/13/24 Baseline Weight: Weight: 48.9 kg Most recent weight: Weight: 48.9 kg       Palliative Assessment/Data:      Patient Active Problem List   Diagnosis Date Noted   Palliative care encounter 07/07/2024  Counseling and coordination of care 07/07/2024   Closed displaced intertrochanteric fracture of right femur (HCC) 07/07/2024   Dementia (HCC) 06/16/2024   GERD (gastroesophageal reflux disease) 06/16/2024   Closed 2-part intertrochanteric fracture of proximal end of femur with delayed healing, right 06/16/2024   Closed compression fracture of body of L1 vertebra (HCC) 06/16/2024   Subdural hematoma (HCC) 06/16/2024   Displaced fracture of base of neck of right femur, initial encounter for closed fracture (HCC) 06/16/2024   OA (osteoarthritis) of knee 12/07/2016   S/P reverse total shoulder arthroplasty, 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  Plan guidance as care needs involved.  Previously discussed with [facility] colleague on 1-6/26 with regards to scope of current hospitalization.  TOC working with son with regards to skilled nursing facility placement based on your insurance.  Recommend palliative support in the  outpatient setting for continued goals of care discussions and for continued assistance with complex medical decision making in the outpatient setting.  I received correspondence from [facility] colleagues to reach out to the son on 07-13-2024.  Discussed with him about patient's current condition.  Patient overall with ongoing decline and escalating symptom burden and minimal oral intake.  In the last 24 hours she has required scheduled Tylenol , scheduled aspirin , 3 doses of IV Dilaudid  0.5 mg as well as 2 doses of 5 mg each of oral oxycodone . Care coordination: As per discussions with son Rogelia, residential hospice evaluation has been requested.    Code Status:    Code Status Orders  (From admission, onward)           Start     Ordered   06/16/24 0049  Do not attempt resuscitation (DNR)- Limited -Do Not Intubate (DNI)  Continuous       Question Answer Comment  If pulseless and not breathing No CPR or chest compressions.   In Pre-Arrest Conditions (Patient Is Breathing and Has A Pulse) Do not intubate. Provide all appropriate non-invasive medical interventions. Avoid ICU transfer unless indicated or required.   Consent: Discussion documented in EHR or advanced directives reviewed      06/16/24 0049           Code Status History     Date Active Date Inactive Code Status Order ID Comments User Context   01/24/2024 1529 06/15/2024 1835 DNR [id number]  [name], NP Outpatient   12/07/2016 1636 12/09/2016 1730 Full Code [id number]  Melodi Lerner, MD Inpatient   04/09/2016 1215 04/10/2016 2031 Full Code [id number]  Garland Randine RIGGERS Inpatient       Prognosis: Guarded.  Could be less than 2 weeks if the patient has ongoing decline, minimal to near oral intake and escalating symptom burden  Discharge Planning: To be determined.  Residential hospice evaluation has been requested.  Care plan was discussed with  IDT Discussed with son Rogelia on the phone in the evening on  07-13-2024 Thank you for allowing the Palliative Medicine Team to assist in the care of this patient. I personally spent a total of  35 minutes in the care of the patient today including preparing to see the patient, getting/reviewing separately obtained history, performing a medically appropriate exam/evaluation, referring and communicating with other health care professionals, documenting clinical information in the EHR, and coordinating care.      Greater than 50%  of this time was spent counseling and coordinating care related to the above assessment and plan.  Lonia Serve, MD  Please contact Palliative Medicine Team phone at [phone] for questions and concerns.       "

## 2024-07-14 NOTE — Progress Notes (Signed)
 " PROGRESS NOTE  Courtney Avila FMW:[id number] DOB: Jun 25, 1941   PCP: [name], NP  Patient is from: Home.  DOA: 06/15/2024 LOS: 28  Chief complaints Chief Complaint  Patient presents with   uti     Brief Narrative / Interim history: 84 year old with hypertension, dementia presented with unwitnessed fall, found to have subdural hematoma and right hip fracture.  Underwent right hip arthroplasty 12/12.  Neurosurgery recommended conservative management for subdural. Not progressing well.  She has been declining.  SNF declined by insurance last month.  Palliative medicine following.  Now son considering residential hospice.   Subjective: Seen and examined earlier this morning.  No major events overnight or this morning.  Awake but not answering orientation questions.   Assessment and plan: Closed right hip fracture: Traumatic due to fall. -S/p Total hip arthroplasty 12/12, Dr. Vernetta -Aspirin  81 mg twice daily for DVT prophylaxis -Continue Tylenol , oxycodone  and IV Dilaudid  for pain.  -Palliative and TOC working on disposition   Acute blood loss anemia due to surgical blood loss: Received blood transfusions.  Relatively stable. -Monitor intermittently.  Subdural hematoma: Repeat CT on 12/12 with stable mixed density left SDH - NSGY recommended conservative management.  Positive blood culture: Blood culture on 12/1 with Staph hominis and epidermidis in 1 out of 2 bottles likely contaminant.  Repeat blood culture on 12/13 negative.  Severe dementia with behavioral disturbances: Awake at times.  Oriented to self at most. - Reorientation and delirium precaution  - Restoril , BuSpar  and Paxil . - Palliative following.  Failure to thrive: DNR.  Per palliative, son is considering residential hospice. Body mass index is 21.77 kg/m. - Palliative following.        Pressure skin injury Wound 06/18/24 1820 Pressure Injury Buttocks Right;Medial;Mid Deep Tissue Pressure  Injury - Purple or maroon localized area of discolored intact skin or blood-filled blister due to damage of underlying soft tissue from pressure and/or shear. (Active)     Wound 06/18/24 1857 Pressure Injury Heel Right Stage 1 -  Intact skin with non-blanchable redness of a localized area usually over a bony prominence. (Active)     Wound 07/11/24 0630 Pressure Injury Toe (Comment  which one) Right;Medial Deep Tissue Pressure Injury - Purple or maroon localized area of discolored intact skin or blood-filled blister due to damage of underlying soft tissue from pressure and/or shear. (Active)   DVT prophylaxis:  SCDs Start: 06/16/24 2152  Code Status: DNR Family Communication: None at bedside Level of care: Med-Surg Status is: Inpatient Remains inpatient appropriate because: Safe disposition.   Final disposition: Residential hospice?   25 minutes with more than 50% spent in reviewing records, counseling patient/family and coordinating care.  Consultants:  Orthopedic surgery Palliative medicine  Procedures: 12/12-right THA  Microbiology summarized: 12/11-COVID-19, influenza and RSV PCR nonreactive 12/11-blood culture with Staph hominis and epidermidis in 1 out of 2 bottles. 12/13-repeat blood cultures NGTD.  Objective: Vitals:   07/13/24 1432 07/13/24 1958 07/14/24 0045 07/14/24 0427  BP: (!) 120/54 (!) 121/105 112/83 (!) 124/59  Pulse: 68 (!) 59 65 69  Resp:  16  (!) 22  Temp:  98.2 F (36.8 C)  98.7 F (37.1 C)  TempSrc:  Oral  Oral  SpO2:  97% 98% 98%  Weight:      Height:        Examination:  GENERAL: No apparent distress.  Appears frail and weak. HEENT: MMM.  Vision and hearing grossly intact.  NECK: Supple.  No apparent JVD.  RESP:  No IWOB.  Fair aeration bilaterally. CVS:  RRR.  2/6 SEM over RUSB and LUSB. ABD/GI/GU: BS+. Abd soft, NTND.  MSK/EXT:  Significant muscle mass and subcu fat loss. SKIN: Dressing over right hip DTI. NEURO: Awake.  Oriented to  self.  Sch Meds:  Scheduled Meds:  acetaminophen   1,000 mg Oral Q8H   aspirin   81 mg Oral BID   busPIRone   10 mg Oral BID   docusate sodium   100 mg Oral BID   PARoxetine   40 mg Oral QHS   temazepam   30 mg Oral QHS   Continuous Infusions: PRN Meds:.acetaminophen  **OR** acetaminophen , HYDROmorphone  (DILAUDID ) injection, menthol  **OR** phenol, methocarbamol  **OR** [DISCONTINUED] methocarbamol  (ROBAXIN ) injection, ondansetron  **OR** ondansetron  (ZOFRAN ) IV, oxyCODONE   Antimicrobials: Anti-infectives (From admission, onward)    Start     Dose/Rate Route Frequency Ordered Stop   06/22/24 1000  sulfamethoxazole -trimethoprim  (BACTRIM  DS) 800-160 MG per tablet 1 tablet  Status:  Discontinued        1 tablet Oral Every 12 hours 06/22/24 0859 06/24/24 1147   06/19/24 2200  sulfamethoxazole -trimethoprim  (BACTRIM  DS) 800-160 MG per tablet 1 tablet  Status:  Discontinued        1 tablet Oral Every 12 hours 06/19/24 1706 06/22/24 0859   06/19/24 0000  sulfamethoxazole -trimethoprim  (BACTRIM  DS) 800-160 MG tablet  Status:  Discontinued        1 tablet Oral Every 12 hours 06/19/24 1707 06/24/24    06/16/24 2245  ceFAZolin  (ANCEF ) IVPB 2g/100 mL premix        2 g 200 mL/hr over 30 Minutes Intravenous Every 6 hours 06/16/24 2152 06/17/24 1406   06/16/24 1245  ceFAZolin  (ANCEF ) IVPB 2g/100 mL premix        2 g 200 mL/hr over 30 Minutes Intravenous On call to O.R. 06/16/24 1230 06/16/24 1450   06/16/24 1231  ceFAZolin  (ANCEF ) 2-4 GM/100ML-% IVPB       Note to Pharmacy: [name] D: cabinet override      06/16/24 1231 06/16/24 1458   06/16/24 1000  cefTRIAXone  (ROCEPHIN ) 1 g in sodium chloride  0.9 % 100 mL IVPB        1 g 200 mL/hr over 30 Minutes Intravenous Every 24 hours 06/16/24 0938 06/18/24 1238        I have personally reviewed the following labs and images: CBC: No results for input(s): WBC, NEUTROABS, HGB, HCT, MCV, PLT in the last 168 hours.  BMP &GFR No results  for input(s): NA, K, CL, CO2, GLUCOSE, BUN, CREATININE, CALCIUM, MG, PHOS in the last 168 hours.  Invalid input(s): GFRCG  Estimated Creatinine Clearance: 35.1 mL/min (by C-G formula based on SCr of 0.52 mg/dL). Liver & Pancreas: No results for input(s): AST, ALT, ALKPHOS, BILITOT, PROT, ALBUMIN  in the last 168 hours.  No results for input(s): LIPASE, AMYLASE in the last 168 hours. No results for input(s): AMMONIA in the last 168 hours. Diabetic: No results for input(s): HGBA1C in the last 72 hours. Recent Labs  Lab 07/13/24 2201  GLUCAP 84   Cardiac Enzymes: No results for input(s): CKTOTAL, CKMB, CKMBINDEX, TROPONINI in the last 168 hours. No results for input(s): PROBNP in the last 8760 hours. Coagulation Profile: No results for input(s): INR, PROTIME in the last 168 hours. Thyroid  Function Tests: No results for input(s): TSH, T4TOTAL, FREET4, T3FREE, THYROIDAB in the last 72 hours. Lipid Profile: No results for input(s): CHOL, HDL, LDLCALC, TRIG, CHOLHDL, LDLDIRECT in the last 72 hours. Anemia Panel: No results for input(s):  VITAMINB12, FOLATE, FERRITIN, TIBC, IRON, RETICCTPCT in the last 72 hours. Urine analysis:    Component Value Date/Time   COLORURINE YELLOW 06/15/2024 2033   APPEARANCEUR HAZY (A) 06/15/2024 2033   LABSPEC 1.029 06/15/2024 2033   PHURINE 5.0 06/15/2024 2033   GLUCOSEU NEGATIVE 06/15/2024 2033   HGBUR NEGATIVE 06/15/2024 2033   BILIRUBINUR NEGATIVE 06/15/2024 2033   KETONESUR NEGATIVE 06/15/2024 2033   PROTEINUR 30 (A) 06/15/2024 2033   NITRITE NEGATIVE 06/15/2024 2033   LEUKOCYTESUR NEGATIVE 06/15/2024 2033   Sepsis Labs: Invalid input(s): PROCALCITONIN, LACTICIDVEN  Microbiology: No results found for this or any previous visit (from the past 240 hours).  Radiology Studies: No results found.    Courtney Avila T. Paislea Hatton [facility]  If 7PM-7AM,  please contact night-coverage [email or web address] 07/14/2024, 1:49 PM   "

## 2024-07-14 NOTE — Plan of Care (Signed)
  Problem: Clinical Measurements: Goal: Respiratory complications will improve Outcome: Progressing Goal: Cardiovascular complication will be avoided Outcome: Progressing   Problem: Coping: Goal: Level of anxiety will decrease Outcome: Progressing   Problem: Elimination: Goal: Will not experience complications related to bowel motility Outcome: Progressing Goal: Will not experience complications related to urinary retention Outcome: Progressing   

## 2024-07-15 DIAGNOSIS — S72141G Displaced intertrochanteric fracture of right femur, subsequent encounter for closed fracture with delayed healing: Secondary | ICD-10-CM | POA: Diagnosis not present

## 2024-07-15 DIAGNOSIS — S32010A Wedge compression fracture of first lumbar vertebra, initial encounter for closed fracture: Secondary | ICD-10-CM | POA: Diagnosis not present

## 2024-07-15 DIAGNOSIS — S72141A Displaced intertrochanteric fracture of right femur, initial encounter for closed fracture: Principal | ICD-10-CM

## 2024-07-15 DIAGNOSIS — K219 Gastro-esophageal reflux disease without esophagitis: Secondary | ICD-10-CM | POA: Diagnosis not present

## 2024-07-15 DIAGNOSIS — S065XAA Traumatic subdural hemorrhage with loss of consciousness status unknown, initial encounter: Secondary | ICD-10-CM | POA: Diagnosis not present

## 2024-07-15 MED ORDER — OXYCODONE HCL 5 MG PO TABS: 5.0000 mg | ORAL_TABLET | ORAL | Status: AC | PRN

## 2024-07-15 MED ORDER — LORAZEPAM 2 MG/ML IJ SOLN
0.5000 mg | Freq: Once | INTRAMUSCULAR | Status: AC
  Administered 2024-07-15: 0.5 mg via INTRAVENOUS
  Filled 2024-07-15: qty 1

## 2024-07-15 MED ORDER — HYDROMORPHONE HCL 1 MG/ML IJ SOLN: 0.5000 mg | INTRAMUSCULAR | Status: AC | PRN

## 2024-07-15 NOTE — Progress Notes (Signed)
 [facility] Room [phone] [facility] Liaison Note for     [facility] is able to accept patient today once consents are complete.   RN staff, you may call report at any time to [facility] @ [phone], room is assigned when report is called.   Please leave IV intact and send completed DNR with patient.   Updated attending and ICM  via Epic chat.  Thank you for the opportunity to participate in this patient's care   Nat Babe, BSN, RN Hospice Nurse Liaison [phone]

## 2024-07-15 NOTE — TOC Progression Note (Signed)
 Transition of Care [facility]) - Progression Note    Patient Details  Name: Courtney Avila MRN: [id number] Date of Birth: 1940/07/18  [facility] of [facility]) CM/SW Contact  Sonda Manuella Quill, RN Phone Number: 07/15/2024, 10:01 AM  Clinical Narrative:    Beatris w/ Tiffany at [facility]; she said consents must be signed before pt can admit; she will make contact w/ family, and hospital liaison will update this RN CM; also spoke w/ receptionist Elyn; she said pt will admit to RM #3.   Expected Discharge Plan: Skilled Nursing Facility Barriers to Discharge: Continued Medical Work up, SNF Pending bed offer, Insurance Authorization               Expected Discharge Plan and Services       Living arrangements for the past 2 months: Single Family Home Expected Discharge Date: 07/15/24                                     Social Drivers of Health (SDOH) Interventions SDOH Screenings   Food Insecurity: No Food Insecurity (06/17/2024)  Housing: Low Risk (06/27/2024)  Transportation Needs: No Transportation Needs (06/17/2024)  Utilities: Not At Risk (06/17/2024)  Depression (PHQ2-9): Low Risk (05/01/2024)  Social Connections: Socially Isolated (06/17/2024)  Tobacco Use: Low Risk (06/16/2024)    Readmission Risk Interventions     No data to display

## 2024-07-15 NOTE — Discharge Summary (Signed)
 "  Physician Discharge Summary  Courtney Avila FMW:[id number] DOB: Nov 05, 1940 DOA: 06/15/2024  PCP: [name], NP  Admit date: 06/15/2024 Discharge date: 07/15/2024  Admitted From: Home Disposition: Residential hospice at [facility] for end-of-life care   Discharge Condition: Stable for transfer. CODE STATUS: DNR     Hospital course 84 year old with hypertension, dementia presented with unwitnessed fall, found to have subdural hematoma and right hip fracture.  Underwent right hip arthroplasty 12/12.  Neurosurgery recommended conservative management for subdural. Not progressing well.  She has been declining.  SNF declined by insurance last month.  Patient continued to decline.  Family decided to transfer patient to residential hospice for end-of-life care.  See individual problem list below for more.   Problems addressed during this hospitalization Closed right hip fracture: Traumatic due to fall. -S/p Total hip arthroplasty 12/12, Dr. Vernetta -Aspirin  81 mg twice daily for DVT prophylaxis -Continue Tylenol , oxycodone  and IV Dilaudid  for pain.  -Patient declined.  Transferred to residential hospice   Acute blood loss anemia due to surgical blood loss: Received blood transfusions.  Relatively stable.   Subdural hematoma: Repeat CT on 12/12 with stable mixed density left SDH - NSGY recommended conservative management.   Positive blood culture: Blood culture on 12/1 with Staph hominis and epidermidis in 1 out of 2 bottles likely contaminant.  Repeat blood culture on 12/13 negative.   Severe dementia with behavioral disturbances: Awake at times.  Oriented to self at most. - Reorientation and delirium precaution  - Restoril , BuSpar  and Paxil .    Failure to thrive: DNR.  Per palliative, son is considering residential hospice. Body mass index is 21.77 kg/m. - Transfer to residential hospice for end-of-life care.   Pressure skin injury -See wound care instruction  below. Wound 06/18/24 1820 Pressure Injury Buttocks Right;Medial;Mid Deep Tissue Pressure Injury - Purple or maroon localized area of discolored intact skin or blood-filled blister due to damage of underlying soft tissue from pressure and/or shear. (Active)     Wound 07/11/24 0630 Pressure Injury Toe (Comment  which one) Medial;Left Deep Tissue Pressure Injury - Purple or maroon localized area of discolored intact skin or blood-filled blister due to damage of underlying soft tissue from pressure and/or shear. (Active)    Consultations: Neurosurgery Orthopedic surgery Palliative medicine  Time spent 35  minutes  Vital signs Vitals:   07/14/24 2024 07/14/24 2126 07/15/24 0241 07/15/24 0259  BP: 124/62 119/60 108/84   Pulse: 69  62 (!) 59  Temp:    97.7 F (36.5 C)  Resp:    18  Height:      Weight:      SpO2: 98%     TempSrc:    Oral  BMI (Calculated):         Discharge exam  GENERAL: No apparent distress.  Appears frail and weak. HEENT: MMM.  Vision and hearing grossly intact.  NECK: Supple.  No apparent JVD.  RESP:  No IWOB.  Fair aeration bilaterally. CVS:  RRR.  2/6 SEM over RUSB and LUSB. ABD/GI/GU: BS+. Abd soft, NTND.  MSK/EXT:  Significant muscle mass and subcu fat loss. SKIN: Dressing over right hip DTI. NEURO: Awake.  Oriented to self.  Discharge Instructions Discharge Instructions     Discharge wound care:   Complete by: As directed    Wound care  Daily      Cleanse sacrum/R buttock with soap and water , dry and apply Xeroform gauze (Lawson [phone]) to wound bed daily. Secure with silicone  foam.  Lift foam daily to replace Xeroform, change foam q3 days and prn soiling.   Cleanse R heel with soap and water , dry and apply silicone foam heel protectors. Place foot in Prevalon boot to offload pressure. Gerlean #[id number]  Reinforce surgical dressing as needed   Increase activity slowly   Complete by: As directed       Allergies as of 07/15/2024   No Known  Allergies      Medication List     STOP taking these medications    ARIPiprazole  2 MG tablet Commonly known as: ABILIFY        TAKE these medications    aspirin  81 MG chewable tablet Chew 1 tablet (81 mg total) by mouth 2 (two) times daily.   busPIRone  10 MG tablet Commonly known as: BUSPAR  Take 1 tablet (10 mg total) by mouth 2 (two) times daily.   cholecalciferol 25 MCG (1000 UNIT) tablet Commonly known as: VITAMIN D3 Take 1,000 Units by mouth daily.   [employer] OMEGA-3 KRILL OIL PO Take 1 tablet by mouth daily.   Ensure Take 237 mLs by mouth daily.   HYDROmorphone  1 MG/ML injection Commonly known as: DILAUDID  Inject 0.5 mLs (0.5 mg total) into the vein every 4 (four) hours as needed for severe pain (pain score 7-10) (for pain not controlled with oral morphine ).   oxyCODONE  5 MG immediate release tablet Commonly known as: Oxy IR/ROXICODONE  Take 1 tablet (5 mg total) by mouth every 4 (four) hours as needed for moderate pain (pain score 4-6).   PARoxetine  40 MG tablet Commonly known as: PAXIL  Take 40 mg by mouth at bedtime.   prazosin  1 MG capsule Commonly known as: MINIPRESS  Take 1 mg by mouth at bedtime.   temazepam  30 MG capsule Commonly known as: RESTORIL  Take 1 capsule (30 mg total) by mouth at bedtime.   TYLENOL  500 MG tablet Generic drug: acetaminophen  Take 500 mg by mouth every 6 (six) hours as needed for mild pain or headache.               Discharge Care Instructions  (From admission, onward)           Start     Ordered   07/15/24 0000  Discharge wound care:       Comments: Wound care  Daily      Cleanse sacrum/R buttock with soap and water , dry and apply Xeroform gauze (Lawson [phone]) to wound bed daily. Secure with silicone foam.  Lift foam daily to replace Xeroform, change foam q3 days and prn soiling.   Cleanse R heel with soap and water , dry and apply silicone foam heel protectors. Place foot in Prevalon boot to offload pressure.  Gerlean #[id number]  Reinforce surgical dressing as needed   07/15/24 0848             Procedures/Studies: 12/12-right total hip arthroplasty by Dr. Vernetta   ECHOCARDIOGRAM COMPLETE Result Date: 06/17/2024    ECHOCARDIOGRAM REPORT   Patient Name:   Courtney Avila Date of Exam: 06/17/2024 Medical Rec #:  [id number]     Height:       59.0 in Accession #:    [id number]    Weight:       99.8 lb Date of Birth:  1940-08-08     BSA:          1.373 m Patient Age:    83 years      BP:  120/57 mmHg Patient Gender: F             HR:           72 bpm. Exam Location:  Inpatient Procedure: 2D Echo, Cardiac Doppler and Color Doppler (Both Spectral and Color            Flow Doppler were utilized during procedure). Indications:    Preoperaive evaluation  History:        Patient has no prior history of Echocardiogram examinations.                 Risk Factors:Hypertension.  Sonographer:    Logan Shove RDCS Referring Phys: 73 CHRISTOPHER Y [facility] IMPRESSIONS  1. Limited study; no subcostal views obtained.  2. Left ventricular ejection fraction, by estimation, is 60 to 65%. The left ventricle has normal function. The left ventricle has no regional wall motion abnormalities. Left ventricular diastolic parameters are consistent with Grade I diastolic dysfunction (impaired relaxation).  3. Right ventricular systolic function is normal. The right ventricular size is normal.  4. The mitral valve is normal in structure. Trivial mitral valve regurgitation. No evidence of mitral stenosis.  5. The aortic valve has an indeterminant number of cusps. Aortic valve regurgitation is trivial. No aortic stenosis is present. Comparison(s): No prior Echocardiogram. FINDINGS  Left Ventricle: Left ventricular ejection fraction, by estimation, is 60 to 65%. The left ventricle has normal function. The left ventricle has no regional wall motion abnormalities. The left ventricular internal cavity size was normal in size. There is  no  left ventricular hypertrophy. Left ventricular diastolic parameters are consistent with Grade I diastolic dysfunction (impaired relaxation). Right Ventricle: The right ventricular size is normal. Right ventricular systolic function is normal. Left Atrium: Left atrial size was normal in size. Right Atrium: Right atrial size was normal in size. Pericardium: There is no evidence of pericardial effusion. Mitral Valve: The mitral valve is normal in structure. Trivial mitral valve regurgitation. No evidence of mitral valve stenosis. Tricuspid Valve: The tricuspid valve is normal in structure. Tricuspid valve regurgitation is trivial. No evidence of tricuspid stenosis. Aortic Valve: The aortic valve has an indeterminant number of cusps. Aortic valve regurgitation is trivial. No aortic stenosis is present. Aortic valve mean gradient measures 6.0 mmHg. Aortic valve peak gradient measures 10.1 mmHg. Pulmonic Valve: The pulmonic valve was not well visualized. Pulmonic valve regurgitation is not visualized. No evidence of pulmonic stenosis. Aorta: The aortic root is normal in size and structure. Venous: The inferior vena cava was not well visualized. IAS/Shunts: The interatrial septum was not well visualized. Additional Comments: Limited study; no subcostal views obtained.  LEFT VENTRICLE PLAX 2D LVIDd:         4.60 cm   Diastology LVIDs:         2.60 cm   LV e' medial:    7.07 cm/s LV PW:         0.90 cm   LV E/e' medial:  11.1 LV IVS:        0.60 cm   LV e' lateral:   7.96 cm/s LVOT diam:     1.70 cm   LV E/e' lateral: 9.9 LV SV:         43 LV SV Index:   31 LVOT Area:     2.27 cm  RIGHT VENTRICLE RV Basal diam:  3.50 cm RV S prime:     10.70 cm/s TAPSE (M-mode): 2.3 cm LEFT ATRIUM  Index        RIGHT ATRIUM           Index LA diam:        3.30 cm 2.40 cm/m   RA Area:     15.40 cm LA Vol (A2C):   47.6 ml 34.68 ml/m  RA Volume:   48.00 ml  34.97 ml/m LA Vol (A4C):   37.9 ml 27.61 ml/m LA Biplane Vol: 44.4 ml  32.35 ml/m  AORTIC VALVE AV Area (Vmax):    1.04 cm AV Area (Vmean):   0.95 cm AV Area (VTI):     1.09 cm AV Vmax:           159.00 cm/s AV Vmean:          118.000 cm/s AV VTI:            0.393 m AV Peak Grad:      10.1 mmHg AV Mean Grad:      6.0 mmHg LVOT Vmax:         73.20 cm/s LVOT Vmean:        49.500 cm/s LVOT VTI:          0.189 m LVOT/AV VTI ratio: 0.48 MITRAL VALVE MV Area (PHT): 4.12 cm     SHUNTS MV Decel Time: 184 msec     Systemic VTI:  0.19 m MV E velocity: 78.50 cm/s   Systemic Diam: 1.70 cm MV A velocity: 108.00 cm/s MV E/A ratio:  0.73 Redell Shallow MD Electronically signed by Redell Shallow MD Signature Date/Time: 06/17/2024/12:30:59 PM    Final    DG HIP UNILAT WITH PELVIS 1V RIGHT Result Date: 06/16/2024 CLINICAL DATA:  Right hip replacement EXAM: DG HIP (WITH OR WITHOUT PELVIS) 1V*R* COMPARISON:  06/16/2024 FINDINGS: Three low resolution intraoperative spot views of the right hip. Total fluoroscopy time was 33 seconds, fluoroscopic dose of 3.7891 mGy. The images demonstrate a right hip replacement with anatomic alignment IMPRESSION: Intraoperative fluoroscopic assistance provided during right hip replacement. Electronically Signed   By: Luke Bun M.D.   On: 06/16/2024 21:14   DG Pelvis Portable Result Date: 06/16/2024 CLINICAL DATA:  Post hip replacement EXAM: DG PORTABLE PELVIS COMPARISON:  06/16/2024 FINDINGS: Interval right hip replacement with intact hardware and normal alignment. Gas in the soft tissues and cutaneous staples. Pubic symphysis appears intact. Moderate retained feces at the rectum IMPRESSION: Status post right hip replacement with expected postsurgical change. Electronically Signed   By: Luke Bun M.D.   On: 06/16/2024 21:13   DG HIP UNILAT WITH PELVIS 2-3 VIEWS RIGHT Result Date: 06/16/2024 CLINICAL DATA:  Elective surgery EXAM: DG HIP (WITH OR WITHOUT PELVIS) 2-3V RIGHT COMPARISON:  06/16/2024 FINDINGS: Three low resolution intraoperative spot  views of the right hip. Total fluoroscopy time was 8 seconds, fluoroscopic dose of 1.7 mGy. The images demonstrate a right intertrochanteric fracture with displacement and angulation. IMPRESSION: Intraoperative fluoroscopic assistance provided during attempted right hip surgery. Electronically Signed   By: Luke Bun M.D.   On: 06/16/2024 21:13   DG C-Arm 1-60 Min-No Report Result Date: 06/16/2024 Fluoroscopy was utilized by the requesting physician.  No radiographic interpretation.   DG C-Arm 1-60 Min-No Report Result Date: 06/16/2024 Fluoroscopy was utilized by the requesting physician.  No radiographic interpretation.   DG C-Arm 1-60 Min-No Report Result Date: 06/16/2024 Fluoroscopy was utilized by the requesting physician.  No radiographic interpretation.   DG C-Arm 1-60 Min-No Report Result Date: 06/16/2024 Fluoroscopy was utilized by the requesting physician.  No radiographic interpretation.   CT HEAD WO CONTRAST ( ) Result Date: 06/16/2024 EXAM: CT HEAD WITHOUT 06/16/2024 08:51:55 AM TECHNIQUE: CT of the head was performed without the administration of intravenous contrast. Automated exposure control, iterative reconstruction, and/or weight based adjustment of the mA/kV was utilized to reduce the radiation dose to as low as reasonably achievable. COMPARISON: Head CT 06/15/2024, Brain MRI 06/23/2022. CLINICAL HISTORY: 84 year old female with recent fall and left side subdural hematoma. FINDINGS: BRAIN AND VENTRICLES: Mixed density left side subdural hematoma including hyperdense blood products on coronal image 26 measures 3 to 4 mm in thickness at most levels, up to 6 mm along the anterior convexity at the site of hyperdense blood, to 5 ml along the posterior convexity. Size and configuration not significantly changed. Stable mild mass effect on the left hemisphere. No significant midline shift (coronal image 29). Stable gray white differentiation. No new intracranial hemorrhage.  Basilar cisterns remain normal. No suspicious intracranial vascular hyperdensity. No evidence of acute infarct. No hydrocephalus. ORBITS: Stable orbit. SINUSES AND MASTOIDS: Visible paranasal sinuses, tympanic cavities and mastoids remain well aerated. SOFT TISSUES AND SKULL: Right anterior frontal convexity broad based scalp hematoma is more apparent now on series 4 image 22. Otherwise stable scalp soft tissues. Mild for age calcified atherosclerosis at the skull base. No acute skull fracture. IMPRESSION: 1. Stable mixed density left subdural hematoma since yesterday (up to 5-6 mm) with mild mass effect and no significant midline shift. 2. No new intracranial abnormality. 3. Right anterior frontal scalp soft tissue injury. Electronically signed by: Helayne Hurst MD 06/16/2024 09:00 AM EST RP Workstation: [id number]   DG HIP UNILAT WITH PELVIS 2-3 VIEWS RIGHT Result Date: 06/16/2024 EXAM: 2 OR MORE VIEW(S) XRAY OF THE RIGHT HIP 06/16/2024 03:34:37 AM COMPARISON: CT ABD/PELVIS 06/15/2024. CLINICAL HISTORY: Intertrochanteric fracture of right hip (HCC). FINDINGS: BONES AND JOINTS: Acute displaced right intertrochanteric femur fracture. Superimposed angulation and displacement. SOFT TISSUES: The soft tissues are unremarkable. IMPRESSION: 1. Acute displaced right intertrochanteric femur fracture with angulation and displacement. Findings better visualized on CT ABD/PELVIS 06/15/2024. Electronically signed by: Morgane Naveau MD 06/16/2024 03:42 AM EST RP Workstation: [id number]   CT Cervical Spine Wo Contrast Result Date: 06/16/2024 EXAM: CT CERVICAL SPINE WITHOUT CONTRAST 06/15/2024 11:51:01 PM TECHNIQUE: CT of the cervical spine was performed without the administration of intravenous contrast. Multiplanar reformatted images are provided for review. Automated exposure control, iterative reconstruction, and/or weight based adjustment of the mA/kV was utilized to reduce the radiation dose to as low as reasonably  achievable. COMPARISON: None available. CLINICAL HISTORY: Neck trauma (Age >= 65y). FINDINGS: CERVICAL SPINE: BONES AND ALIGNMENT: No acute fracture or traumatic malalignment. DEGENERATIVE CHANGES: Mild degenerative changes of the mid cervical spine. SOFT TISSUES: No prevertebral soft tissue swelling. IMPRESSION: 1. No acute abnormality of the cervical spine. Electronically signed by: Pinkie Pebbles MD 06/16/2024 12:01 AM EST RP Workstation: [id number]   CT Head Wo Contrast Result Date: 06/15/2024 EXAM: CT HEAD WITHOUT 06/15/2024 11:51:01 PM TECHNIQUE: CT of the head was performed without the administration of intravenous contrast. Automated exposure control, iterative reconstruction, and/or weight based adjustment of the mA/kV was utilized to reduce the radiation dose to as low as reasonably achievable. COMPARISON: None available. CLINICAL HISTORY: Mental status change, unknown cause. FINDINGS: BRAIN AND VENTRICLES: Mixed density left subdural hematoma, likely acute on chronic, measuring up to 7 mm in thickness (coronal image 46). Global cortical atrophy. Subcortical and periventricular small vessel ischemic changes. No mass effect or midline shift. No evidence of  acute infarct. No hydrocephalus. ORBITS: No acute abnormality. SINUSES AND MASTOIDS: No acute abnormality. SOFT TISSUES AND SKULL: No acute skull fracture. No acute soft tissue abnormality. LIMITATIONS/ARTIFACTS: Motion degraded images. IMPRESSION: 1. Mixed density left subdural hematoma, likely acute on chronic, measuring up to 7 mm in thickness. No midline shift. 2. Critical Value/emergent results were called by telephone at the time of interpretation on 06/15/2024 at 2358 hrs to provider Dr Raford Electronically signed by: Pinkie Pebbles MD 06/15/2024 11:59 PM EST RP Workstation: [id number]   CT CHEST ABDOMEN PELVIS W CONTRAST Result Date: 06/15/2024 EXAM: CT CHEST, ABDOMEN AND PELVIS WITH CONTRAST 06/15/2024 11:51:01 PM TECHNIQUE: CT of the  chest, abdomen and pelvis was performed with the administration of 80 mL of iohexol  (OMNIPAQUE ) 300 MG/ML solution. Multiplanar reformatted images are provided for review. Automated exposure control, iterative reconstruction, and/or weight based adjustment of the mA/kV was utilized to reduce the radiation dose to as low as reasonably achievable. COMPARISON: None available. CLINICAL HISTORY: fall, ams, FINDINGS: CHEST: MEDIASTINUM AND LYMPH NODES: Heart and pericardium are unremarkable. The central airways are clear. No mediastinal, hilar or axillary lymphadenopathy. Mild 3 vessel coronary atherosclerosis. Small hiatal hernia. LUNGS AND PLEURA: No focal consolidation or pulmonary edema. No pleural effusion or pneumothorax. ABDOMEN AND PELVIS: LIVER: The liver is unremarkable. GALLBLADDER AND BILE DUCTS: Gallbladder is unremarkable. No biliary ductal dilatation. SPLEEN: No acute abnormality. PANCREAS: No acute abnormality. ADRENAL GLANDS: No acute abnormality. KIDNEYS, URETERS AND BLADDER: No stones in the kidneys or ureters. No hydronephrosis. No perinephric or periureteral stranding. Urinary bladder is unremarkable. GI AND BOWEL: Stomach demonstrates no acute abnormality. There is no bowel obstruction. Moderate rectal stool burden, suggesting fecal impaction. REPRODUCTIVE ORGANS: Status post hysterectomy. PERITONEUM AND RETROPERITONEUM: No ascites. No free air. VASCULATURE: Aorta is normal in caliber. Mild thoracic aortic atherosclerosis. Mild atherosclerotic calcifications of the aorta and branch vessels. ABDOMINAL AND PELVIS LYMPH NODES: No lymphadenopathy. BONES AND SOFT TISSUES: Mild superior endplate compression fracture deformity at L1 with minimal retropulsion (sagittal image 20), age indeterminate. Grade 2 spondylolisthesis at L5-S1. Mild degenerative changes of the lumbar spine. Intertrochanteric right hip fracture with foreshortening and varus angulation (coronal image 56). No focal soft tissue  abnormality. IMPRESSION: 1. Intertrochanteric right hip fracture, as above. 2. Mild superior endplate compression fracture deformity at L1 with minimal retropulsion, age indeterminate. 3. Moderate rectal stool burden suggesting fecal impaction. Electronically signed by: Pinkie Pebbles MD 06/15/2024 11:55 PM EST RP Workstation: [id number]   DG Knee 2 Views Left Result Date: 06/15/2024 CLINICAL DATA:  Clemens EXAM: LEFT KNEE - 1-2 VIEW; RIGHT KNEE - 1-2 VIEW COMPARISON:  None Available. FINDINGS: Right knee: Frontal and lateral views of the right knee are obtained. Frontal views limited by patient positioning. Three component right knee arthroplasty is identified without evidence of orthopedic hardware failure or loosening. No evidence of acute fracture. Soft tissues are unremarkable. Left knee: Frontal and lateral views of the left knee are obtained. Frontal view is limited by patient positioning. No fracture, subluxation, or dislocation. Mild 3 compartmental osteoarthritis. Soft tissues are unremarkable. IMPRESSION: 1. Limited study due to patient positioning. 2. Unremarkable right knee arthroplasty. 3. Mild 3 compartmental left knee osteoarthritis. No acute fracture. Electronically Signed   By: Ozell Daring M.D.   On: 06/15/2024 22:52   DG Knee 2 Views Right Result Date: 06/15/2024 CLINICAL DATA:  Clemens EXAM: LEFT KNEE - 1-2 VIEW; RIGHT KNEE - 1-2 VIEW COMPARISON:  None Available. FINDINGS: Right knee: Frontal and lateral views of the  right knee are obtained. Frontal views limited by patient positioning. Three component right knee arthroplasty is identified without evidence of orthopedic hardware failure or loosening. No evidence of acute fracture. Soft tissues are unremarkable. Left knee: Frontal and lateral views of the left knee are obtained. Frontal view is limited by patient positioning. No fracture, subluxation, or dislocation. Mild 3 compartmental osteoarthritis. Soft tissues are unremarkable.  IMPRESSION: 1. Limited study due to patient positioning. 2. Unremarkable right knee arthroplasty. 3. Mild 3 compartmental left knee osteoarthritis. No acute fracture. Electronically Signed   By: Ozell Daring M.D.   On: 06/15/2024 22:52   DG Chest Portable 1 View Result Date: 06/15/2024 EXAM: 1 VIEW(S) XRAY OF THE CHEST 06/15/2024 08:00:00 PM COMPARISON: 08/04/2022 CLINICAL HISTORY: AMS FINDINGS: LUNGS AND PLEURA: No focal pulmonary opacity. No pleural effusion. No pneumothorax. HEART AND MEDIASTINUM: No acute abnormality of the cardiac and mediastinal silhouettes. BONES AND SOFT TISSUES: No acute osseous abnormality. IMPRESSION: 1. No acute cardiopulmonary process identified. Electronically signed by: Franky Crease MD 06/15/2024 08:04 PM EST RP Workstation: [id number]       The results of significant diagnostics from this hospitalization (including imaging, microbiology, ancillary and laboratory) are listed below for reference.     Microbiology: No results found for this or any previous visit (from the past 240 hours).   Labs:  CBC: No results for input(s): WBC, NEUTROABS, HGB, HCT, MCV, PLT in the last 168 hours. BMP &GFR No results for input(s): NA, K, CL, CO2, GLUCOSE, BUN, CREATININE, CALCIUM, MG, PHOS in the last 168 hours.  Invalid input(s): GFRCG Estimated Creatinine Clearance: 35.1 mL/min (by C-G formula based on SCr of 0.52 mg/dL). Liver & Pancreas: No results for input(s): AST, ALT, ALKPHOS, BILITOT, PROT, ALBUMIN  in the last 168 hours. No results for input(s): LIPASE, AMYLASE in the last 168 hours. No results for input(s): AMMONIA in the last 168 hours. Diabetic: No results for input(s): HGBA1C in the last 72 hours. Recent Labs  Lab 07/13/24 2201  GLUCAP 84   Cardiac Enzymes: No results for input(s): CKTOTAL, CKMB, CKMBINDEX, TROPONINI in the last 168 hours. No results for input(s): PROBNP in the last  8760 hours. Coagulation Profile: No results for input(s): INR, PROTIME in the last 168 hours. Thyroid  Function Tests: No results for input(s): TSH, T4TOTAL, FREET4, T3FREE, THYROIDAB in the last 72 hours. Lipid Profile: No results for input(s): CHOL, HDL, LDLCALC, TRIG, CHOLHDL, LDLDIRECT in the last 72 hours. Anemia Panel: No results for input(s): VITAMINB12, FOLATE, FERRITIN, TIBC, IRON, RETICCTPCT in the last 72 hours. Urine analysis:    Component Value Date/Time   COLORURINE YELLOW 06/15/2024 2033   APPEARANCEUR HAZY (A) 06/15/2024 2033   LABSPEC 1.029 06/15/2024 2033   PHURINE 5.0 06/15/2024 2033   GLUCOSEU NEGATIVE 06/15/2024 2033   HGBUR NEGATIVE 06/15/2024 2033   BILIRUBINUR NEGATIVE 06/15/2024 2033   KETONESUR NEGATIVE 06/15/2024 2033   PROTEINUR 30 (A) 06/15/2024 2033   NITRITE NEGATIVE 06/15/2024 2033   LEUKOCYTESUR NEGATIVE 06/15/2024 2033   Sepsis Labs: Invalid input(s): PROCALCITONIN, LACTICIDVEN   SIGNED:  Ruchama Kubicek T Hassel Uphoff, MD  [facility] 07/15/2024, 8:49 AM   "

## 2024-07-15 NOTE — Progress Notes (Signed)
 "                                                                                                                                                                                                          Daily Progress Note   Patient Name: Courtney Avila       Date: 07/15/2024 DOB: 1941/03/17  Age: 84 y.o. MRN#: [id number] Attending Physician: Kathrin Mignon DASEN, MD Primary Care Physician: [name], NP Admit Date: 06/15/2024  Reason for Consultation/Follow-up: Establishing goals of care  Subjective: Patient is resting in bed with her eyes closed.  She is mumbling incessantly.  When she opens her eyes she has a look of fear.  She keeps on repeating I do not know.  She is not reassured by redirecting and calm assurance.   Chart reviewed Notified son [name] on the phone just now Discharge summary noted    Length of Stay: 29  Current Medications: Scheduled Meds:   acetaminophen   1,000 mg Oral Q8H   aspirin   81 mg Oral BID   busPIRone   10 mg Oral BID   docusate sodium   100 mg Oral BID   LORazepam   0.5 mg Intravenous Once   PARoxetine   40 mg Oral QHS   temazepam   30 mg Oral QHS    Continuous Infusions:   PRN Meds: acetaminophen  **OR** acetaminophen , HYDROmorphone  (DILAUDID ) injection, menthol  **OR** phenol, methocarbamol  **OR** [DISCONTINUED] methocarbamol  (ROBAXIN ) injection, ondansetron  **OR** ondansetron  (ZOFRAN ) IV, oxyCODONE   Physical Exam         General-frail appearing elderly lady   Resting in bed, does not have nonverbal gestures of distress or discomfort -wincing and grimacing has a look of fear Respiratory-rapid shallow regular respirations breath sounds present bilaterally Musculoskeletal-contractures Neurologic-awake but anxious, confused, in mild to moderate distress  Vital Signs: BP 108/84   Pulse (!) 59   Temp 97.7 F (36.5 C) (Oral)   Resp 18   Ht 4' 11 (1.499 m)   Wt 48.9 kg   SpO2 98%   BMI 21.77 kg/m  SpO2: SpO2: 98 % O2 Device: O2 Device:  Room Air O2 Flow Rate: O2 Flow Rate (L/min): 3 L/min  Intake/output summary:  Intake/Output Summary (Last 24 hours) at 07/15/2024 0956 Last data filed at 07/15/2024 0900 Gross per 24 hour  Intake 600 ml  Output --  Net 600 ml   LBM: Last BM Date : 07/14/24 Baseline Weight: Weight: 48.9 kg Most recent weight: Weight: 48.9 kg       Palliative Assessment/Data:      Patient Active Problem  List   Diagnosis Date Noted   Palliative care encounter 07/07/2024   Counseling and coordination of care 07/07/2024   Closed displaced intertrochanteric fracture of right femur (HCC) 07/07/2024   Dementia (HCC) 06/16/2024   GERD (gastroesophageal reflux disease) 06/16/2024   Closed 2-part intertrochanteric fracture of proximal end of femur with delayed healing, right 06/16/2024   Closed compression fracture of body of L1 vertebra (HCC) 06/16/2024   Subdural hematoma (HCC) 06/16/2024   Displaced fracture of base of neck of right femur, initial encounter for closed fracture (HCC) 06/16/2024   OA (osteoarthritis) of knee 12/07/2016   S/P reverse total shoulder arthroplasty, 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    Patient has been seen and evaluated by hospice liaison.  She has been deemed an appropriate candidate for transfer to residential hospice for symptom management of pain anxiety agitation Patient displays pain anxiety and agitation behaviors.  She is restless, she has a look of fear, she is not able to be redirected.  Discharge summary is noted she is to be transferred to hospice facility  today. Discharge medication list noted Updated son [name] on the phone this a.m. with regards to the patient's condition as well as pending discharge to hospice Will discuss with RN colleague regarding one-time dose of IV Ativan  as well as one-time dose of IV Dilaudid  for symptom management of pain anxiety air hunger and agitation.  1.  Uncontrolled symptoms of pain anxiety agitation and possibly air hunger Use IV opioids and benzodiazepines judiciously 2.Care coordination Patient to be transferred to residential hospice for symptom management updated son on the phone    Code Status:    Code Status Orders  (From admission, onward)           Start     Ordered   06/16/24 0049  Do not attempt resuscitation (DNR)- Limited -Do Not Intubate (DNI)  Continuous       Question Answer Comment  If pulseless and not breathing No CPR or chest compressions.   In Pre-Arrest Conditions (Patient Is Breathing and Has A Pulse) Do not intubate. Provide all appropriate non-invasive medical interventions. Avoid ICU transfer unless indicated or required.   Consent: Discussion documented in EHR or advanced directives reviewed      06/16/24 0049           Code Status History     Date Active Date Inactive Code Status Order ID Comments User Context   01/24/2024 1529 06/15/2024 1835 DNR [id number]  [name], NP Outpatient   12/07/2016 1636 12/09/2016 1730 Full Code [id number]  Melodi Lerner, MD Inpatient   04/09/2016 1215 04/10/2016 2031 Full Code [id number]  Garland Sora, PA-C Inpatient       Prognosis: Days to 1 week or so in my opinion  minimal to near oral intake and escalating symptom burden  Discharge Planning:   Residential hospice    Care plan was discussed with  IDT Discussed with son [name] on the phone. Secure chat conversation with bedside RN.  Thank you for allowing the Palliative Medicine Team to assist in the care of this patient. I personally spent a total of  50 minutes  in  the care of the patient today including preparing to see the patient, getting/reviewing separately obtained history, performing a medically appropriate exam/evaluation, referring and communicating with other health care professionals, documenting clinical information in the EHR, and coordinating care.      Greater than 50%  of this time was spent counseling and coordinating care related to the above assessment and plan.  Lonia Serve, MD  Please contact Palliative Medicine Team phone at [phone] for questions and concerns.       "

## 2024-07-15 NOTE — Plan of Care (Addendum)
 Patient was discharged today to [facility] with hospice care. Patient was transferred by EMS. All personal belongings were taken with the patient. Pt vitals were stable. Report was called in to [place], [place].

## 2024-07-15 NOTE — Plan of Care (Signed)
  Problem: Clinical Measurements: Goal: Respiratory complications will improve Outcome: Progressing Goal: Cardiovascular complication will be avoided Outcome: Progressing   Problem: Activity: Goal: Risk for activity intolerance will decrease Outcome: Progressing   Problem: Coping: Goal: Level of anxiety will decrease Outcome: Progressing   Problem: Elimination: Goal: Will not experience complications related to bowel motility Outcome: Progressing Goal: Will not experience complications related to urinary retention Outcome: Progressing   Problem: Pain Managment: Goal: General experience of comfort will improve and/or be controlled Outcome: Progressing   Problem: Safety: Goal: Ability to remain free from injury will improve Outcome: Progressing   Problem: Skin Integrity: Goal: Risk for impaired skin integrity will decrease Outcome: Progressing

## 2024-07-15 NOTE — TOC Transition Note (Signed)
 Transition of Care [facility]) - Discharge Note   Patient Details  Name: Courtney Avila MRN: [id number] Date of Birth: 10-28-1940  [facility] CM/SW Contact:  Courtney Manuella Quill, RN Phone Number: 07/15/2024, 11:16 AM   Clinical Narrative:    D/C orders received; pt d/c to [facility] w/ transport by [facility]; call report # spoke w/ her son Courtney Avila [phone]); he agreed to d/c plan; [facility] called for transport at 1117; spoke w/ Operator # [id number]; no IP CM needs.   Final next level of care: Hospice Medical Facility Barriers to Discharge: No Barriers Identified   Patient Goals and CMS Choice   CMS Medicare.gov Compare Post Acute Care list provided to:: Patient Represenative (must comment) Choice offered to / list presented to : Adult Children      Discharge Placement                Patient to be transferred to facility by: [facility] Name of family member notified: Courtney Avila (son) [phone] Patient and family notified of of transfer: 07/15/24  Discharge Plan and Services Additional resources added to the After Visit Summary for                  DME Arranged: N/A DME Agency: NA       HH Arranged: NA HH Agency: NA        Social Drivers of Health (SDOH) Interventions SDOH Screenings   Food Insecurity: No Food Insecurity (06/17/2024)  Housing: Low Risk (06/27/2024)  Transportation Needs: No Transportation Needs (06/17/2024)  Utilities: Not At Risk (06/17/2024)  Depression (PHQ2-9): Low Risk (05/01/2024)  Social Connections: Socially Isolated (06/17/2024)  Tobacco Use: Low Risk (06/16/2024)     Readmission Risk Interventions     No data to display

## 2024-07-31 ENCOUNTER — Ambulatory Visit: Payer: Self-pay | Admitting: Nurse Practitioner

## 2025-02-13 ENCOUNTER — Ambulatory Visit: Payer: Self-pay | Admitting: Nurse Practitioner
